# Patient Record
Sex: Female | Born: 1985 | Race: White | Hispanic: No | Marital: Single | Smoking: Never smoker
Health system: Southern US, Community
[De-identification: ages and names within clinical notes are randomized; demographics above are authoritative.]

## PROBLEM LIST (undated history)

## (undated) ENCOUNTER — Inpatient Hospital Stay (HOSPITAL_COMMUNITY): Payer: Self-pay

## (undated) DIAGNOSIS — R42 Dizziness and giddiness: Secondary | ICD-10-CM

## (undated) DIAGNOSIS — G610 Guillain-Barre syndrome: Secondary | ICD-10-CM

## (undated) DIAGNOSIS — E785 Hyperlipidemia, unspecified: Secondary | ICD-10-CM

## (undated) DIAGNOSIS — G43909 Migraine, unspecified, not intractable, without status migrainosus: Secondary | ICD-10-CM

## (undated) DIAGNOSIS — O139 Gestational [pregnancy-induced] hypertension without significant proteinuria, unspecified trimester: Secondary | ICD-10-CM

## (undated) DIAGNOSIS — F418 Other specified anxiety disorders: Secondary | ICD-10-CM

## (undated) HISTORY — DX: Hyperlipidemia, unspecified: E78.5

## (undated) HISTORY — PX: ANKLE SURGERY: SHX546

## (undated) HISTORY — DX: Other specified anxiety disorders: F41.8

## (undated) HISTORY — DX: Migraine, unspecified, not intractable, without status migrainosus: G43.909

## (undated) HISTORY — DX: Gestational (pregnancy-induced) hypertension without significant proteinuria, unspecified trimester: O13.9

## (undated) HISTORY — DX: Guillain-Barre syndrome: G61.0

---
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
## 2005-04-24 ENCOUNTER — Ambulatory Visit: Payer: Self-pay | Admitting: Internal Medicine

## 2006-04-09 ENCOUNTER — Ambulatory Visit: Payer: Self-pay | Admitting: Internal Medicine

## 2007-12-02 DIAGNOSIS — G610 Guillain-Barre syndrome: Secondary | ICD-10-CM

## 2007-12-02 HISTORY — DX: Guillain-Barre syndrome: G61.0

## 2008-01-22 ENCOUNTER — Emergency Department (HOSPITAL_COMMUNITY): Admission: EM | Admit: 2008-01-22 | Discharge: 2008-01-23 | Payer: Self-pay | Admitting: Emergency Medicine

## 2008-01-25 ENCOUNTER — Inpatient Hospital Stay (HOSPITAL_COMMUNITY): Admission: EM | Admit: 2008-01-25 | Discharge: 2008-01-31 | Payer: Self-pay | Admitting: Neurology

## 2008-01-28 ENCOUNTER — Ambulatory Visit: Payer: Self-pay | Admitting: Physical Medicine & Rehabilitation

## 2008-02-02 ENCOUNTER — Encounter: Admission: RE | Admit: 2008-02-02 | Discharge: 2008-03-03 | Payer: Self-pay | Admitting: Neurology

## 2010-06-25 ENCOUNTER — Encounter: Payer: Self-pay | Admitting: Emergency Medicine

## 2010-10-16 NOTE — Discharge Summary (Signed)
[name], [name]              ACCOUNT NO.:  [id number]   MEDICAL RECORD NO.:  [id number]          PATIENT TYPE:  INP   LOCATION:  3007                         FACILITY:  [facility]   PHYSICIAN:  Casimiro Needle L. Reynolds, M.D.DATE OF BIRTH:  1986-02-24   DATE OF ADMISSION:  01/25/2008  DATE OF DISCHARGE:  01/31/2008                               DISCHARGE SUMMARY   ADMISSION DIAGNOSES:  1. Quadriparesis, uncertain etiology, question cervical cord      compression versus Guillain-Barre syndrome.  2. History of migraine.  3. History of gastroesophageal reflux disease.   DISCHARGE DIAGNOSES:  1. 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  2. Postlumbar puncture headache, improved following blood patch      procedure.  3. History of migraine.  4. History of gastroesophageal reflux disease.   CONDITION AT DISCHARGE:  Improved.   DIET:  Regular.   ACTIVITY:  Ad lib, increasing activity slowly.   MEDICATIONS AT DISCHARGE:  1. Yaz as directed.  2. Tylenol No. 3 q.4-6h. p.r.n.  3. Over-the-counter laxative p.r.n.   STUDIES:  1. MRI of the brain performed on January 24, 2008, unremarkable except      for minimal sinus disease.  2. MRI of the cervical spine initially performed on January 24, 2008,      but severely degraded by motion artifact, repeated on January 25, 2008, and demonstrating minimal leftward disk at C5-6 without      significant stenosis and unremarkable cord signal.  3. MRA of the neck performed on January 25, 2008, demonstrating no      stenosis or evidence of dissection within the cervical vasculature.  4. CSF studies on January 25, 2008, 0-1 white blood cells, red blood      cells 1-4, protein normal at 22, glucose normal at 59, and culture      negative.   LABORATORY REVIEW:  CBC on admission:  White count 10.7, hemoglobin  12.7, and platelets 296,000.   CMET on admission normal.  CT on admission  normal.  Sed rate on admission elevated at 35.  White count on January 27, 2008, down to 7.9 and CMET on January 27, 2008, normal.  Heavy metals  negative.   PROCEDURES:  1. Lumbar puncture performed on January 25, 2008, by Dr. Guilford Shi, no      complications.  2. Epidural blood patch performed on January 30, 2008, by Dr. Alfredo Batty,      no complications.   HOSPITAL COURSE:  Please see admission H&P by Dr. Guilford Shi for full  admission details.  Briefly, this is a 25 year old woman who presented  to the emergency department with progressive quadriparesis, which was  treated a few days prior by chiropractic manipulation.  She initially  presented on August 21, was sent out to the ED, then presented on August  23 with worsening symptoms and was admitted.  The initial concern was  regarding a cervical spinal process and she underwent MRI of  the brain  and cervical spine, which demonstrated the above results.  Next, concern  was for Guillain-Barre syndrome, and she underwent an LP as above.  Although her CSF results are not particularly supportive of GBS, it was  felt that this was her diagnosis on clinical grounds, and therapy with  gammaglobulin was initiated.  The patient did much better over the next  few days in terms of her strength, and eventually began to mobilize.  Her mobilization was hampered by the fact she developed postural  headache, which was pretty severe in character and required parenteral  pain medications.  Because of this, she was aggressively hydrated and  eventually underwent a blood patch procedure on August 29, which helped  her headache tremendously.  On the morning of August 30, she was getting  up and ambulating independently, without headache and without  assistance.  She felt that she was moving adequately and getting on and  off to toilet adequately.  She expressed an interest in going home.  It  was felt that she could  perform outpatient therapy, and arrangements  were made for her discharge.   The plan will be to discharge her later today if she is in fact able to  ambulate and feeling well, although discharge may be deferred if she has  any concerns about going home in her present state.   FOLLOWUP:  Outpatient physical and occupational therapy will be arranged  for the patient.  She was asked to call the office at [facility] on February 01, 2008, to arrange follow up with Dr.  Anne Stone and was also advised to follow up with her primary care  physician.      Michael L. Thad Ranger, M.D.  Electronically Signed     MLR/MEDQ  D:  01/31/2008  T:  01/31/2008  Job:  [id number]

## 2010-10-16 NOTE — H&P (Signed)
[name], [name]              ACCOUNT NO.:  [id number]   MEDICAL RECORD NO.:  [id number]          PATIENT TYPE:  EMS   LOCATION:  ED                           [facility]   PHYSICIAN:  Suzzette Righter, DO        DATE OF BIRTH:  [birth year]   DATE OF ADMISSION:  01/24/2008  DATE OF DISCHARGE:                              HISTORY & PHYSICAL   TIME:  1945.   CHIEF COMPLAINT:  Quadriparesis.   HISTORY OF PRESENT ILLNESS:  The patient is a 25 year old Caucasian  female seen in neurological assessment at [facility] emergency  department secondary to quadriparesis.  History is obtained from the  patient, her parents, and review of the electronic medical record.  According to the patient, she was diagnosed with migraine cephalgia  approximately 4 months ago at an outside hospital emergency department.  At that time she had loss of her peripheral vision in the right eye as  well as a right-sided headache.  She indicates that she was referred to  neurologist; however, due to the fact that she did not have any  additional headaches during the time of her 30-day referral she did not  follow up with any neurologist.  Patient subsequently had a second  migraine headache characterized again as right-sided head paine, and  subsequently a bilateral, sharp headache with bilateral visual  disturbance characterized by flashing lights (scintillating scotomas).  This occurred in July of 2009.  She then saw her family physician in  order to obtain a referral for a neurologist.  However, the patient was  referred to a chiropractor.  The patient saw the chiropractor on August  19th for an initial visit and assessment.  At that time, the patient  obtained a series of plain x-rays of her C/T/L spine and pelvis.  She  returned to the chiropractor on August 21, at which time she was treated  with muscle manipulation as well as needless acupuncture.  The patient  denies any high velocity movements of  the neck or back.  She does,  however, indicate that her treatments focussed on her hands as well as  her cervical spine and mid back.  The patient went home after her 9 a.m.  appointment with the chiropractor.  She noted at approximately 1 p.m.,  while using a keyboard, that she had bilateral hand paresthesias.  She  indicated that she had difficulty coordinating her fingers to use a  keyboard while typing.  Symptoms were concerning to her, however the  patient waited to return to the chiropractor on January 22, 2008, to  discuss her new symptoms.  The patient indicates that her chiropractor  reported to her that her symptoms were attributed to toxins being  released from her muscles into her blood stream.  He apparently  indicated to her that she should drink copious amounts of water to  relieve the symptoms.  Later that afternoon the patient noted bilateral  lower extremity paraparesis.  She indicates that she had a difficult  time walking and even fell once.  She then was brought by family  to the  [facility] Emergency Room.  While in the [facility] Emergency Room,  the patient obtained an MRI of the brain and cervical spine.  The MRI of  the brain was essentially unremarkable.  The MRI of the cervical spine  was motion degraded as the patient began to cough, primarily from her  acid reflux while lying in the supine position.  An MRA of the neck was  not obtained due to the patient aborting the study.  Patient remained in  the emergency room for approximately 1 day and was discharged in the  early morning hours of August 22.  The patient subsequently fell at home  and was essentially nonambulatory.  She returned to the Emergency  Department at [facility] on January 24, 2008, accompanied by her  parents.  Neurology was asked to assess the patient and to make further  recommendation.  Neurology was contacted at approximately 7:40 p.m. and  evaluated the patient approximately 10 minutes  later.   PAST MEDICAL HISTORY:  1. Recent diagnosis of migraine cephalgia (common migraine/migraine      with aura).  Patient with a history of 2 headaches since April of      2009.  Both headaches have been aborted in the past with      sumatriptan.  Patient also with tension headaches approximately 2-3      times per week which are aborted with 1-2 tablets of Excedrin      Migraine.  Patient denies any complex migraine associated with any      weakness or sensory changes in her extremities.  2. Irritable bowel syndrome.  3. GERD.   PAST SURGICAL HISTORY:  Left ankle ORIF.   OUTPATIENT MEDICATIONS:  1. Yaz (birth control pill) once daily.  2. Aciphex on a p.r.n. basis.  3. Hyomax-SR on a p.r.n. basis.  4. Sumatriptan p.r.n. headache.  5. Glycopyrrolate p.r.n. nausea.  6. Excedrin Migraine 1-2 tablets p.r.n. tension headache.   ALLERGIES:  Are listed as no known drug allergies.   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  She does not consume illicit drugs or tobacco.  She  does drink alcohol on occasion.  She is independent of activities of  daily living and does not use any assistive devices for ambulation at  baseline.   FAMILY HISTORY:  Denied for a history of migraine cephalgia.  Patient  with multiple sisters and half-sisters.  There is no family history of  any neurological disorder.   LAST MENSTRUAL PERIOD:  December 28, 2007.   REVIEW OF SYSTEMS:  At present time, patient reports a very mild  bifrontal headache.  She denies any vision disturbance.  She does report  bilateral lower extremity weakness.  She denies any pain in her  extremities.  She does report bilateral hand weakness as well.  She  denies nausea or emesis.  She denies any recent flu-like illness.  She  does report one episode of nausea and emesis about 1 week ago, which is  common for the patient given her history of IBS.  She denies bowel or  bladder  incontinence.  She denies saddle anesthesia.  She does report  that she did fall on her bottom, however she did obtain plain films of  her pelvis which were reported as unremarkable.  No chest pain or  shortness of breath is noted.  Patient denies currently having migraine  symptomatology.  PHYSICAL EXAM:  GENERAL:  The patient is awake and alert.  She is  pleasant and cooperative.  She is in no acute distress but is concerned  about her condition.  VITAL SIGNS:  Most recent vital signs from 2025 are temperature 97.8,  blood pressure 132/79, pulse 111, respirations 20, pulse ox is 98% on  room air.  HEENT EXAM:  Patient is normocephalic and atraumatic.  Oral mucosa is  moist.  There are no tongue lacerations present.  There are no carotid  bruits present.  Palpation of the patient's cervical spine does elicit  bilateral lower extremity paresthesias primarily in the lateral and  posterior parts of her thighs.  Additionally, active neck rotation to  the right does elicit bilateral lower extremity paresthesias.  Patient,  however, does have full range of motion with regards to her neck in all  planes.  HEART:  Regular but tachycardic.  No murmurs were noted.  LUNGS:  Clear to auscultation bilaterally.  ABDOMEN:  Soft, nontender, nondistended.  EXTREMITIES:  Without any edema or cyanosis.  SKIN:  Without any evidence of a rash.  There is no ecchymosis on the  patient's back.  There is mild ecchymosis of the left knee attributed to  when the patient fell earlier today.  BACK:  Without any evidence of tenderness on palpation of the patient's  entire lower cervical, thoracic, and lumbar spine.   NEUROLOGICAL EXAM:  MENTAL STATUS:  Patient is awake and alert.  Her  speech is fluent without any dysarthria or dysphagia.  She is an  excellent historian.  She follows all commands appropriately.  CRANIAL NERVE EXAM:  Pupils are equally round and reactive to light  bilaterally.  Extraocular  muscle movements are intact bilaterally.  Left  eye ptosis is noted which may be chronic as it does appear to be present  on the patient's driver's license photo.  There is no nystagmus noted  with extraocular muscle testing.  Facial sensation symmetrically intact  to light touch, pin and temperature sensation.  Muscles of facial  expression are intact bilaterally.  There are no gross hearing deficits.  Palate elevates symmetrically.  Sternocleidomastoid and trapezius  strength are full.  Tongue protrudes midline without any lacerations.  MOTOR EXAM:  There is no resting or action tremor.  There is no pronator  drift.  Shoulder abduction is 5/5 bilaterally.  Elbow flexion is 4/5 on  the left and 4+/5 on the right.  Elbow extension is 4-/5 bilaterally.  Wrist extension is 3/5 bilaterally.  Wrist flexion is 4/5 bilaterally.  Intrinsic hand muscle strength is graded at 3/5 bilaterally.  Abductor  pollicis brevis strength is 3/5 bilaterally.  Finger flexion in the  median distribution is 3/5 and in the ulnar distribution is also 3/5  bilaterally.  Hip flexion is 3/5 bilaterally.  Ankle dorsiflexion is 2/5  bilaterally.  Ankle plantar flexion is 4-/5 bilaterally.  No  fasciculations or spontaneous movements were noted.  REFLEXES:  Biceps, triceps and brachial radialis are 2+/4 bilaterally.  Patellar and Achilles reflexes are absent bilaterally.  Hoffmann's  response is present bilaterally.  SENSORY EXAM:  No anterior or posterior spinal sensory level was noted  with assessment of the patient's chest and abdomen as well as her back.  Sensation to pin stimulation was diminished in the distal upper  extremities as compared to the proximal upper extremities as well as in  the distal lower extremities as compared to the proximal lower  extremities.  Vibratory  sensation was diminished in the bilateral lower  extremities by approximately 20% as compared to the upper extremities.  Light touch was  symmetrically preserved in the bilateral upper and lower  extremities.  CEREBELLAR EXAM:  Finger-to-nose and rapid alternating movements were  intact bilaterally.  There was no dysdiadochokinesia.   LABORATORY ASSESSMENT:  Sed rate was 35, which is slightly elevated, CK  was within normal limits at 59, sodium 137, potassium 4.4, chloride 103,  CO2 24, glucose 96, BUN 7, creatinine 0.67, calcium 9.6.  AST 24, ALT  21, alkaline phosphatase 54.  Hemoglobin 13.9, hematocrit 41.   IMAGING STUDIES:  Patient obtained an MRI of the brain, without  gadolinium enhancement, on January 24, 2008.  A formal report is pending,  however the preliminary report indicates that it is normal.  The study  was personally reviewed and felt to not reflect any intracranial  pathology.  An MRI of the cervical spine was also attempted on January 24, 2008.  This was motion-limited as the patient began to cough when  lying in a supine position.  Preliminary personal evaluation of the  study seems to suggest a posterior disk at C5-C6 as well as C6-C7,  however not all of the sequences are available for viewing and the  images are highly degraded.  Additional imaging studies include a  complete view lumbar spine x-ray which was negative for fracture on  January 24, 2008.  X-ray of the thoracic spine on January 24, 2008, was  also negative for fracture.  CT of the cervical spine and lumbar spines,  without contrast, were reported as no acute process.   IMPRESSION:  1. The patient is a 25 year old Caucasian female with a 48-month      history of common migraine cephalgia (migraine with aura).  Patient      with 2 migraines since diagnosis 4 months ago.  On August 20,      patient was seen by a chiropractor who provided muscle manipulation      at approximately 0900.  By 1300 on that same day the patient noted      tingling, heaviness, and loss of coordination in her bilateral      hands when attempting to type on a  keyboard.  Patient returned to      the chiropractor on August 21 who attributed her symptoms to toxins      being released into her blood stream from the manipulation.      Patient was advised to drink lots of water.  When the patient's      symptoms involved her lower extremities she became concerned and      presented to [facility] emergency department on January 22, 2008.      Patient was noted to have quadriparesis and the inability to      ambulate.  MRI of the brain was obtained which was unremarkable.      MRI of the C-spine was motion degraded.  Patient was discharged      home on August 22 but returned to [facility] after falling on      August 23.  Neurological examination at present reveals awake,      alert, pleasant, cooperative patient with 5/5 shoulder abduction,      4/5 elbow flexion on the left, 4+/5 elbow flexion on the right, 3/5      wrist extension bilaterally, 3/5 intrinsic hand muscles      bilaterally, 3/5 bilateral hip flexion,  2/5 bilateral dorsiflexion,      4-/5 bilateral plantar flexion.  No spinal sensory level is noted      anteriorly or posteriorly.  Absent lower extremity reflexes, 2+/4      bilateral biceps, triceps and brachial radialis reflexes noted.      Positive Hoffmann's bilaterally.  Patient's lower extremity      paresthesia can be reproduced with deep palpation of the C-spine or      active right neck rotation.  Left eye ptosis also noted (question      chronic).  Decreased vibratory sensation in the distal bilateral      lower extremities compared to the upper extremities.  Decreased pin      sensation in distal extremities in the upper and lower extremities      as compared to the proximal.  Possible etiologies include:      a.     Cervical cord compression given upper extremity and lower       extremity involvement.  Absence of  lower extremity reflexes may       be due to relative acuity of the events.      b.     Doubt Horner syndrome  on the left from carotid dissection,       although left eye ptosis is present.      c.     Concomitant multi-level radiculopathy with cervical       myelpathy is less likely.      d.     Guillian-Barre Syndrome, given lower extremity arreflexia,       although reproducibility of symptoms would  suggest otherwise.  2. Migraine cephalgia by history.  Currently patient with minimal     bifrontal headache, typical for her tension headaches.  3. Medical history of gastroesophageal reflux disease and irritable      bowel syndrome.   PLAN:  1. Transfer patient to [facility] as recommended by attending      radiologist (Dr. Azucena Kuba) to facilitate stat MRI of the C-spine and      MRA of the neck.  2. Admit patient to Dr. Anne Hahn.  3. Will premedicate with Phenergan to prevent coughing from acid      reflux when patient is laid in the supine position.  4. Very lengthy discussion with family and patient.  All questions      were answered.  Family was in agreement with the plans to transfer      the patient in order to obtain additional imaging studies.  5. Will follow up with patient following arrival at [facility].  6. Patient may need to have a lumbar puncture to exclude Guillian-      Barre Syndrome if C-spine imaging does not provide the answers.      Suzzette Righter, DO  Electronically Signed     RH/MEDQ  D:  01/24/2008  T:  01/25/2008  Job:  [id number]   cc:   Marlan Palau, M.D.  Fax: [phone]

## 2011-05-09 ENCOUNTER — Ambulatory Visit (INDEPENDENT_AMBULATORY_CARE_PROVIDER_SITE_OTHER): Payer: BC Managed Care – PPO

## 2011-05-09 DIAGNOSIS — J111 Influenza due to unidentified influenza virus with other respiratory manifestations: Secondary | ICD-10-CM

## 2011-05-28 ENCOUNTER — Ambulatory Visit (INDEPENDENT_AMBULATORY_CARE_PROVIDER_SITE_OTHER): Payer: BC Managed Care – PPO

## 2011-05-28 DIAGNOSIS — H60339 Swimmer's ear, unspecified ear: Secondary | ICD-10-CM

## 2011-09-23 ENCOUNTER — Ambulatory Visit: Payer: BC Managed Care – PPO

## 2011-09-23 ENCOUNTER — Ambulatory Visit (INDEPENDENT_AMBULATORY_CARE_PROVIDER_SITE_OTHER): Payer: BC Managed Care – PPO | Admitting: Family Medicine

## 2011-09-23 VITALS — BP 131/86 | HR 100 | Temp 98.9°F | Resp 16 | Ht 67.0 in | Wt 217.8 lb

## 2011-09-23 DIAGNOSIS — IMO0002 Reserved for concepts with insufficient information to code with codable children: Secondary | ICD-10-CM

## 2011-09-23 DIAGNOSIS — M549 Dorsalgia, unspecified: Secondary | ICD-10-CM

## 2011-09-23 MED ORDER — CYCLOBENZAPRINE HCL 5 MG PO TABS: 5.0000 mg | ORAL_TABLET | Freq: Every evening | ORAL | Status: AC | PRN

## 2011-09-23 MED ORDER — NAPROXEN 500 MG PO TABS: 500.0000 mg | ORAL_TABLET | Freq: Two times a day (BID) | ORAL | Status: AC

## 2011-09-23 MED ORDER — TRAMADOL HCL 50 MG PO TABS: 50.0000 mg | ORAL_TABLET | Freq: Three times a day (TID) | ORAL | Status: AC | PRN

## 2011-09-23 NOTE — Progress Notes (Signed)
[facility]:  Office Visit  Chief Complaint:  Chief Complaint  Patient presents with  . Back Pain    2 days    HPI: [name] is a 26 y.o. female who complains of  2 day h/o back pain with radiation, no numbness or tingling or weakness. H/o of being Horticulturist, commercial. Was wearing a 10 lb back leaf blowing devise and was working on yard blowing leaves when occurred. She had a prior h/o back pain 2 weeks ago and this lasted only 6 hrs, this current episode  now this has been 2 days. Tried Ibuproen without relief. Increase movement and ROM worsens pain. No radicular pain, just localized to back. H.o Guillan-Barre. Denies numbness, weakness, tngling, or loss of bowel and bladder function   Past Medical History  Diagnosis Date  . Guillain-Barre    History reviewed. No pertinent past surgical history. History   Social History  . Marital Status: Single    Spouse Name: N/A    Number of Children: N/A  . Years of Education: N/A   Social History Main Topics  . Smoking status: Never Smoker   . Smokeless tobacco: None  . Alcohol Use: No  . Drug Use: No  . Sexually Active: None   Other Topics Concern  . None   Social History Narrative  . None   No family history on file. No Known Allergies Prior to Admission medications   Medication Sig Start Date End Date Taking? Authorizing Provider  cetirizine (ZYRTEC) 10 MG tablet Take 10 mg by mouth daily.   Yes Historical Provider, MD  drospirenone-ethinyl estradiol (YAZ,GIANVI,LORYNA) 3-0.02 MG tablet Take 1 tablet by mouth daily.   Yes Historical Provider, MD  glycopyrrolate (ROBINUL) 2 MG tablet Take 1 mg by mouth daily.   Yes Historical Provider, MD  Multiple Vitamins-Minerals (ADULT GUMMY PO) Take by mouth daily.   Yes Historical Provider, MD  Probiotic Product (PROBIOTIC PO) Take by mouth daily.   Yes Historical Provider, MD     ROS: The patient denies fevers, chills, night sweats, unintentional weight loss, chest  pain, palpitations, wheezing, dyspnea on exertion, nausea, vomiting, abdominal pain, dysuria, hematuria, melena, numbness, weakness, or tingling.+ back pain  All other systems have been reviewed and were otherwise negative with the exception of those mentioned in the HPI and as above.    PHYSICAL EXAM: Filed Vitals:   09/23/11 1656  BP: 131/86  Pulse: 105  Temp: 98.9 F (37.2 C)  Resp: 16   Filed Vitals:   09/23/11 1656  Height: 5\' 7"  (1.702 m)  Weight: 217 lb 12.8 oz (98.793 kg)   Body mass index is 34.11 kg/(m^2).  General: Alert, no acute distress HEENT:  Normocephalic, atraumatic, oropharynx patent.  Cardiovascular:  Regular rate and rhythm, no rubs murmurs or gallops.  No Carotid bruits, radial pulse intact. No pedal edema.  Respiratory: Clear to auscultation bilaterally.  No wheezes, rales, or rhonchi.  No cyanosis, no use of accessory musculature GI: No organomegaly, abdomen is soft and non-tender, positive bowel sounds.  No masses. Skin: No rashes. Neurologic: Facial musculature symmetric. Psychiatric: Patient is appropriate throughout our interaction. Lymphatic: No cervical lymphadenopathy Musculoskeletal: Gait intact. ROM intact, 5/5 stength, 2/2 DTrs of knee and ankles + tender mid back at L-4-5   LABS: No results found for this or any previous visit.   EKG/XRAY:   Primary read interpreted by Dr. Conley Rolls at [facility]. Normal l-spine xrays   ASSESSMENT/PLAN: Encounter Diagnoses  Name Primary?  Marland Kitchen  Back pain Yes  . Back sprain or strain    EA:[id number], Flexeril, and Tramadol Back exercises given F/u in 2-4 weeks if no improvement    Hannia Matchett PHUONG, DO 09/23/2011 5:23 PM

## 2011-09-23 NOTE — Patient Instructions (Signed)
Back Exercise

## 2012-01-10 ENCOUNTER — Other Ambulatory Visit: Payer: Self-pay | Admitting: Family Medicine

## 2014-11-18 ENCOUNTER — Other Ambulatory Visit: Payer: Self-pay | Admitting: Family Medicine

## 2014-11-18 DIAGNOSIS — E049 Nontoxic goiter, unspecified: Secondary | ICD-10-CM

## 2014-11-25 ENCOUNTER — Ambulatory Visit
Admission: RE | Admit: 2014-11-25 | Discharge: 2014-11-25 | Disposition: A | Discharge status: Home / Self Care | Payer: BC Managed Care – PPO | Source: Ambulatory Visit | Attending: Family Medicine | Admitting: Family Medicine

## 2014-11-25 DIAGNOSIS — E049 Nontoxic goiter, unspecified: Secondary | ICD-10-CM

## 2016-07-17 ENCOUNTER — Encounter (HOSPITAL_COMMUNITY): Payer: Self-pay

## 2016-07-17 ENCOUNTER — Emergency Department (HOSPITAL_COMMUNITY): Payer: BC Managed Care – PPO

## 2016-07-17 ENCOUNTER — Emergency Department (HOSPITAL_COMMUNITY)
Admission: EM | Admit: 2016-07-17 | Discharge: 2016-07-18 | Disposition: A | Discharge status: Home / Self Care | Payer: BC Managed Care – PPO | Attending: Emergency Medicine | Admitting: Emergency Medicine

## 2016-07-17 DIAGNOSIS — R200 Anesthesia of skin: Secondary | ICD-10-CM | POA: Diagnosis not present

## 2016-07-17 DIAGNOSIS — J188 Other pneumonia, unspecified organism: Secondary | ICD-10-CM | POA: Insufficient documentation

## 2016-07-17 DIAGNOSIS — R21 Rash and other nonspecific skin eruption: Secondary | ICD-10-CM | POA: Diagnosis not present

## 2016-07-17 DIAGNOSIS — J189 Pneumonia, unspecified organism: Secondary | ICD-10-CM

## 2016-07-17 DIAGNOSIS — R0602 Shortness of breath: Secondary | ICD-10-CM | POA: Diagnosis present

## 2016-07-17 LAB — CBC
HCT: 37.3 % (ref 36.0–46.0)
HEMOGLOBIN: 12.5 g/dL (ref 12.0–15.0)
MCH: 28.9 pg (ref 26.0–34.0)
MCHC: 33.5 g/dL (ref 30.0–36.0)
MCV: 86.1 fL (ref 78.0–100.0)
PLATELETS: 376 10*3/uL (ref 150–400)
RBC: 4.33 MIL/uL (ref 3.87–5.11)
RDW: 13.6 % (ref 11.5–15.5)
WBC: 11.1 10*3/uL — AB (ref 4.0–10.5)

## 2016-07-17 LAB — COMPREHENSIVE METABOLIC PANEL
ALK PHOS: 61 U/L (ref 38–126)
ALT: 23 U/L (ref 14–54)
AST: 32 U/L (ref 15–41)
Albumin: 3.8 g/dL (ref 3.5–5.0)
Anion gap: 13 (ref 5–15)
BUN: 8 mg/dL (ref 6–20)
CHLORIDE: 102 mmol/L (ref 101–111)
CO2: 22 mmol/L (ref 22–32)
CREATININE: 0.63 mg/dL (ref 0.44–1.00)
Calcium: 9.8 mg/dL (ref 8.9–10.3)
GFR calc Af Amer: >60 mL/min (ref >60)
GFR calc non Af Amer: >60 mL/min (ref >60)
Glucose, Bld: 89 mg/dL (ref 65–99)
Potassium: 3.8 mmol/L (ref 3.5–5.1)
Sodium: 137 mmol/L (ref 135–145)
Total Bilirubin: 0.5 mg/dL (ref 0.3–1.2)
Total Protein: 7.9 g/dL (ref 6.5–8.1)

## 2016-07-17 LAB — D-DIMER, QUANTITATIVE: D-Dimer, Quant: 0.63 ug/mL-FEU — ABNORMAL HIGH (ref 0.00–0.50)

## 2016-07-17 LAB — POC URINE PREG, ED: Preg Test, Ur: NEGATIVE

## 2016-07-17 LAB — MAGNESIUM: Magnesium: 1.9 mg/dL (ref 1.7–2.4)

## 2016-07-17 MED ORDER — IOPAMIDOL (ISOVUE-370) INJECTION 76%
INTRAVENOUS | Status: AC
  Administered 2016-07-17: 100 mL
  Filled 2016-07-17: qty 100

## 2016-07-17 NOTE — ED Provider Notes (Signed)
[facility]-EMERGENCY DEPT Provider Note   CSN: [id number] Arrival date & time: 07/17/16  1541     History   Chief Complaint Chief Complaint  Patient presents with  . Numbness    HPI [name] is a 31 y.o. female.  HPI Pt reports she woke up this morning with numbness bilaterally in her hands. 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 She reports she recently had the flu and has been sick ever since.   Patient states her symptoms started in January. She was treated with Tamiflu for the flu. After that she developed a right lymph node infection and was prescribed amoxicillin. Subsequently after completing that treatment she went to urgent care as she was having a prominent cough and was prescribed with another round of amoxicillin which she is currently taking. Since then, she developed some shortness of breath and chest discomfort. She states she feels shaky and, "like I've ran a marathon". She states that her extremities all feel cold and hot and different. She feels weak for about 1 day. Her symptoms did not start in the lower extremities and then go up that they have instead started in the bilateral upper extremities and then gone down. She states she feels very anxious as last time when she had gamma ray she could not walk. No diarrheal illness recently.   Review of 2012 records show pt ws presumably dx with GBS although had negative CSF studies. Pt never developed respiratory complications and did not have to be intubated.   Past Medical History:  Diagnosis Date  . Guillain-Barre (HCC)     There are no active problems to display for this patient.   History reviewed. No pertinent surgical history.  OB History    No data available       Home Medications    Prior to Admission medications   Medication Sig Start Date End Date Taking? Authorizing Provider  amoxicillin-clavulanate (AUGMENTIN) 875-125 MG tablet Take 1 tablet by mouth 2 (two) times daily.   Yes Historical  Provider, MD  atorvastatin (LIPITOR) 20 MG tablet Take 20 mg by mouth daily.   Yes Historical Provider, MD  cetirizine (ZYRTEC) 10 MG tablet Take 10 mg by mouth daily.   Yes Historical Provider, MD  drospirenone-ethinyl estradiol (YAZ,GIANVI,LORYNA) 3-0.02 MG tablet Take 1 tablet by mouth daily.   Yes Historical Provider, MD  escitalopram (LEXAPRO) 20 MG tablet Take 20 mg by mouth daily.   Yes Historical Provider, MD  fenofibrate 54 MG tablet Take 54 mg by mouth daily.   Yes Historical Provider, MD  glycopyrrolate (ROBINUL) 2 MG tablet Take 3 mg by mouth daily.    Yes Historical Provider, MD  Multiple Vitamin (MULTIVITAMIN WITH MINERALS) TABS tablet Take 1 tablet by mouth daily.   Yes Historical Provider, MD  Probiotic Product (PROBIOTIC PO) Take 1 tablet by mouth daily.    Yes Historical Provider, MD  doxycycline (VIBRAMYCIN) 100 MG capsule Take 1 capsule (100 mg total) by mouth 2 (two) times daily. 07/17/16   Sidney Ace, MD    Family History History reviewed. No pertinent family history.  Social History Social History  Substance Use Topics  . Smoking status: Never Smoker  . Smokeless tobacco: Never Used  . Alcohol use No     Allergies   Patient has no known allergies.   Review of Systems Review of Systems  Constitutional: Negative for fever.  Gastrointestinal: Negative for diarrhea (baseline IBS) and vomiting.  Allergic/Immunologic: Negative for immunocompromised state.  All other systems reviewed and are negative.    Physical Exam Updated Vital Signs BP (!) 139/120   Pulse 118   Temp 97.6 F (36.4 C) (Oral)   Resp 16   SpO2 97%   Physical Exam  Constitutional: She appears well-developed and well-nourished. No distress.  HENT:  Head: Normocephalic and atraumatic.  Eyes: Conjunctivae are normal.  Neck: Neck supple.  Cardiovascular: Normal rate and regular rhythm.   No murmur heard. Pulmonary/Chest: Effort normal and breath sounds normal. No respiratory  distress. She has no wheezes.  Abdominal: Soft. There is no tenderness.  Musculoskeletal: She exhibits no edema.  Neurological: She is alert. She has normal strength and normal reflexes. She displays no atrophy, no tremor and normal reflexes. No cranial nerve deficit or sensory deficit (pt states feels different than usual but feels me touching everywhere). She exhibits normal muscle tone.  Skin: Skin is warm and dry. Rash noted.  Pt has a nonraised erythematous rash along her back and anterior chest, neck which she states is new, not pruritic  Psychiatric: She has a normal mood and affect.  Nursing note and vitals reviewed.    ED Treatments / Results  Labs (all labs ordered are listed, but only abnormal results are displayed) Labs Reviewed  CBC - Abnormal; Notable for the following:       Result Value   WBC 11.1 (*)    All other components within normal limits  D-DIMER, QUANTITATIVE (NOT AT [facility]) - Abnormal; Notable for the following:    D-Dimer, Quant 0.63 (*)    All other components within normal limits  COMPREHENSIVE METABOLIC PANEL  MAGNESIUM  POC URINE PREG, ED    EKG  EKG Interpretation  Date/Time:  Wednesday July 17 2016 18:33:32 EST Ventricular Rate:  113 PR Interval:    QRS Duration: 85 QT Interval:  323 QTC Calculation: 443 R Axis:   47 Text Interpretation:  Sinus tachycardia Nonspecific T wave abnormality Confirmed by Denton LankSTEINL  MD, Caryn BeeKEVIN ([id number]) on 07/17/2016 7:32:30 PM       Radiology Dg Chest 2 View  Result Date: 07/17/2016 CLINICAL DATA:  Short of breath EXAM: CHEST  2 VIEW COMPARISON:  None. FINDINGS: The heart size and mediastinal contours are within normal limits. Both lungs are clear. The visualized skeletal structures are unremarkable. IMPRESSION: No active cardiopulmonary disease. Electronically Signed   By: Marlan Palauharles  Clark M.D.   On: 07/17/2016 20:11   Ct Angio Chest Pe W Or Wo Contrast  Result Date: 07/17/2016 CLINICAL DATA:  Shortness of breath  for 5 days. Ongoing cough for 7 days. EXAM: CT ANGIOGRAPHY CHEST WITH CONTRAST TECHNIQUE: Multidetector CT imaging of the chest was performed using the standard protocol during bolus administration of intravenous contrast. Multiplanar CT image reconstructions and MIPs were obtained to evaluate the vascular anatomy. CONTRAST:  100 mL Isovue 370 COMPARISON:  None. FINDINGS: Cardiovascular: Good opacification of the central and segmental pulmonary arteries. No focal filling defects. No evidence of significant pulmonary embolus. Heart size is normal. No pericardial effusion. Normal caliber thoracic aorta. No aortic dissection. Great vessel origins are patent. Mediastinum/Nodes: Mediastinal lymph nodes are not pathologically enlarged. Esophagus is decompressed. Lungs/Pleura: Evaluation of the lungs is limited due to respiratory motion artifact. There is patchy airspace disease in the right upper lung suggesting pneumonia. Small right pleural effusion. Slight mosaic attenuation pattern to the right lower lung could represent alveolar infiltration, edema, or motion artifact. No pneumothorax. Left lung is clear. Upper Abdomen: No acute abnormality. Musculoskeletal:  No chest wall abnormality. No acute or significant osseous findings. Review of the MIP images confirms the above findings. IMPRESSION: No evidence of significant pulmonary embolus. Patchy airspace infiltration in the right upper lung with right pleural effusion suggesting pneumonia. Electronically Signed   By: Burman Nieves M.D.   On: 07/17/2016 23:37    Procedures Procedures (including critical care time)  Medications Ordered in ED Medications  iopamidol (ISOVUE-370) 76 % injection (100 mLs  Contrast Given 07/17/16 2310)  doxycycline (VIBRA-TABS) tablet 100 mg (100 mg Oral Given 07/18/16 0015)     Initial Impression / Assessment and Plan / ED Course  I have reviewed the triage vital signs and the nursing notes.  Pertinent labs & imaging results  that were available during my care of the patient were reviewed by me and considered in my medical decision making (see chart for details).     Pt has a rash that appears most likely secondary to anxiety, flushing. However, in setting of SOB, diffuse rash, paresthesias - will advise to dc amoxicillin. CXR negative but CT PA obtained wo PE but ?PNA. Was very tachycardic, tachypnic and with leukocytosis - will rx for CAP now with doxy. Do not suspect GBS, intracranial or cervical pathology for numbness - neuro exam is reassuring. EKG reassuring, doubt ACS. Once pt became less anxious, sx resolved and looks well, VS normalized. Will have pt closely fu with pcp and strict return precautions. DC in good condition, fu with pcp.   Final Clinical Impressions(s) / ED Diagnoses   Final diagnoses:  SOB (shortness of breath)  Community acquired pneumonia, unspecified laterality    New Prescriptions Discharge Medication List as of 07/18/2016 12:03 AM    START taking these medications   Details  doxycycline (VIBRAMYCIN) 100 MG capsule Take 1 capsule (100 mg total) by mouth 2 (two) times daily., Starting Wed 07/17/2016, Print         Sidney Ace, MD 07/18/16 1610    Cathren Laine, MD 07/23/16 [phone]

## 2016-07-17 NOTE — ED Triage Notes (Signed)
Pt reports she woke up this morning with numbness bilaterally in her hands. 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 She reports she recently had the flu and has been sick ever since.

## 2016-07-18 MED ORDER — DOXYCYCLINE HYCLATE 100 MG PO CAPS
100.0000 mg | ORAL_CAPSULE | Freq: Two times a day (BID) | ORAL | 0 refills | Status: DC
Start: 2016-07-17 — End: 2017-11-26

## 2016-07-18 MED ORDER — DOXYCYCLINE HYCLATE 100 MG PO TABS
100.0000 mg | ORAL_TABLET | Freq: Once | ORAL | Status: AC
  Administered 2016-07-18: 100 mg via ORAL
  Filled 2016-07-18: qty 1

## 2016-07-25 ENCOUNTER — Emergency Department (HOSPITAL_COMMUNITY)
Admission: EM | Admit: 2016-07-25 | Discharge: 2016-07-25 | Disposition: A | Discharge status: Home / Self Care | Payer: BC Managed Care – PPO | Attending: Emergency Medicine | Admitting: Emergency Medicine

## 2016-07-25 ENCOUNTER — Encounter (HOSPITAL_COMMUNITY): Payer: Self-pay

## 2016-07-25 ENCOUNTER — Emergency Department (HOSPITAL_COMMUNITY): Payer: BC Managed Care – PPO

## 2016-07-25 DIAGNOSIS — J309 Allergic rhinitis, unspecified: Secondary | ICD-10-CM

## 2016-07-25 DIAGNOSIS — R05 Cough: Secondary | ICD-10-CM

## 2016-07-25 DIAGNOSIS — R059 Cough, unspecified: Secondary | ICD-10-CM

## 2016-07-25 DIAGNOSIS — Z79899 Other long term (current) drug therapy: Secondary | ICD-10-CM | POA: Insufficient documentation

## 2016-07-25 LAB — I-STAT BETA HCG BLOOD, ED (MC, WL, AP ONLY)

## 2016-07-25 MED ORDER — BENZONATATE 100 MG PO CAPS
200.0000 mg | ORAL_CAPSULE | Freq: Once | ORAL | Status: AC
  Administered 2016-07-25: 200 mg via ORAL
  Filled 2016-07-25: qty 2

## 2016-07-25 MED ORDER — NAPROXEN 500 MG PO TABS: 500.0000 mg | ORAL_TABLET | Freq: Two times a day (BID) | ORAL | 0 refills | Status: DC

## 2016-07-25 MED ORDER — IBUPROFEN 800 MG PO TABS
800.0000 mg | ORAL_TABLET | Freq: Once | ORAL | Status: AC
  Administered 2016-07-25: 800 mg via ORAL
  Filled 2016-07-25: qty 1

## 2016-07-25 MED ORDER — FLUTICASONE PROPIONATE 50 MCG/ACT NA SUSP: 2.0000 | Freq: Every day | NASAL | 0 refills | Status: DC

## 2016-07-25 MED ORDER — BENZONATATE 100 MG PO CAPS: 100.0000 mg | ORAL_CAPSULE | Freq: Three times a day (TID) | ORAL | 0 refills | Status: DC | PRN

## 2016-07-25 MED ORDER — LORATADINE 10 MG PO TABS: 10.0000 mg | ORAL_TABLET | Freq: Every day | ORAL | 0 refills | Status: DC

## 2016-07-25 NOTE — ED Notes (Signed)
Pt states she understands instructions. Pt home stable with Mom with steady gait.

## 2016-07-25 NOTE — ED Provider Notes (Signed)
[facility]PT Provider Note   CSN: [id number] Arrival date & time: 07/25/16  4098     History   Chief Complaint Chief Complaint  Patient presents with  . Cough    HPI [name] is a 31 y.o. female.  [name] is a 31 y.o. Female who presents to the ED complaining of left lower chest wall pain after coughing today. The patient was seen in the emergency department 8 days ago and was diagnosed with pneumonia. At this time she had a CT angiogram of her chest was negative for PE. Patient reports she's had lots of coughing since and has been having some slight pain in her left lateral lower chest wall. She reports today after also coughing she felt a pop and worsening pain. She reports the pain is worse with coughing and palpation of the area. She denies any chest pain or shortness of breath. She tells me generally she is feeling much better and has improved greatly. She has 1 more day of doxycycline after today. She tells me she is still having rhinorrhea and postnasal drip. She received 100 mg of fentanyl by EMS in route. Patient denies fevers, shortness of breath, chest pain, abdominal pain, nausea, vomiting, back pain, urinary symptoms, hematuria, difficulty urinating, lightheadedness, syncope or rashes.   The history is provided by the patient and medical records. No language interpreter was used.  Flank Pain  Pertinent negatives include no chest pain, no abdominal pain, no headaches and no shortness of breath.    Past Medical History:  Diagnosis Date  . Guillain-Barre (HCC)     There are no active problems to display for this patient.   Past Surgical History:  Procedure Laterality Date  . ANKLE SURGERY Left     OB History    No data available       Home Medications    Prior to Admission medications   Medication Sig Start Date End Date Taking? Authorizing Provider  atorvastatin (LIPITOR) 20 MG tablet Take 20 mg by mouth daily.   Yes Historical  Provider, MD  doxycycline (VIBRAMYCIN) 100 MG capsule Take 1 capsule (100 mg total) by mouth 2 (two) times daily. 07/17/16  Yes Sidney Ace, MD  drospirenone-ethinyl estradiol Pierre Bali) 3-0.02 MG tablet Take 1 tablet by mouth daily.   Yes Historical Provider, MD  escitalopram (LEXAPRO) 20 MG tablet Take 20 mg by mouth daily.   Yes Historical Provider, MD  fenofibrate 54 MG tablet Take 54 mg by mouth daily.   Yes Historical Provider, MD  glycopyrrolate (ROBINUL) 2 MG tablet Take 3 mg by mouth daily.    Yes Historical Provider, MD  Multiple Vitamin (MULTIVITAMIN WITH MINERALS) TABS tablet Take 1 tablet by mouth daily.   Yes Historical Provider, MD  Probiotic Product (PROBIOTIC PO) Take 1 tablet by mouth daily.    Yes Historical Provider, MD  benzonatate (TESSALON) 100 MG capsule Take 1 capsule (100 mg total) by mouth 3 (three) times daily as needed. 07/25/16   Everlene Farrier, PA-C  fluticasone (FLONASE) 50 MCG/ACT nasal spray Place 2 sprays into both nostrils daily. 07/25/16   Everlene Farrier, PA-C  loratadine (CLARITIN) 10 MG tablet Take 1 tablet (10 mg total) by mouth daily. 07/25/16   Everlene Farrier, PA-C  naproxen (NAPROSYN) 500 MG tablet Take 1 tablet (500 mg total) by mouth 2 (two) times daily with a meal. 07/25/16   Everlene Farrier, PA-C    Family History Family History  Problem Relation Age of  Onset  . Cancer Mother   . Hyperlipidemia Mother   . Hyperlipidemia Father     Social History Social History  Substance Use Topics  . Smoking status: Never Smoker  . Smokeless tobacco: Never Used  . Alcohol use No     Allergies   Amoxicillin   Review of Systems Review of Systems  Constitutional: Negative for chills and fever.  HENT: Negative for congestion and sore throat.   Eyes: Negative for visual disturbance.  Respiratory: Positive for cough. Negative for shortness of breath and wheezing.   Cardiovascular: Negative for chest pain and palpitations.    Gastrointestinal: Negative for abdominal pain, diarrhea, nausea and vomiting.  Genitourinary: Negative for difficulty urinating, dysuria, flank pain, frequency, hematuria and urgency.  Musculoskeletal: Positive for arthralgias. Negative for back pain and neck pain.  Skin: Negative for rash.  Neurological: Negative for light-headedness and headaches.     Physical Exam Updated Vital Signs BP 132/71   Pulse 91   Temp 97.5 F (36.4 C) (Oral)   Resp 15   Ht 5\' 7"  (1.702 m)   Wt 104.3 kg   LMP  (LMP Unknown)   SpO2 95%   BMI 36.02 kg/m   Physical Exam  Constitutional: She appears well-developed and well-nourished. No distress.  Nontoxic-appearing.  HENT:  Head: Normocephalic and atraumatic.  Right Ear: External ear normal.  Left Ear: External ear normal.  Mouth/Throat: Oropharynx is clear and moist.  Mild clear middle ear effusion bilaterally without any erythema or loss of landmarks. Boggy nasal turbinates with rhinorrhea present bilaterally. Postnasal drip present. Throat is clear.  Eyes: Conjunctivae are normal. Pupils are equal, round, and reactive to light. Right eye exhibits no discharge. Left eye exhibits no discharge.  Neck: Neck supple. No JVD present. No tracheal deviation present.  Cardiovascular: Normal rate, regular rhythm, normal heart sounds and intact distal pulses.  Exam reveals no gallop and no friction rub.   No murmur heard. Pulmonary/Chest: Effort normal and breath sounds normal. No stridor. No respiratory distress. She has no wheezes. She has no rales. She exhibits tenderness.  Lungs are clear to auscultation bilaterally. No increased work of breathing. No rales or rhonchi. Left lateral lower chest wall is tender to palpation and reproduces her chest pain. No crepitus. No deformity. Symmetric chest expansion bilaterally.  Abdominal: Soft. There is no tenderness. There is no guarding.  No CVA or flank tenderness.  Musculoskeletal: Normal range of motion. She  exhibits no edema or tenderness.  No lower extremity edema or tenderness.  Lymphadenopathy:    She has no cervical adenopathy.  Neurological: She is alert. Coordination normal.  Skin: Skin is warm and dry. Capillary refill takes less than 2 seconds. No rash noted. She is not diaphoretic. No erythema. No pallor.  Psychiatric: She has a normal mood and affect. Her behavior is normal.  Nursing note and vitals reviewed.    ED Treatments / Results  Labs (all labs ordered are listed, but only abnormal results are displayed) Labs Reviewed  I-STAT BETA HCG BLOOD, ED ([facility], [facility], [facility] ONLY)    EKG  EKG Interpretation None       Radiology Dg Chest 2 View  Result Date: 07/25/2016 CLINICAL DATA:  Cough, left flank pain EXAM: CHEST  2 VIEW COMPARISON:  CT chest of 07/17/2016 and chest x-ray of the same day FINDINGS: No definite pneumonia is seen. However the previous parenchymal infiltrate within the right upper lobe was not visualized on chest x-ray, but was seen on  CT of the chest. No pleural effusion is evident. Mediastinal and hilar contours are unremarkable. The heart is within normal limits in size. No bony abnormality is seen. IMPRESSION: No active cardiopulmonary disease. Electronically Signed   By: Dwyane DeePaul  Barry M.D.   On: 07/25/2016 09:24    Procedures Procedures (including critical care time)  Medications Ordered in ED Medications  ibuprofen (ADVIL,MOTRIN) tablet 800 mg (800 mg Oral Given 07/25/16 0942)  benzonatate (TESSALON) capsule 200 mg (200 mg Oral Given 07/25/16 [id number])     Initial Impression / Assessment and Plan / ED Course  I have reviewed the triage vital signs and the nursing notes.  Pertinent labs & imaging results that were available during my care of the patient were reviewed by me and considered in my medical decision making (see chart for details).    This is a 31 y.o. Female who presents to the ED complaining of left lower chest wall pain after coughing today. The  patient was seen in the emergency department 8 days ago and was diagnosed with pneumonia. At this time she had a CT angiogram of her chest was negative for PE. Patient reports she's had lots of coughing since and has been having some slight pain in her left lateral lower chest wall. She reports today after also coughing she felt a pop and worsening pain. She reports the pain is worse with coughing and palpation of the area. She denies any chest pain or shortness of breath. She tells me generally she is feeling much better and has improved greatly. She has 1 more day of doxycycline after today. She tells me she is still having rhinorrhea and postnasal drip. She denies fevers, chest pain or shortness of breath. On exam the patient is afebrile nontoxic-appearing. She has no tachypnea or hypoxia. Her lungs are clear to auscultation bilaterally. Symmetric chest expansion bilaterally. She has a left lateral lower chest wall tenderness to palpation which reproduces her pain. No crepitus. No overlying skin changes. She denies urinary symptoms. Chest x-ray is unremarkable. Patient is completing another day of doxycycline for her pneumonia seen on CT angiographic or chest. No PE was found. I see no need for further workup as this time as the patient is afebrile and she has reproducible pain from coughing. Patient's persistent cough is likely related to her postnasal drip. Will start her on Flonase and Claritin and encouraged her to push oral fluids. Tessalon Perles for coughing and naproxen for pain control. I discussed strict and specific return precautions. I advised the patient to follow-up with their primary care provider this week. I advised the patient to return to the emergency department with new or worsening symptoms or new concerns. The patient verbalized understanding and agreement with plan.     Final Clinical Impressions(s) / ED Diagnoses   Final diagnoses:  Cough  Acute allergic rhinitis, unspecified  seasonality, unspecified trigger    New Prescriptions New Prescriptions   BENZONATATE (TESSALON) 100 MG CAPSULE    Take 1 capsule (100 mg total) by mouth 3 (three) times daily as needed.   FLUTICASONE (FLONASE) 50 MCG/ACT NASAL SPRAY    Place 2 sprays into both nostrils daily.   LORATADINE (CLARITIN) 10 MG TABLET    Take 1 tablet (10 mg total) by mouth daily.   NAPROXEN (NAPROSYN) 500 MG TABLET    Take 1 tablet (500 mg total) by mouth 2 (two) times daily with a meal.     Everlene FarrierWilliam Alantra Popoca, PA-C 07/25/16 1047  Nira Conn, MD 07/25/16 [id number]

## 2016-07-25 NOTE — ED Triage Notes (Addendum)
Pt arrives EMS with c/o Left flank pain that occurred while coughing this morning. Pt states she heard pop sound while coughing. Pt has hx of pneumonia and guilllian barre syndrome. Coughing x 10 days. Given fentanyl iv in route.

## 2017-11-26 ENCOUNTER — Other Ambulatory Visit: Payer: Self-pay

## 2017-11-26 ENCOUNTER — Emergency Department (HOSPITAL_COMMUNITY)
Admission: EM | Admit: 2017-11-26 | Discharge: 2017-11-27 | Disposition: A | Discharge status: Home / Self Care | Payer: BC Managed Care – PPO | Attending: Emergency Medicine | Admitting: Emergency Medicine

## 2017-11-26 ENCOUNTER — Encounter (HOSPITAL_COMMUNITY): Payer: Self-pay

## 2017-11-26 DIAGNOSIS — Z79899 Other long term (current) drug therapy: Secondary | ICD-10-CM | POA: Insufficient documentation

## 2017-11-26 DIAGNOSIS — R42 Dizziness and giddiness: Secondary | ICD-10-CM | POA: Diagnosis not present

## 2017-11-26 HISTORY — DX: Dizziness and giddiness: R42

## 2017-11-26 LAB — CBC WITH DIFFERENTIAL/PLATELET
Abs Immature Granulocytes: 0 10*3/uL (ref 0.0–0.1)
BASOS PCT: 0 %
Basophils Absolute: 0 10*3/uL (ref 0.0–0.1)
EOS PCT: 2 %
Eosinophils Absolute: 0.3 10*3/uL (ref 0.0–0.7)
HCT: 41.3 % (ref 36.0–46.0)
Hemoglobin: 13.2 g/dL (ref 12.0–15.0)
Immature Granulocytes: 0 %
Lymphocytes Relative: 31 %
Lymphs Abs: 3.4 10*3/uL (ref 0.7–4.0)
MCH: 28.8 pg (ref 26.0–34.0)
MCHC: 32 g/dL (ref 30.0–36.0)
MCV: 90.2 fL (ref 78.0–100.0)
Monocytes Absolute: 0.6 10*3/uL (ref 0.1–1.0)
Monocytes Relative: 5 %
Neutro Abs: 6.6 10*3/uL (ref 1.7–7.7)
Neutrophils Relative %: 60 %
Platelets: 340 10*3/uL (ref 150–400)
RBC: 4.58 MIL/uL (ref 3.87–5.11)
RDW: 12.9 % (ref 11.5–15.5)
WBC: 11 10*3/uL — AB (ref 4.0–10.5)

## 2017-11-26 LAB — I-STAT BETA HCG BLOOD, ED (MC, WL, AP ONLY)

## 2017-11-26 LAB — URINALYSIS, ROUTINE W REFLEX MICROSCOPIC
Bilirubin Urine: NEGATIVE
Glucose, UA: NEGATIVE mg/dL
HGB URINE DIPSTICK: NEGATIVE
KETONES UR: NEGATIVE mg/dL
Leukocytes, UA: NEGATIVE
NITRITE: NEGATIVE
Protein, ur: NEGATIVE mg/dL
Specific Gravity, Urine: 1.024 (ref 1.005–1.030)
pH: 5 (ref 5.0–8.0)

## 2017-11-26 LAB — COMPREHENSIVE METABOLIC PANEL
ALT: 18 U/L (ref 0–44)
AST: 22 U/L (ref 15–41)
Albumin: 3.7 g/dL (ref 3.5–5.0)
Alkaline Phosphatase: 49 U/L (ref 38–126)
Anion gap: 11 (ref 5–15)
BILIRUBIN TOTAL: 0.3 mg/dL (ref 0.3–1.2)
BUN: 10 mg/dL (ref 6–20)
CO2: 23 mmol/L (ref 22–32)
CREATININE: 0.7 mg/dL (ref 0.44–1.00)
Calcium: 9.3 mg/dL (ref 8.9–10.3)
Chloride: 107 mmol/L (ref 98–111)
GFR calc Af Amer: >60 mL/min (ref >60)
Glucose, Bld: 129 mg/dL — ABNORMAL HIGH (ref 70–99)
Potassium: 3.6 mmol/L (ref 3.5–5.1)
Sodium: 141 mmol/L (ref 135–145)
TOTAL PROTEIN: 7.7 g/dL (ref 6.5–8.1)

## 2017-11-26 LAB — LIPASE, BLOOD: LIPASE: 36 U/L (ref 11–51)

## 2017-11-26 MED ORDER — LORAZEPAM 1 MG PO TABS: 1.0000 mg | ORAL_TABLET | Freq: Three times a day (TID) | ORAL | 0 refills | Status: DC | PRN

## 2017-11-26 MED ORDER — LORAZEPAM 2 MG/ML IJ SOLN
1.0000 mg | Freq: Once | INTRAMUSCULAR | Status: AC
  Administered 2017-11-26: 1 mg via INTRAVENOUS
  Filled 2017-11-26: qty 1

## 2017-11-26 MED ORDER — PREDNISONE 10 MG (21) PO TBPK: ORAL_TABLET | Freq: Every day | ORAL | 0 refills | Status: DC

## 2017-11-26 MED ORDER — DIAZEPAM 2 MG PO TABS
2.0000 mg | ORAL_TABLET | Freq: Once | ORAL | Status: AC
  Administered 2017-11-26: 2 mg via ORAL
  Filled 2017-11-26: qty 1

## 2017-11-26 MED ORDER — METHYLPREDNISOLONE SODIUM SUCC 125 MG IJ SOLR
125.0000 mg | Freq: Once | INTRAMUSCULAR | Status: AC
  Administered 2017-11-26: 125 mg via INTRAVENOUS
  Filled 2017-11-26: qty 2

## 2017-11-26 MED ORDER — MECLIZINE HCL 25 MG PO TABS
50.0000 mg | ORAL_TABLET | Freq: Once | ORAL | Status: AC
  Administered 2017-11-26: 50 mg via ORAL
  Filled 2017-11-26: qty 2

## 2017-11-26 MED ORDER — SODIUM CHLORIDE 0.9 % IV BOLUS
1000.0000 mL | Freq: Once | INTRAVENOUS | Status: AC
  Administered 2017-11-26: 1000 mL via INTRAVENOUS

## 2017-11-26 NOTE — ED Notes (Addendum)
Pt reports feeling dizzy for the last week with same getting worse. Pt reports a hx of vertigo that usually lasts only 3 hours and then gets better. Pt reports having meclizine prescription and took same with no relief. Pt report no NEW pain, chronic syatica pain.

## 2017-11-26 NOTE — ED Provider Notes (Signed)
[facility] EMERGENCY DEPARTMENT Provider Note   CSN: [id number] Arrival date & time: 11/26/17  1659     History   Chief Complaint Chief Complaint  Patient presents with  . Dizziness    HPI [name] is a 32 y.o. female.   Dizziness  Quality:  Lightheadedness and vertigo Severity:  Mild Onset quality:  Gradual Duration:  6 days Timing:  Constant Progression:  Worsening Chronicity:  Recurrent Context: head movement, physical activity and standing up   Relieved by:  Being still Worsened by:  Movement Ineffective treatments:  Medication Associated symptoms: no blood in stool, no chest pain, no palpitations and no shortness of breath     Past Medical History:  Diagnosis Date  . Guillain-Barre (HCC)   . Vertigo     There are no active problems to display for this patient.   Past Surgical History:  Procedure Laterality Date  . ANKLE SURGERY Left      OB History   None      Home Medications    Prior to Admission medications   Medication Sig Start Date End Date Taking? Authorizing Provider  acetaminophen (TYLENOL) 500 MG tablet Take 1,000 mg by mouth every 6 (six) hours as needed for mild pain, moderate pain, fever or headache.   Yes [provider]  aspirin-acetaminophen-caffeine (EXCEDRIN MIGRAINE) [phone] MG tablet Take 2 tablets by mouth every 6 (six) hours as needed for headache or migraine.   Yes [provider]  atorvastatin (LIPITOR) 20 MG tablet Take 20 mg by mouth daily.   Yes [provider]  buPROPion (WELLBUTRIN XL) 300 MG 24 hr tablet Take 300 mg by mouth daily. 11/06/17  Yes [provider]  cloNIDine HCl (KAPVAY) 0.1 MG TB12 ER tablet Take 0.1 mg by mouth at bedtime. 11/11/17  Yes [provider]  drospirenone-ethinyl estradiol (YAZ,GIANVI,LORYNA) 3-0.02 MG tablet Take 1 tablet by mouth daily.   Yes [provider]  fenofibrate 54 MG tablet Take 54 mg by mouth daily.    Yes [provider]  fluticasone (FLONASE) 50 MCG/ACT nasal spray Place 2 sprays into both nostrils daily. Patient taking differently: Place 2 sprays into both nostrils daily as needed for allergies.  07/25/16  Yes Everlene Farrier, PA-C  glycopyrrolate (ROBINUL) 2 MG tablet Take 1 mg by mouth daily as needed (sweating).    Yes [provider]  loratadine (CLARITIN) 10 MG tablet Take 1 tablet (10 mg total) by mouth daily. 07/25/16  Yes Everlene Farrier, PA-C  meclizine (ANTIVERT) 25 MG tablet Take 25 mg by mouth 3 (three) times daily as needed for dizziness.   Yes [provider]  Multiple Vitamin (MULTIVITAMIN WITH MINERALS) TABS tablet Take 1 tablet by mouth daily.   Yes [provider]  naproxen sodium (ALEVE) 220 MG tablet Take 220 mg by mouth daily as needed (pain).   Yes [provider]  phentermine (ADIPEX-P) 37.5 MG tablet Take 37.5 mg by mouth daily. 11/18/17  Yes [provider]  Probiotic Product (PROBIOTIC PO) Take 1 tablet by mouth daily.    Yes [provider]  propranolol (INDERAL) 10 MG tablet Take 10 mg by mouth daily as needed. 11/11/17  Yes [provider]  benzonatate (TESSALON) 100 MG capsule Take 1 capsule (100 mg total) by mouth 3 (three) times daily as needed. Patient not taking: Reported on 11/26/2017 07/25/16   Everlene Farrier, PA-C  LORazepam (ATIVAN) 1 MG tablet Take 1 tablet (1 mg total)  by mouth 3 (three) times daily as needed (vertigo). 11/26/17   Charleen Madera, Barbara Cower, MD  naproxen (NAPROSYN) 500 MG tablet Take 1 tablet (500 mg total) by mouth 2 (two) times daily with a meal. Patient not taking: Reported on 11/26/2017 07/25/16   Everlene Farrier, PA-C  predniSONE (STERAPRED UNI-PAK 21 TAB) 10 MG (21) TBPK tablet Take by mouth daily. Take 6 tabs by mouth daily  for 2 days, then 5 tabs for 2 days, then 4 tabs for 2 days, then 3 tabs for 2 days, 2 tabs for 2 days, then 1 tab by mouth daily for 2 days 11/26/17   Kerah Hardebeck,  Barbara Cower, MD    Family History Family History  Problem Relation Age of Onset  . Cancer Mother   . Hyperlipidemia Mother   . Hyperlipidemia Father     Social History Social History   Tobacco Use  . Smoking status: Never Smoker  . Smokeless tobacco: Never Used  Substance Use Topics  . Alcohol use: Yes    Comment: rare  . Drug use: No     Allergies   Amoxicillin   Review of Systems Review of Systems  Respiratory: Negative for shortness of breath.   Cardiovascular: Negative for chest pain and palpitations.  Gastrointestinal: Negative for blood in stool.  Neurological: Positive for dizziness.  All other systems reviewed and are negative.    Physical Exam Updated Vital Signs BP 140/72 (BP Location: Right Arm)   Pulse 91   Temp 98.2 F (36.8 C) (Oral)   Resp (!) 23   Ht 5\' 6"  (1.676 m)   Wt 103.9 kg (229 lb)   SpO2 99%   BMI 36.96 kg/m   Physical Exam  Constitutional: She is oriented to person, place, and time. She appears well-developed and well-nourished.  HENT:  Head: Normocephalic and atraumatic.  Right Ear: Tympanic membrane normal. No middle ear effusion.  Left Ear: A middle ear effusion is present.  Eyes: Conjunctivae and EOM are normal.  Neck: Normal range of motion.  Cardiovascular: Normal rate and regular rhythm.  Pulmonary/Chest: No stridor. No respiratory distress.  Abdominal: Soft. Bowel sounds are normal. She exhibits no distension.  Neurological: She is alert and oriented to person, place, and time. No cranial nerve deficit. Coordination normal.  No altered mental status, able to give full seemingly accurate history.  Face is symmetric, EOM's intact, pupils equal and reactive, vision intact, tongue and uvula midline without deviation. Upper and Lower extremity motor 5/5, intact pain perception in distal extremities, 2+ reflexes in biceps, patella and achilles tendons. Able to perform finger to nose normal with both hands. Walks without assistance  or evident ataxia.   Skin: Skin is warm and dry.  Nursing note and vitals reviewed.    ED Treatments / Results  Labs (all labs ordered are listed, but only abnormal results are displayed) Labs Reviewed  CBC WITH DIFFERENTIAL/PLATELET - Abnormal; Notable for the following components:      Result Value   WBC 11.0 (*)    All other components within normal limits  COMPREHENSIVE METABOLIC PANEL - Abnormal; Notable for the following components:   Glucose, Bld 129 (*)    All other components within normal limits  URINALYSIS, ROUTINE W REFLEX MICROSCOPIC  LIPASE, BLOOD  I-STAT BETA HCG BLOOD, ED ([facility], [facility], [facility] ONLY)    EKG None  Radiology No results found.  Procedures Procedures (including critical care time)  Medications Ordered in ED Medications  diazepam (VALIUM) tablet 2 mg (2 mg  Oral Given 11/26/17 1722)  meclizine (ANTIVERT) tablet 50 mg (50 mg Oral Given 11/26/17 1722)  sodium chloride 0.9 % bolus 1,000 mL (0 mLs Intravenous Stopped 11/26/17 2325)  LORazepam (ATIVAN) injection 1 mg (1 mg Intravenous Given 11/26/17 2203)  methylPREDNISolone sodium succinate (SOLU-MEDROL) 125 mg/2 mL injection 125 mg (125 mg Intravenous Given 11/26/17 2200)     Initial Impression / Assessment and Plan / ED Course  I have reviewed the triage vital signs and the nursing notes.  Pertinent labs & imaging results that were available during my care of the patient were reviewed by me and considered in my medical decision making (see chart for details).     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  Will start steroids.  Benzodiazepines seem to help with her dizziness more than the meclizine does so we will give her prescription for that as well.  We will have her follow-up with ENT for further management.  Final Clinical Impressions(s) / ED Diagnoses   Final diagnoses:  Vertigo    ED Discharge Orders        Ordered     predniSONE (STERAPRED UNI-PAK 21 TAB) 10 MG (21) TBPK tablet  Daily     11/26/17 2326    LORazepam (ATIVAN) 1 MG tablet  3 times daily PRN     11/26/17 2326       Rowan Pollman, Barbara CowerJason, MD 11/27/17 1624

## 2017-11-26 NOTE — ED Provider Notes (Signed)
Patient placed in Quick Look pathway, seen and evaluated   Chief Complaint: vertigo  HPI:   Hx of recurrent vertigo. Onset of vertigo x 1wwke, worse with movement and position change. Took meclizine without imptovement today.   ROS: vertigo (one)  Physical Exam:   Gen: No distress  Neuro: Awake and Alert  Skin: Warm    Focused Exam:    Initiation of care has begun. The patient has been counseled on the process, plan, and necessity for staying for the completion/evaluation, and the remainder of the medical screening examination    Arthor CaptainHarris, Jabriel Vanduyne, Cordelia Poche-C 11/26/17 1720    Mesner, Barbara CowerJason, MD 11/27/17 [phone]

## 2017-11-26 NOTE — ED Triage Notes (Addendum)
Pt endorses dizziness x 1 week, worse over last 4 days to the point that pt has had difficulty carrying out daily activities and worse with head turn and position changes. Hx of vertigo that is getting progessively worse. States "this feels different though" Hypertensive and tachy. No neuro deficits. Takes meclizine and it has not been effective over last few days.

## 2017-12-08 ENCOUNTER — Other Ambulatory Visit: Payer: Self-pay

## 2017-12-08 ENCOUNTER — Encounter: Payer: Self-pay | Admitting: Physical Therapy

## 2017-12-08 ENCOUNTER — Ambulatory Visit: Payer: BC Managed Care – PPO | Attending: Family Medicine | Admitting: Physical Therapy

## 2017-12-08 DIAGNOSIS — R42 Dizziness and giddiness: Secondary | ICD-10-CM | POA: Diagnosis present

## 2017-12-08 DIAGNOSIS — R262 Difficulty in walking, not elsewhere classified: Secondary | ICD-10-CM | POA: Insufficient documentation

## 2017-12-08 DIAGNOSIS — R2681 Unsteadiness on feet: Secondary | ICD-10-CM | POA: Diagnosis present

## 2017-12-08 NOTE — Therapy (Signed)
[facility] [address] [place], [place], [address] Phone: [phone]   Fax:  [phone]  Physical Therapy Evaluation  Patient Details  Name: [name] MRN: [id number] Date of Birth: 09-14-1985 Referring Provider: Sigmund Hazel, MD   Encounter Date: 12/08/2017  PT End of Session - 12/08/17 2137    Visit Number  1    Number of Visits  11    Date for PT Re-Evaluation  01/19/18    Authorization Type  [employer]; VL:[place]    PT Start Time  1018    PT Stop Time  1102    PT Time Calculation (min)  44 min    Activity Tolerance  Patient tolerated treatment well    Behavior During Therapy  [facility] for tasks assessed/performed       Past Medical History:  Diagnosis Date  . Guillain-Barre (HCC)   . Vertigo     Past Surgical History:  Procedure Laterality Date  . ANKLE SURGERY Left     There were no vitals filed for this visit.   Subjective Assessment - 12/08/17 1022    Subjective  Onset began in April with ear infection and mild dizziness. Worsened and went to ED 11/26/17. Currently about 70% of her normal. Still has discomfort/pain in left ear. Just finished prednisone today. Taking 50 mg meclizine every day (including today). I'm not throwing up anymore. I don't have the stamina I had before.     Pertinent History  Guillain-Barre     Patient Stated Goals  to stop feeling dizzy and nauseated prior to return to work in [employer])    Currently in Pain?  No/denies         [facility] PT Assessment - 12/08/17 1028      Assessment   Medical Diagnosis  dizziness    Referring Provider  Sigmund Hazel, MD    Onset Date/Surgical Date  -- April 2019 (when 1st ear infection happened)    Prior Therapy  none      Precautions   Precautions  Fall      Balance Screen   Has the patient fallen in the past 6 months  No    Has the patient had a decrease in activity level because of a fear of falling?   No    Is  the patient reluctant to leave their home because of a fear of falling?   No      Home Environment   Living Environment  Private residence    Living Arrangements  Alone    Available Help at Discharge  Family;Available PRN/intermittently parents     Type of Home  House      Prior Function   Level of Independence  Independent    Vocation  Full time employment    Press photographer      Cognition   Overall Cognitive Status  Within Functional Limits for tasks assessed      Observation/Other Assessments   Observations  well-kept appearance (hair and makeup done; frequent smiles)    Focus on Therapeutic Outcomes (FOTO)   -- not set-up      Ambulation/Gait   Ambulation/Gait  Yes    Ambulation/Gait Assistance  6: Modified independent (Device/Increase time);5: Supervision    Ambulation/Gait Assistance Details  modified independent (slow) on arrival; to exit supervision for safety due to incr dizziness    Ambulation Distance (Feet)  50 Feet x4    Assistive device  None  Gait Pattern  Decreased arm swing - right;Decreased arm swing - left;Decreased stride length;Right foot flat;Left foot flat;Decreased trunk rotation;Wide base of support    Ambulation Surface  Indoor           Vestibular Assessment - 12/08/17 1045      Vestibular Assessment   General Observation  walks with wide BOS, arms guarded, without head turns      Symptom Behavior   Type of Dizziness  "Funny feeling in head"    Frequency of Dizziness  daily    Duration of Dizziness  seconds    Aggravating Factors  Turning head sideways;Sitting in moving car    Relieving Factors  Closing eyes;Head stationary;Slow movements      Occulomotor Exam   Occulomotor Alignment  Normal    Spontaneous  Absent    Gaze-induced  Absent    Smooth Pursuits  Intact      Visual Acuity   Static  9    Dynamic  5          Objective measurements completed on examination: See above findings.       Vestibular  Treatment/Exercise - 12/08/17 0001      Vestibular Treatment/Exercise   Vestibular Treatment Provided  Gaze;Habituation    Habituation Exercises  Seated Horizontal Head Turns    Gaze Exercises  X1 Viewing Horizontal      Seated Horizontal Head Turns   Number of Reps   2    Symptom Description   unsteady, vision blurs      X1 Viewing Horizontal   Foot Position  apart    Reps  2    Comments  20-30 seconds; symptoms up to 5/10; subsided quickly            PT Education - 12/08/17 2134    Education Details Recommend return to PCP due to just completed prednisone and still feels pain, pressure in left ear. ?ability to benefit from vestibular rehab if still has a left ear infection; likely neuritis has progressed to hypofunction (pt with 4 line difference static vs dynamic visual acuity). Educated in VORx1 exercise. Educated on fixing gaze if symptoms increasing (walking in crowded areas, riding in car). Need to increase turning her head/neck to habituate/re-train vestibular system   Person(s) Educated  Patient    Methods  Explanation;Demonstration;Verbal cues;Handout    Comprehension  Verbalized understanding;Returned demonstration;Verbal cues required;Need further instruction       PT Short Term Goals - 12/08/17 2156      PT SHORT TERM GOAL #1   Title  Patient will be independent with HEP to address vestibular and balance deficits (Target for all STGs 12/29/2017)    Time  3    Period  Weeks    Status  New    Target Date  12/29/17      PT SHORT TERM GOAL #2   Title  Patient will complete FGA for objective measure of balance and gait (i.e fall risk). Set LTG if appropriate.     Time  3    Period  Weeks    Status  New      PT SHORT TERM GOAL #3   Title  Patient will demonstrate improved function of peripheral vestibular system with symptoms <5/10 with 60 seconds of VORx1 exercise.     Time  3    Period  Weeks    Status  New        PT Long Term Goals - 12/08/17 2200  PT LONG TERM GOAL #1   Title  Patient will ambulate in busy environment with head turns, gait velocity >2.62 ft/sec (minimal safe community ambulation speed), and symptoms <=3/10. (Target for all LTGs 01/19/2018)    Time  6    Period  Weeks    Status  New    Target Date  01/19/18      PT LONG TERM GOAL #2   Title  Patient will report balance and mobility being >=90% back to her normal.     Time  6    Period  Weeks    Status  New      PT LONG TERM GOAL #3   Title  Patient independent with updated HEP for vestibular and balance deficits.     Time  6    Period  Weeks    Status  New             Plan - 12/08/17 2140    Clinical Impression Statement  32 year old referred to OPPT due to several month history of worsening balance and dizzness. Diagnosed and treated for left middle ear infection, however symptoms have not fully resolved and she continues to have pain/pressure in her left ear. Patient educated to call her PCP to update them on her continued symptoms. Description of symptoms and vestibular assessment also consistent with vestibular hypofunction. Patient able to tolerate VORx1 exercises for ~30 seconds and therefore initiated HEP for hypofunction. Patient can benefit from the PT interventions listed below to address the deficits listed below.     History and Personal Factors relevant to plan of care:  PMH-Guillain-Barre     Clinical Presentation  Evolving    Clinical Presentation due to:  continued symptoms of left ear infection despite steroids    Clinical Decision Making  Low    Rehab Potential  Good    Clinical Impairments Affecting Rehab Potential  Possible unresolved left ear infection could slow progress    PT Frequency  -- 1x/week x 2weeks; 2x/week x 4 weeks    PT Duration  6 weeks    PT Treatment/Interventions  ADLs/Self Care Home Management;Canalith Repostioning;Gait training;DME Instruction;Functional mobility training;Therapeutic activities;Balance  training;Neuromuscular re-education;Patient/family education;Passive range of motion;Visual/perceptual remediation/compensation;Vestibular    PT Next Visit Plan  check if saw/spoke with PCP and if any meds prescribed for ear infection; make sure she is not using meclizine; check technique/tolerance for VORx1 horizontal and vertical; progress VOR as approp; ?initiate gait with head turns    Recommended Other Services  return to PCP re: ?continued ear infection    Consulted and Agree with Plan of Care  Patient       Patient will benefit from skilled therapeutic intervention in order to improve the following deficits and impairments:  Abnormal gait, Decreased activity tolerance, Decreased balance, Decreased mobility, Dizziness, Obesity  Visit Diagnosis: Unsteadiness on feet - Plan: PT plan of care cert/re-cert  Dizziness and giddiness - Plan: PT plan of care cert/re-cert  Difficulty in walking, not elsewhere classified - Plan: PT plan of care cert/re-cert     Problem List There are no active problems to display for this patient.   Zena Amos, PT 12/08/2017, 10:09 PM  [facility] [address] [place], [place], [address] Phone: [phone]   Fax:  [phone]  Name: [name] MRN: [id number] Date of Birth: [birth year]

## 2017-12-08 NOTE — Patient Instructions (Signed)
Gaze Stabilization: Tip Card  1.Target must remain in focus, not blurry, and appear stationary while head is in motion. 2.Perform exercises with small head movements (45 to either side of midline). 3.Increase speed of head motion so long as target is in focus. 4.If you wear eyeglasses, be sure you can see target through lens (therapist will give specific instructions for bifocal / progressive lenses). 5.These exercises may provoke dizziness or nausea. Work through these symptoms. If too dizzy, slow head movement slightly. Rest between each exercise. 6.Exercises demand concentration; avoid distractions.  Copyright  VHI. All rights reserved.     Special Instructions: Exercises may bring on mild to moderate symptoms of dizziness/nausea that resolve within 30 minutes of completing exercises. If symptoms are lasting longer than 30 minutes, modify your exercises by:  >decreasing the # of times you complete each activity >ensuring your symptoms return to baseline before moving onto the next exercise >dividing up exercises so you do not do them all in one session, but multiple short sessions throughout the day >doing them once a day until symptoms improve    Keeping eyes on target on wall 3 feet away, and move head side to side for _30-60___ seconds. Repeat while moving head up and down for __30-60__ seconds. Do __2-3__ sessions per day.    For safety, perform standing exercises close to a counter, wall, corner, or next to someone.   Gaze Stabilization - Standing Feet Apart   Feet shoulder width apart, keeping eyes on target on wall 3 feet away, tilt head down slightly and move head side to side for 30-60 seconds. Stop and rest. Repeat while moving head up and down for 30-60 seconds. *Work up to tolerating 60 seconds, as able. Repeat each direction 3 times.  Do 3 sessions per day.   Copyright  VHI. All rights reserved.

## 2017-12-18 ENCOUNTER — Ambulatory Visit: Payer: BC Managed Care – PPO | Admitting: Physical Therapy

## 2017-12-18 ENCOUNTER — Encounter: Payer: Self-pay | Admitting: Physical Therapy

## 2017-12-18 DIAGNOSIS — R2681 Unsteadiness on feet: Secondary | ICD-10-CM

## 2017-12-18 DIAGNOSIS — R42 Dizziness and giddiness: Secondary | ICD-10-CM

## 2017-12-18 NOTE — Therapy (Signed)
[facility] [address] [place], [place], [address] Phone: [phone]   Fax:  [phone]  Physical Therapy Treatment  Patient Details  Name: [name] MRN: [id number] Date of Birth: [birth year] Referring Provider: Sigmund HazelMiller, Lisa, MD   Encounter Date: 12/18/2017  PT End of Session - 12/18/17 2037    Visit Number  2    Number of Visits  11    Date for PT Re-Evaluation  01/19/18    Authorization Type  [employer]; VL:MN    PT Start Time  1314    PT Stop Time  1400    PT Time Calculation (min)  46 min    Activity Tolerance  Patient tolerated treatment well    Behavior During Therapy  [facility] for tasks assessed/performed       Past Medical History:  Diagnosis Date  . Guillain-Barre (HCC)   . Vertigo     Past Surgical History:  Procedure Laterality Date  . ANKLE SURGERY Left     There were no vitals filed for this visit.  Subjective Assessment - 12/18/17 1311    Subjective  Saw her PCP after last visit and did NOT have fluid in her ear so he did not prescribe any further meds. I stopped the meclizine and I have not had the dizziness for 4 days. Still have fullness and headache on Left side. Has ENT appt July 30. PCP is worried she might have Meniere's. Has a buzzing sound in left ear. Feels sound is muffled. Has changed to a low-sodium diet and did better until had sushi and thinks soy sauce triggered event.     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  Yes    Pain Score  3     Pain Location  Head    Pain Descriptors / Indicators  Aching;Headache    Pain Type  Chronic pain    Pain Onset  More than a month ago    Pain Frequency  Intermittent                       [facility] Adult PT Treatment/Exercise - 12/18/17 2033      Ambulation/Gait   Ambulation/Gait  Assistance  5: Supervision    Ambulation/Gait Assistance Details  adding head turns all directions, scanning tasks, tossing ball up into field of vision, turns    Ambulation Distance (Feet)  400 Feet    Assistive device  None    Gait Pattern  Step-through pattern mild drift/stagger with some head turns    Ambulation Surface  Level;Indoor    Gait velocity  slows when given dual task      Vestibular Treatment/Exercise - 12/18/17 2030      Vestibular Treatment/Exercise   Vestibular Treatment Provided  Habituation;Gaze    Habituation Exercises  Standing Horizontal Head Turns;180 degree Turns seated forward bending; standing forward bend to chair heigh    Gaze Exercises  X1 Viewing Horizontal;X1 Viewing Vertical      180 degree Turns   Number of Reps   5 each direction    Symptom Description   denied incr symptoms      X1 Viewing Horizontal   Foot Position  feet apart, feet together    Time  -- 28-45 sec    Reps  3  Comments  feet apart x 1; feet closer together x 2      X1 Viewing Vertical   Foot Position  feet apart, feet together    Time  -- 30 sec    Reps  2            PT Education - 12/18/17 1403    Education Details  updated HEP with rationale; discussed symptoms that do point towards Meniere's    Person(s) Educated  Patient    Methods  Explanation;Demonstration;Verbal cues;Handout    Comprehension  Verbalized understanding;Returned demonstration;Verbal cues required;Need further instruction       PT Short Term Goals - 12/08/17 2156      PT SHORT TERM GOAL #1   Title  Patient will be independent with HEP to address vestibular and balance deficits (Target for all STGs 12/29/2017)    Time  3    Period  Weeks    Status  New    Target Date  12/29/17      PT SHORT TERM GOAL #2   Title  Patient will complete FGA for objective measure of balance and gait (i.e fall risk). Set LTG if appropriate.     Time  3    Period  Weeks    Status  New      PT SHORT TERM GOAL  #3   Title  Patient will demonstrate improved function of peripheral vestibular system with symptoms <5/10 with 60 seconds of VORx1 exercise.     Time  3    Period  Weeks    Status  New        PT Long Term Goals - 12/08/17 2200      PT LONG TERM GOAL #1   Title  Patient will ambulate in busy environment with head turns, gait velocity >2.62 ft/sec (minimal safe community ambulation speed), and symptoms <=3/10. (Target for all LTGs 01/19/2018)    Time  6    Period  Weeks    Status  New    Target Date  01/19/18      PT LONG TERM GOAL #2   Title  Patient will report balance and mobility being >=90% back to her normal.     Time  6    Period  Weeks    Status  New      PT LONG TERM GOAL #3   Title  Patient independent with updated HEP for vestibular and balance deficits.     Time  6    Period  Weeks    Status  New            Plan - 12/18/17 2038    Clinical Impression Statement  Patient has been faithfully doing her exercises and is doing much better. Updated her HEP and discussed she may want to cancel next appt and wait to come back after she has seen the ENT. She will wait and see how she feels as she gets closer to next appt.     Rehab Potential  Good    Clinical Impairments Affecting Rehab Potential  Possible unresolved left ear infection could slow progress    PT Frequency  -- 1x/week x 2weeks; 2x/week x 4 weeks    PT Duration  6 weeks    PT Treatment/Interventions  ADLs/Self Care Home Management;Canalith Repostioning;Gait training;DME Instruction;Functional mobility training;Therapeutic activities;Balance training;Neuromuscular re-education;Patient/family education;Passive range of motion;Visual/perceptual remediation/compensation;Vestibular    PT Next Visit Plan  check technique/tolerance for VORx1 horizontal and vertical; progress VOR as  approp; gait with head turns and other dual task or cognitive challenges; try corner exercises on foam    Consulted and Agree with Plan  of Care  Patient       Patient will benefit from skilled therapeutic intervention in order to improve the following deficits and impairments:  Abnormal gait, Decreased activity tolerance, Decreased balance, Decreased mobility, Dizziness, Obesity  Visit Diagnosis: Unsteadiness on feet  Dizziness and giddiness     Problem List There are no active problems to display for this patient.   Zena Amos, PT 12/18/2017, 8:40 PM  [facility] [address] [place], [place], [address] Phone: [phone]   Fax:  [phone]  Name: [name] MRN: [id number] Date of Birth: 1986/05/26

## 2017-12-18 NOTE — Patient Instructions (Signed)
   When doing "E" exercise, begin bringing your feet closer together (even if you have to decrease time down to 30 seconds).  Add bending over to touch lower seat/stool and coming back up to stand. Check stance is no more than shoulder width apart. Repeat 5 times. Rest. Repeat 5 times. If symptoms resolving in <5 minutes, progress to 10 reps in a row.   When you can tolerate 10 times in a row with mild symptoms, then lower the target closer to the floor.   Keep moving your head around as you walk. Try adding tossing something back and forth between your hands.

## 2017-12-29 ENCOUNTER — Ambulatory Visit: Payer: BC Managed Care – PPO | Admitting: Physical Therapy

## 2018-01-05 ENCOUNTER — Encounter: Payer: Self-pay | Admitting: Physical Therapy

## 2018-01-05 ENCOUNTER — Ambulatory Visit: Payer: BC Managed Care – PPO | Attending: Family Medicine | Admitting: Physical Therapy

## 2018-01-05 DIAGNOSIS — R42 Dizziness and giddiness: Secondary | ICD-10-CM | POA: Diagnosis present

## 2018-01-05 NOTE — Patient Instructions (Addendum)
Access Code: [email or web address]  URL: [email or web address]  Date: 01/05/2018  Prepared by: Veda CanningLynn Jarman Litton   Exercises  Sternocleidomastoid Stretch - 3 reps - 30 seconds hold - 3x daily - 7x weekly  Supine Chin Tuck - 3 reps - 30 hold - 3x daily - 7x weekly  Seated Cervical Retraction - 3 reps - 30 seconds hold - 3x daily - 7x weekly  Also trapezius stretch with lateral flexion and rotate nose toward axilla

## 2018-01-05 NOTE — Therapy (Signed)
[facility] [address] [place], [place], [address] Phone: [phone]   Fax:  [phone]  Physical Therapy Treatment  Patient Details  Name: [name] MRN: [id number] Date of Birth: [birth year] Referring Provider: Sigmund Hazel, MD   Encounter Date: 01/05/2018  PT End of Session - 01/05/18 1630    Visit Number  3    Number of Visits  11    Date for PT Re-Evaluation  01/19/18    Authorization Type  [employer]; VL:MN    PT Start Time  1535    PT Stop Time  1614    PT Time Calculation (min)  39 min    Activity Tolerance  Patient tolerated treatment well    Behavior During Therapy  [facility] for tasks assessed/performed       Past Medical History:  Diagnosis Date  . Guillain-Barre (HCC)   . Vertigo     Past Surgical History:  Procedure Laterality Date  . ANKLE SURGERY Left     There were no vitals filed for this visit.  Subjective Assessment - 01/05/18 1537    Subjective  Saw ENT and had hearing test. Has hearing loss bil in high frequency. They do not think it is Menieres. They ? if it's vestibular migraines. Still doing low sodium diet and drinking plenty of water. Seeing chiiropractor for neck pain and she referred her to another PT that does dry needling (she has already set this up). Endurance is getting better. Fogginess is getting better. Not napping as much. I was able to play a game of air hockey with my nephew and didn't get sick. Bending and stooping is getting better.  Going to [facility] balance clinic next week for full work-up. Will see Dr. Terrace Arabia next week. Got eyes checked and significant change in rt eye and got new glasses. Had terrible headaches with throwing up 3 days last week . Doesn't know what is triggering migraines (maybe salt, sodium)    Pertinent History  Guillain-Barre     Patient Stated Goals  to stop feeling dizzy and nauseated prior to return to work in [employer])     Currently in Pain?  No/denies    Pain Onset  --         Treatment- Pt in supine and palpated upper traps with significant tightness noted. Educated in chin tuck with 30 sec hold x 2 in supine, then x 2 in sitting. Additional trapezius stretch with lateral flexion followed by rotation of nose down toward the floor x 30 sec x 1 each side. Sternocleidomastoid stretch bil x 30 sec x 1. Provided handout (although forgot to add chin tuck until after pt had left--she was planning to access program via e-mail).  Reviewed current HEP and eliminated exercises as no longer symptomatic with these.  Educated re: dry needling and benefits (pt has upcoming appt with another PT for this).                       PT Education - 01/05/18 1627    Education Details  stop doing VORx1 (not doing it enough to be beneficial and may aggravate neck pain); educated in stretches for traps/SCM; hold future PT appts, however if balance clinic in [place] feels related to migraines, then likely cancel remaining appts here.     Person(s) Educated  Patient    Methods  Explanation;Demonstration;Verbal cues;Handout    Comprehension  Verbalized understanding;Returned demonstration;Verbal cues required  PT Short Term Goals - 01/05/18 1637      PT SHORT TERM GOAL #1   Title  Patient will be independent with HEP to address vestibular and balance deficits (Target for all STGs 12/29/2017)    Baseline  01/05/18 Pt is independent with VOR and habituation, however no longer causing symptoms and d/c'd these specific ex's    Time  3    Period  Weeks    Status  Achieved      PT SHORT TERM GOAL #2   Title  Patient will complete FGA for objective measure of balance and gait (i.e fall risk). Set LTG if appropriate.     Baseline  8/5 deferred    Time  3    Period  Weeks    Status  Deferred      PT SHORT TERM GOAL #3   Title  Patient will demonstrate improved function of peripheral vestibular system  with symptoms <5/10 with 60 seconds of VORx1 exercise.     Baseline  8/5 no incr symptoms with VOR or habituation ex's    Time  3    Period  Weeks    Status  Achieved        PT Long Term Goals - 12/08/17 2200      PT LONG TERM GOAL #1   Title  Patient will ambulate in busy environment with head turns, gait velocity >2.62 ft/sec (minimal safe community ambulation speed), and symptoms <=3/10. (Target for all LTGs 01/19/2018)    Time  6    Period  Weeks    Status  New    Target Date  01/19/18      PT LONG TERM GOAL #2   Title  Patient will report balance and mobility being >=90% back to her normal.     Time  6    Period  Weeks    Status  New      PT LONG TERM GOAL #3   Title  Patient independent with updated HEP for vestibular and balance deficits.     Time  6    Period  Weeks    Status  New            Plan - 01/05/18 1649    Clinical Impression Statement  Patient saw local ENT and they felt her symptoms were likely due to migraines, however ringing in her ears is not explained by migraines and [place] [facility] [place]. Patient has made appropriate appts to address migraines (neurologist, chiropractor/dry needling/massage to address neck tightness). STGs assessed with pt meeting 2 of 3 goals with 3rd goal deferred until medical work-up complete. Educated on self stretches she can do for her neck. Educated to defer next PT appt and see the [facility] Clinic specialists. If they feel there is still a role for vestibular rehab, she will still have appts scheduled here. If they agree this is migraine related, we can cancel all remaining PT appts. Patient in agreement with this plan and very thankful for stretching education provided. "Something I can do for myself"    Rehab Potential  Good    Clinical Impairments Affecting Rehab Potential  Possible unresolved left ear infection could slow progress    PT Frequency  -- 1x/week x 2weeks; 2x/week x 4 weeks    PT  Duration  6 weeks    PT Treatment/Interventions  ADLs/Self Care Home Management;Canalith Repostioning;Gait training;DME Instruction;Functional mobility training;Therapeutic activities;Balance training;Neuromuscular re-education;Patient/family education;Passive range of motion;Visual/perceptual remediation/compensation;Vestibular  PT Next Visit Plan  if returns for vestib rehab--do FGA (and set goal); gait with head turns and other dual task or cognitive challenges; try corner exercises on foam    Consulted and Agree with Plan of Care  Patient       Patient will benefit from skilled therapeutic intervention in order to improve the following deficits and impairments:  Abnormal gait, Decreased activity tolerance, Decreased balance, Decreased mobility, Dizziness, Obesity  Visit Diagnosis: Dizziness and giddiness     Problem List There are no active problems to display for this patient.   Zena Amos, PT 01/05/2018, 5:04 PM  [facility] [address] [place], [place], [address] Phone: [phone]   Fax:  [phone]  Name: [name] MRN: [id number] Date of Birth: [birth year]

## 2018-01-08 ENCOUNTER — Encounter: Payer: BC Managed Care – PPO | Admitting: Physical Therapy

## 2018-01-12 ENCOUNTER — Ambulatory Visit: Payer: BC Managed Care – PPO | Admitting: Physical Therapy

## 2018-01-15 ENCOUNTER — Telehealth: Payer: Self-pay | Admitting: Neurology

## 2018-01-15 ENCOUNTER — Ambulatory Visit: Payer: BC Managed Care – PPO | Admitting: Neurology

## 2018-01-15 ENCOUNTER — Encounter: Payer: Self-pay | Admitting: Neurology

## 2018-01-15 ENCOUNTER — Ambulatory Visit: Payer: BC Managed Care – PPO | Admitting: Physical Therapy

## 2018-01-15 VITALS — BP 140/79 | HR 91 | Ht 66.0 in | Wt 239.0 lb

## 2018-01-15 DIAGNOSIS — G43109 Migraine with aura, not intractable, without status migrainosus: Secondary | ICD-10-CM | POA: Diagnosis not present

## 2018-01-15 DIAGNOSIS — G43709 Chronic migraine without aura, not intractable, without status migrainosus: Secondary | ICD-10-CM | POA: Insufficient documentation

## 2018-01-15 DIAGNOSIS — IMO0002 Reserved for concepts with insufficient information to code with codable children: Secondary | ICD-10-CM | POA: Insufficient documentation

## 2018-01-15 MED ORDER — RIZATRIPTAN BENZOATE 10 MG PO TBDP: 10.0000 mg | ORAL_TABLET | ORAL | 6 refills | Status: DC | PRN

## 2018-01-15 MED ORDER — TOPIRAMATE 100 MG PO TABS: 100.0000 mg | ORAL_TABLET | Freq: Two times a day (BID) | ORAL | 11 refills | Status: DC

## 2018-01-15 MED ORDER — ONDANSETRON 4 MG PO TBDP: 4.0000 mg | ORAL_TABLET | Freq: Three times a day (TID) | ORAL | 6 refills | Status: DC | PRN

## 2018-01-15 NOTE — Telephone Encounter (Signed)
[name] would like to know if there is any way clinicals can be sent over from todays visit. The patient is coming in this afternoon. She said if they cannot then that will be fine, because she knows it is short notice. Fax [phone]

## 2018-01-15 NOTE — Progress Notes (Signed)
PATIENT: [name] DOB: 1985-10-28  Chief Complaint  Patient presents with  . Migraine    She has daily headaches.  The most severe headaches are often associated dizziness, nausea, vomiting, fatigue, brain fog and vision disturbances (blurred, floaters).  She has been using OTC Excedrin with limited relief.  Marland Kitchen. ENT    Dema SeverinNordbladh, Louise, PA-C - referring provider  . PCP    Juluis RainierBarnes, Elizabeth, MD     HISTORICAL  [name] is a 32 year old female, seen in request by ENT PA Aquilla HackerNordbladh, Louise, for evaluation of chronic headaches, vertigo, her primary care physician is Dr. Zachery DauerBarnes, Lanora ManisElizabeth, initial evaluation was on January 15, 2018.  She had a past medical history of hypertension, depression, Guillain-Barr syndrome in 2009, she presented with bilateral lower extremity paresthesia significant gait abnormality, falling, monophasic disease course, improved after 1 round of IVIG,  She reported history of migraine since age 32, her typical migraine are lateralized severe pounding headache with associated light noise sensitivity, often preceded by visual aura, but only happens rarely, since June 2019, without clear triggers, she began to have frequent headaches, from June to July 2019, she has severe headache almost daily basis, debilitating, often have nausea vomiting, severity of the headache is similar to her post LP headaches, she self medicate with multiple dose of Excedrin Migraine with limited help, presented to the emergency room on November 26, 2017, was given steroid tapering, which seems to help her headaches some,  But she still has severe lateralized pounding headache that is debilitating, nausea vomiting, with associated vertigo 1-2 times each week, she was referred for vestibular rehab, which has helped her some,  She reported gradual weight gain over the past few months, intermittent blurry vision,  REVIEW OF SYSTEMS: Full 14 system review of systems performed and  notable only for fatigue, hearing loss, ringing in ears, spinning sensation, blurred vision, eye pain, memory loss, confusion, headache, numbness, dizziness, depression, anxiety, decreased energy.  ALLERGIES: Allergies  Allergen Reactions  . Amoxicillin Other (See Comments)    Numbness and tingling at extremities.  Has patient had a PCN reaction causing immediate rash, facial/tongue/throat swelling, SOB or lightheadedness with hypotension: Yes (rash) Has patient had a PCN reaction causing severe rash involving mucus membranes or skin necrosis: No Has patient had a PCN reaction that required hospitalization: No Has patient had a PCN reaction occurring within the last 10 years: Yes If all of the above answers are "NO", then may proceed with Cephalosporin use.     HOME MEDICATIONS: Current Outpatient Medications  Medication Sig Dispense Refill  . aspirin-acetaminophen-caffeine (EXCEDRIN MIGRAINE) 250-250-65 MG tablet Take 2 tablets by mouth every 6 (six) hours as needed for headache or migraine.    Marland Kitchen. atorvastatin (LIPITOR) 20 MG tablet Take 20 mg by mouth daily.    Marland Kitchen. buPROPion (WELLBUTRIN XL) 300 MG 24 hr tablet Take 300 mg by mouth daily.  1  . cetirizine (ZYRTEC) 10 MG tablet Take 10 mg by mouth daily.    . cloNIDine HCl (KAPVAY) 0.1 MG TB12 ER tablet Take 0.1 mg by mouth at bedtime.  0  . drospirenone-ethinyl estradiol (YAZ,GIANVI,LORYNA) 3-0.02 MG tablet Take 1 tablet by mouth daily.    . fenofibrate 54 MG tablet Take 54 mg by mouth daily.    . fluticasone (FLONASE) 50 MCG/ACT nasal spray Place 1 spray into both nostrils daily as needed for allergies or rhinitis.    Marland Kitchen. LORazepam (ATIVAN) 0.5 MG tablet Take 1 mg by  mouth 2 (two) times daily.  1  . meclizine (ANTIVERT) 25 MG tablet Take 25 mg by mouth 3 (three) times daily as needed for dizziness.    . Multiple Vitamin (MULTIVITAMIN WITH MINERALS) TABS tablet Take 1 tablet by mouth daily.    . Probiotic Product (PROBIOTIC PO) Take 1 tablet  by mouth daily.      No current facility-administered medications for this visit.     PAST MEDICAL HISTORY: Past Medical History:  Diagnosis Date  . Depression with anxiety   . Guillain-Barre (HCC) 12/2007  . Hyperlipemia   . Migraine   . Vertigo     PAST SURGICAL HISTORY: Past Surgical History:  Procedure Laterality Date  . ANKLE SURGERY Left     FAMILY HISTORY: Family History  Problem Relation Age of Onset  . Hyperlipidemia Mother   . Breast cancer Mother   . Hyperlipidemia Father   . Melanoma Father   . Diabetes Maternal Grandmother     SOCIAL HISTORY: Social History   Socioeconomic History  . Marital status: Single    Spouse name: Not on file  . Number of children: 0  . Years of education: college  . Highest education level: Bachelor's degree (e.g., BA, AB, BS)  Occupational History  . Occupation: Runner, broadcasting/film/video  Social Needs  . Financial resource strain: Not on file  . Food insecurity:    Worry: Not on file    Inability: Not on file  . Transportation needs:    Medical: Not on file    Non-medical: Not on file  Tobacco Use  . Smoking status: Never Smoker  . Smokeless tobacco: Never Used  Substance and Sexual Activity  . Alcohol use: Yes    Comment: 1-2 drinks per month  . Drug use: No  . Sexual activity: Not on file  Lifestyle  . Physical activity:    Days per week: Not on file    Minutes per session: Not on file  . Stress: Not on file  Relationships  . Social connections:    Talks on phone: Not on file    Gets together: Not on file    Attends religious service: Not on file    Active member of club or organization: Not on file    Attends meetings of clubs or organizations: Not on file    Relationship status: Not on file  . Intimate partner violence:    Fear of current or ex partner: Not on file    Emotionally abused: Not on file    Physically abused: Not on file    Forced sexual activity: Not on file  Other Topics Concern  . Not on file    Social History Narrative   Lives at home alone.   Right-handed.   1 cup coffee per day.     PHYSICAL EXAM   Vitals:   01/15/18 0753  BP: 140/79  Pulse: 91  Weight: 239 lb (108.4 kg)  Height: 5\' 6"  (1.676 m)    Not recorded      Body mass index is 38.58 kg/m.  PHYSICAL EXAMNIATION:  Gen: NAD, conversant, well nourised, obese, well groomed                     Cardiovascular: Regular rate rhythm, no peripheral edema, warm, nontender. Eyes: Conjunctivae clear without exudates or hemorrhage Neck: Supple, no carotid bruits. Pulmonary: Clear to auscultation bilaterally   NEUROLOGICAL EXAM:  MENTAL STATUS: obese Speech:    Speech is normal; fluent  and spontaneous with normal comprehension.  Cognition:     Orientation to time, place and person     Normal recent and remote memory     Normal Attention span and concentration     Normal Language, naming, repeating,spontaneous speech     Fund of knowledge   CRANIAL NERVES: CN II: Visual fields are full to confrontation. Fundoscopic exam is normal with sharp discs and no vascular changes. Pupils are round equal and briskly reactive to light. CN III, IV, VI: extraocular movement are normal. No ptosis. CN V: Facial sensation is intact to pinprick in all 3 divisions bilaterally. Corneal responses are intact.  CN VII: Face is symmetric with normal eye closure and smile. CN VIII: Hearing is normal to rubbing fingers CN IX, X: Palate elevates symmetrically. Phonation is normal. CN XI: Head turning and shoulder shrug are intact CN XII: Tongue is midline with normal movements and no atrophy.  MOTOR: There is no pronator drift of out-stretched arms. Muscle bulk and tone are normal. Muscle strength is normal.  REFLEXES: Reflexes are 2+ and symmetric at the biceps, triceps, knees, and ankles. Plantar responses are flexor.  SENSORY: Intact to light touch, pinprick, positional sensation and vibratory sensation are intact in fingers  and toes.  COORDINATION: Rapid alternating movements and fine finger movements are intact. There is no dysmetria on finger-to-nose and heel-knee-shin.    GAIT/STANCE: Posture is normal. Gait is steady with normal steps, base, arm swing, and turning. Heel and toe walking are normal. Tandem gait is normal.  Romberg is absent.   DIAGNOSTIC DATA (LABS, IMAGING, TESTING) - I reviewed patient records, labs, notes, testing and imaging myself where available.   ASSESSMENT AND PLAN  [name] is a 32 y.o. female   Chronic migraine headaches  Start Topamax titrating to 100 mg twice a day as migraine prevention  Maxalt mixed with Zofran, Aleve as needed  Taper off daily Excedrin Migraine to avoid medicine rebound headaches   Levert FeinsteinYijun Venda Dice, M.D. Ph.D.  [facility] [address] [place], [place] [address] Ph: [phone] Fax: [phone]  CC:  ENT  Maura CrandallNordbladh, Louise, PA-C, Barnes, Elizabeth, MD

## 2018-01-15 NOTE — Telephone Encounter (Signed)
[facility] [phone]

## 2018-01-19 ENCOUNTER — Telehealth: Payer: Self-pay | Admitting: Neurology

## 2018-01-19 NOTE — Telephone Encounter (Signed)
Spoke to patient - she picked up her Topamax from the pharmacy and decided to break the tablet so she could titrate her dose slowly.  She is currently taking 25mg  BID.  She will continue until she has reached the prescribed dosage of 100mg  BID.  She will let us know how it is working for her migraines.  She is going to hold off on ordering new glasses until her migraines are under better control.

## 2018-01-19 NOTE — Telephone Encounter (Signed)
Pt requesting a call to discuss her dosing for topiramate (TOPAMAX) 100 MG tablet., would like to discuss how long to give topamax to start "kicking in"  Also stating she went to her eye doctor recently and her prescription in both eyes had changed within one month.

## 2018-01-20 ENCOUNTER — Encounter: Payer: BC Managed Care – PPO | Admitting: Physical Therapy

## 2018-01-22 ENCOUNTER — Encounter: Payer: BC Managed Care – PPO | Admitting: Physical Therapy

## 2018-04-06 ENCOUNTER — Encounter: Payer: Self-pay | Admitting: Neurology

## 2018-04-06 ENCOUNTER — Telehealth: Payer: Self-pay | Admitting: *Deleted

## 2018-04-06 ENCOUNTER — Ambulatory Visit: Payer: BC Managed Care – PPO | Admitting: Neurology

## 2018-04-06 VITALS — BP 144/90 | HR 96 | Ht 66.0 in | Wt 237.5 lb

## 2018-04-06 DIAGNOSIS — G43109 Migraine with aura, not intractable, without status migrainosus: Secondary | ICD-10-CM

## 2018-04-06 MED ORDER — VENLAFAXINE HCL ER 37.5 MG PO CP24
75.0000 mg | ORAL_CAPSULE | Freq: Every day | ORAL | 11 refills | Status: DC
Start: 2018-04-06 — End: 2018-04-28

## 2018-04-06 MED ORDER — TIZANIDINE HCL 4 MG PO TABS: 4.0000 mg | ORAL_TABLET | Freq: Four times a day (QID) | ORAL | 6 refills | Status: DC | PRN

## 2018-04-06 MED ORDER — ERENUMAB-AOOE 70 MG/ML ~~LOC~~ SOAJ: 70.0000 mg | SUBCUTANEOUS | 11 refills | Status: DC

## 2018-04-06 NOTE — Progress Notes (Signed)
PATIENT: [name] DOB: 01-03-1986  Chief Complaint  Patient presents with  . Migraine    She is currently taking topiramate 100mg  BID.  She has noticed tingling her fingers/toes, nausea and changes in taste since starting the medication.  She estimates still having 2-3 migraines per week.  Rizatriptan and Zofran work well for her pain/nausea.  She has been able to stay at work since starting these medications.      HISTORICAL  [name] is a 32 year old female, seen in request by ENT PA Aquilla Hacker, for evaluation of chronic headaches, vertigo, her primary care physician is Dr. Zachery Dauer, [facility], initial evaluation was on January 15, 2018.  She had a past medical history of hypertension, depression, Guillain-Barr syndrome in 2009, she presented with bilateral lower extremity paresthesia significant gait abnormality, falling, monophasic disease course, improved after 1 round of IVIG,  She reported history of migraine since age 25, her typical migraine are lateralized severe pounding headache with associated light noise sensitivity, often preceded by visual aura, but only happens rarely, since June 2019, without clear triggers, she began to have frequent headaches, from June to July 2019, she has severe headache almost daily basis, debilitating, often have nausea vomiting, severity of the headache is similar to her post LP headaches, she self medicate with multiple dose of Excedrin Migraine with limited help, presented to the emergency room on November 26, 2017, was given steroid tapering, which seems to help her headaches some,  But she still has severe lateralized pounding headache that is debilitating, nausea vomiting, with associated vertigo 1-2 times each week, she was referred for vestibular rehab, which has helped her some,  She reported gradual weight gain over the past few months, intermittent blurry vision,  UPDATE Apr 06 2018: She has started Topamax 100 mg twice  a day as migraine prevention since August 2019, reported moderate improvement, instead of daily headache, she is having 3 days headache in 1 week, Maxalt works well, but still think about 3 hours to improve her headaches, she also complains of intermittent blurry vision,  She was able to taper off frequent Excedrin Migraine use, complains of mental slowing with Topamax  REVIEW OF SYSTEMS: Full 14 system review of systems performed and notable only for as above, all rest review of the system were negative  ALLERGIES: Allergies  Allergen Reactions  . Amoxicillin Other (See Comments)    Numbness and tingling at extremities.  Has patient had a PCN reaction causing immediate rash, facial/tongue/throat swelling, SOB or lightheadedness with hypotension: Yes (rash) Has patient had a PCN reaction causing severe rash involving mucus membranes or skin necrosis: No Has patient had a PCN reaction that required hospitalization: No Has patient had a PCN reaction occurring within the last 10 years: Yes If all of the above answers are "NO", then may proceed with Cephalosporin use.     HOME MEDICATIONS: Current Outpatient Medications  Medication Sig Dispense Refill  . aspirin-acetaminophen-caffeine (EXCEDRIN MIGRAINE) 250-250-65 MG tablet Take 2 tablets by mouth every 6 (six) hours as needed for headache or migraine.    Marland Kitchen atorvastatin (LIPITOR) 20 MG tablet Take 20 mg by mouth daily.    Marland Kitchen buPROPion (WELLBUTRIN XL) 300 MG 24 hr tablet Take 300 mg by mouth daily.  1  . cetirizine (ZYRTEC) 10 MG tablet Take 10 mg by mouth daily.    . cloNIDine HCl (KAPVAY) 0.1 MG TB12 ER tablet Take 0.1 mg by mouth at bedtime.  0  .  drospirenone-ethinyl estradiol (YAZ,GIANVI,LORYNA) 3-0.02 MG tablet Take 1 tablet by mouth daily.    . fenofibrate 54 MG tablet Take 54 mg by mouth daily.    . fluticasone (FLONASE) 50 MCG/ACT nasal spray Place 1 spray into both nostrils daily as needed for allergies or rhinitis.    Marland Kitchen LORazepam  (ATIVAN) 0.5 MG tablet Take 1 mg by mouth 2 (two) times daily.  1  . meclizine (ANTIVERT) 25 MG tablet Take 25 mg by mouth 3 (three) times daily as needed for dizziness.    . Multiple Vitamin (MULTIVITAMIN WITH MINERALS) TABS tablet Take 1 tablet by mouth daily.    . ondansetron (ZOFRAN ODT) 4 MG disintegrating tablet Take 1 tablet (4 mg total) by mouth every 8 (eight) hours as needed. 20 tablet 6  . Probiotic Product (PROBIOTIC PO) Take 1 tablet by mouth daily.     . rizatriptan (MAXALT-MLT) 10 MG disintegrating tablet Take 1 tablet (10 mg total) by mouth as needed. May repeat in 2 hours if needed 15 tablet 6  . topiramate (TOPAMAX) 100 MG tablet Take 1 tablet (100 mg total) by mouth 2 (two) times daily. 60 tablet 11   No current facility-administered medications for this visit.     PAST MEDICAL HISTORY: Past Medical History:  Diagnosis Date  . Depression with anxiety   . Guillain-Barre (HCC) 12/2007  . Hyperlipemia   . Migraine   . Vertigo     PAST SURGICAL HISTORY: Past Surgical History:  Procedure Laterality Date  . ANKLE SURGERY Left     FAMILY HISTORY: Family History  Problem Relation Age of Onset  . Hyperlipidemia Mother   . Breast cancer Mother   . Hyperlipidemia Father   . Melanoma Father   . Diabetes Maternal Grandmother     SOCIAL HISTORY: Social History   Socioeconomic History  . Marital status: Single    Spouse name: Not on file  . Number of children: 0  . Years of education: college  . Highest education level: Bachelor's degree (e.g., BA, AB, BS)  Occupational History  . Occupation: Runner, broadcasting/film/video  Social Needs  . Financial resource strain: Not on file  . Food insecurity:    Worry: Not on file    Inability: Not on file  . Transportation needs:    Medical: Not on file    Non-medical: Not on file  Tobacco Use  . Smoking status: Never Smoker  . Smokeless tobacco: Never Used  Substance and Sexual Activity  . Alcohol use: Yes    Comment: 1-2 drinks per  month  . Drug use: No  . Sexual activity: Not on file  Lifestyle  . Physical activity:    Days per week: Not on file    Minutes per session: Not on file  . Stress: Not on file  Relationships  . Social connections:    Talks on phone: Not on file    Gets together: Not on file    Attends religious service: Not on file    Active member of club or organization: Not on file    Attends meetings of clubs or organizations: Not on file    Relationship status: Not on file  . Intimate partner violence:    Fear of current or ex partner: Not on file    Emotionally abused: Not on file    Physically abused: Not on file    Forced sexual activity: Not on file  Other Topics Concern  . Not on file  Social History Narrative  Lives at home alone.   Right-handed.   1 cup coffee per day.     PHYSICAL EXAM   Vitals:   04/06/18 1453  BP: (!) 144/90  Pulse: 96  Weight: 237 lb 8 oz (107.7 kg)  Height: 5\' 6"  (1.676 m)    Not recorded      Body mass index is 38.33 kg/m.  PHYSICAL EXAMNIATION:  Gen: NAD, conversant, well nourised, obese, well groomed                     Cardiovascular: Regular rate rhythm, no peripheral edema, warm, nontender. Eyes: Conjunctivae clear without exudates or hemorrhage Neck: Supple, no carotid bruits. Pulmonary: Clear to auscultation bilaterally   NEUROLOGICAL EXAM:  MENTAL STATUS: obese Speech:    Speech is normal; fluent and spontaneous with normal comprehension.  Cognition:     Orientation to time, place and person     Normal recent and remote memory     Normal Attention span and concentration     Normal Language, naming, repeating,spontaneous speech     Fund of knowledge   CRANIAL NERVES: CN II: Visual fields are full to confrontation. Fundoscopic exam is normal with sharp discs and was not able to appreciate venous pulsation. Pupils are round equal and briskly reactive to light. CN III, IV, VI: extraocular movement are normal. No ptosis. CN V:  Facial sensation is intact to pinprick in all 3 divisions bilaterally. Corneal responses are intact.  CN VII: Face is symmetric with normal eye closure and smile. CN VIII: Hearing is normal to rubbing fingers CN IX, X: Palate elevates symmetrically. Phonation is normal. CN XI: Head turning and shoulder shrug are intact CN XII: Tongue is midline with normal movements and no atrophy.  MOTOR: There is no pronator drift of out-stretched arms. Muscle bulk and tone are normal. Muscle strength is normal.  REFLEXES: Reflexes are 2+ and symmetric at the biceps, triceps, knees, and ankles. Plantar responses are flexor.  SENSORY: Intact to light touch, pinprick, positional sensation and vibratory sensation are intact in fingers and toes.  COORDINATION: Rapid alternating movements and fine finger movements are intact. There is no dysmetria on finger-to-nose and heel-knee-shin.    GAIT/STANCE: Posture is normal. Gait is steady with normal steps, base, arm swing, and turning. Heel and toe walking are normal. Tandem gait is normal.  Romberg is absent.   DIAGNOSTIC DATA (LABS, IMAGING, TESTING) - I reviewed patient records, labs, notes, testing and imaging myself where available.   ASSESSMENT AND PLAN  DIOSELINA BRUMBAUGH is a 32 y.o. female   Chronic migraine headaches  Keep Topamax titrating to 100 mg, tablets at nighttime to avoid the side effect of mental slowing, nausea,  Maxalt mixed with Zofran, Aleve as needed mixed together with Zofran, tizanidine  Add on Effexor XR 37.5mg , titrating to 75 mg every day as preventive medication  Aimovig 70mg  SQ q month as migraine prevention   Levert Feinstein, M.D. Ph.D.  [facility] [address] [place], [place] [address] Ph: [phone] Fax: [phone]  CC:  ENT  Maura Crandall, MD

## 2018-04-06 NOTE — Telephone Encounter (Signed)
Patient was prescribed Aimovig at today's visit and it is non-preferred on her insurance plan.  They will allow either Ajovy or Emgality without a prior authorization.

## 2018-04-07 MED ORDER — FREMANEZUMAB-VFRM 225 MG/1.5ML ~~LOC~~ SOSY: 225.0000 mg | PREFILLED_SYRINGE | SUBCUTANEOUS | 11 refills | Status: DC

## 2018-04-07 NOTE — Telephone Encounter (Signed)
Per vo by Dr. Terrace Arabia, change rx to Ajovy 225mg , inject once monthly.  New rx sent to pharmacy.  Aimovig rx voided.

## 2018-04-07 NOTE — Addendum Note (Signed)
Addended by: Lindell Spar C on: 04/07/2018 07:55 AM   Modules accepted: Orders

## 2018-04-28 ENCOUNTER — Other Ambulatory Visit: Payer: Self-pay | Admitting: Neurology

## 2018-05-01 ENCOUNTER — Emergency Department (HOSPITAL_COMMUNITY)
Admission: EM | Admit: 2018-05-01 | Discharge: 2018-05-01 | Disposition: A | Discharge status: Home / Self Care | Payer: BC Managed Care – PPO | Attending: Emergency Medicine | Admitting: Emergency Medicine

## 2018-05-01 ENCOUNTER — Encounter (HOSPITAL_COMMUNITY): Payer: Self-pay

## 2018-05-01 ENCOUNTER — Other Ambulatory Visit: Payer: Self-pay

## 2018-05-01 ENCOUNTER — Emergency Department (HOSPITAL_COMMUNITY): Payer: BC Managed Care – PPO

## 2018-05-01 DIAGNOSIS — Z79899 Other long term (current) drug therapy: Secondary | ICD-10-CM | POA: Diagnosis not present

## 2018-05-01 DIAGNOSIS — G43009 Migraine without aura, not intractable, without status migrainosus: Secondary | ICD-10-CM | POA: Diagnosis not present

## 2018-05-01 DIAGNOSIS — R51 Headache: Secondary | ICD-10-CM | POA: Diagnosis present

## 2018-05-01 MED ORDER — DEXAMETHASONE 2 MG PO TABS
10.0000 mg | ORAL_TABLET | Freq: Once | ORAL | Status: AC
  Administered 2018-05-01: 10 mg via ORAL
  Filled 2018-05-01: qty 2

## 2018-05-01 MED ORDER — METOCLOPRAMIDE HCL 5 MG/ML IJ SOLN
10.0000 mg | Freq: Once | INTRAMUSCULAR | Status: AC
  Administered 2018-05-01: 10 mg via INTRAVENOUS
  Filled 2018-05-01: qty 2

## 2018-05-01 MED ORDER — DIPHENHYDRAMINE HCL 50 MG/ML IJ SOLN
25.0000 mg | Freq: Once | INTRAMUSCULAR | Status: AC
  Administered 2018-05-01: 25 mg via INTRAVENOUS
  Filled 2018-05-01: qty 1

## 2018-05-01 MED ORDER — SODIUM CHLORIDE 0.9 % IV BOLUS
500.0000 mL | Freq: Once | INTRAVENOUS | Status: AC
  Administered 2018-05-01: 500 mL via INTRAVENOUS

## 2018-05-01 MED ORDER — KETOROLAC TROMETHAMINE 15 MG/ML IJ SOLN
15.0000 mg | Freq: Once | INTRAMUSCULAR | Status: AC
  Administered 2018-05-01: 15 mg via INTRAVENOUS
  Filled 2018-05-01: qty 1

## 2018-05-01 NOTE — ED Provider Notes (Signed)
[facility]-EMERGENCY DEPT Provider Note   CSN: [id number] Arrival date & time: 05/01/18  1418     History   Chief Complaint Chief Complaint  Patient presents with  . Migraine    HPI [name] is a 32 y.o. female.  The history is provided by the patient. No language interpreter was used.  Migraine    [name] is a 32 y.o. female who presents to the Emergency Department complaining of HA. She presents to the emergency department complaining of progressive headache. She has a history of migraines and has been evaluated by neurology. She has not had imaging of her brain and over 10 years. She reports daily headaches starting in June of this year with no clear proceeding factor. She saw her neurologist on November 4 and was started on Adobe and Effexor with PRN tizanidine. She has been taking her medications as prescribed and states worsening of her headache over the last two days. Pain is described as a diffuse pressure sensation with associated neck stiffness, photophobia, photophobia. She has had nausea and vomiting since yesterday. No fevers. She has constant vertigo, no change from prior. Symptoms are severe, constant, worsening.  Records reviewed in Epic.   Past Medical History:  Diagnosis Date  . Depression with anxiety   . Guillain-Barre (HCC) 12/2007  . Hyperlipemia   . Migraine   . Vertigo     Patient Active Problem List   Diagnosis Date Noted  . Chronic migraine with aura 01/15/2018    Past Surgical History:  Procedure Laterality Date  . ANKLE SURGERY Left      OB History   None      Home Medications    Prior to Admission medications   Medication Sig Start Date End Date Taking? Authorizing Provider  aspirin-acetaminophen-caffeine (EXCEDRIN MIGRAINE) [phone] MG tablet Take 2 tablets by mouth every 6 (six) hours as needed for headache or migraine.    [provider]  atorvastatin (LIPITOR) 20 MG tablet Take  20 mg by mouth daily.    [provider]  buPROPion (WELLBUTRIN XL) 300 MG 24 hr tablet Take 300 mg by mouth daily. 11/06/17   [provider]  cetirizine (ZYRTEC) 10 MG tablet Take 10 mg by mouth daily.    [provider]  cloNIDine HCl (KAPVAY) 0.1 MG TB12 ER tablet Take 0.1 mg by mouth at bedtime. 11/11/17   [provider]  drospirenone-ethinyl estradiol (YAZ,GIANVI,LORYNA) 3-0.02 MG tablet Take 1 tablet by mouth daily.    [provider]  fenofibrate 54 MG tablet Take 54 mg by mouth daily.    [provider]  fluticasone (FLONASE) 50 MCG/ACT nasal spray Place 1 spray into both nostrils daily as needed for allergies or rhinitis.    [provider]  Fremanezumab-vfrm (AJOVY) 225 MG/1.5ML SOSY Inject 225 mg into the skin every 30 (thirty) days. 04/07/18   Levert Feinstein, MD  LORazepam (ATIVAN) 0.5 MG tablet Take 1 mg by mouth 2 (two) times daily. 01/05/18   [provider]  meclizine (ANTIVERT) 25 MG tablet Take 25 mg by mouth 3 (three) times daily as needed for dizziness.    [provider]  Multiple Vitamin (MULTIVITAMIN WITH MINERALS) TABS tablet Take 1 tablet by mouth daily.    [provider]  ondansetron (ZOFRAN ODT) 4 MG disintegrating tablet Take 1 tablet (4 mg total) by mouth every 8 (eight) hours as needed. 01/15/18   Levert Feinstein, MD  Probiotic Product (PROBIOTIC  PO) Take 1 tablet by mouth daily.     [provider]  rizatriptan (MAXALT-MLT) 10 MG disintegrating tablet Take 1 tablet (10 mg total) by mouth as needed. May repeat in 2 hours if needed 01/15/18   Levert Feinstein, MD  tiZANidine (ZANAFLEX) 4 MG tablet Take 1 tablet (4 mg total) by mouth every 6 (six) hours as needed for muscle spasms. 04/06/18   Levert Feinstein, MD  topiramate (TOPAMAX) 100 MG tablet Take 1 tablet (100 mg total) by mouth 2 (two) times daily. 01/15/18   Levert Feinstein, MD  venlafaxine XR (EFFEXOR-XR) 37.5 MG 24 hr capsule TAKE 2 CAPSULES BY  MOUTH EVERY DAY WITH BREAKFAST 04/28/18   Levert Feinstein, MD    Family History Family History  Problem Relation Age of Onset  . Hyperlipidemia Mother   . Breast cancer Mother   . Hyperlipidemia Father   . Melanoma Father   . Diabetes Maternal Grandmother     Social History Social History   Tobacco Use  . Smoking status: Never Smoker  . Smokeless tobacco: Never Used  Substance Use Topics  . Alcohol use: Not Currently    Comment: 1-2 drinks per month  . Drug use: No     Allergies   Amoxicillin   Review of Systems Review of Systems  All other systems reviewed and are negative.    Physical Exam Updated Vital Signs BP 124/68   Pulse 86   Temp 97.6 F (36.4 C) (Oral)   Resp 16   Ht 5\' 6"  (1.676 m)   Wt 107 kg   SpO2 98%   BMI 38.09 kg/m   Physical Exam  Constitutional: She is oriented to person, place, and time. She appears well-developed and well-nourished.  HENT:  Head: Normocephalic and atraumatic.  Eyes: Pupils are equal, round, and reactive to light. EOM are normal.  Cardiovascular: Normal rate and regular rhythm.  No murmur heard. Pulmonary/Chest: Effort normal and breath sounds normal. No respiratory distress.  Abdominal: Soft. There is no tenderness. There is no rebound and no guarding.  Musculoskeletal: She exhibits no edema or tenderness.  Neurological: She is alert and oriented to person, place, and time. No cranial nerve deficit. She exhibits normal muscle tone. Coordination normal.  No pronator drift.  Visual fields grossly intact.  5/5 strength in all four extremities with sensation to light touch intact in all four extremities.    Skin: Skin is warm and dry.  Psychiatric: She has a normal mood and affect. Her behavior is normal.  Nursing note and vitals reviewed.    ED Treatments / Results  Labs (all labs ordered are listed, but only abnormal results are displayed) Labs Reviewed - No data to display  EKG None  Radiology Mr Brain Wo  Contrast (neuro Protocol)  Result Date: 05/01/2018 CLINICAL DATA:  Progressive headache EXAM: MRI HEAD WITHOUT CONTRAST TECHNIQUE: Multiplanar, multiecho pulse sequences of the brain and surrounding structures were obtained without intravenous contrast. COMPARISON:  MRI head 01/24/2008 FINDINGS: Brain: No acute infarction, hemorrhage, hydrocephalus, extra-axial collection or mass lesion. Vascular: Normal arterial flow voids Skull and upper cervical spine: Negative Sinuses/Orbits: Negative Other: None IMPRESSION: Negative MRI head Electronically Signed   By: Marlan Palau M.D.   On: 05/01/2018 20:07    Procedures Procedures (including critical care time)  Medications Ordered in ED Medications  dexamethasone (DECADRON) tablet 10 mg (has no administration in time range)  metoCLOPramide (REGLAN) injection 10 mg (10 mg Intravenous Given 05/01/18 1802)  ketorolac (TORADOL) 15  MG/ML injection 15 mg (15 mg Intravenous Given 05/01/18 1802)  diphenhydrAMINE (BENADRYL) injection 25 mg (25 mg Intravenous Given 05/01/18 1802)  sodium chloride 0.9 % bolus 500 mL (500 mLs Intravenous New Bag/Given 05/01/18 1802)     Initial Impression / Assessment and Plan / ED Course  I have reviewed the triage vital signs and the nursing notes.  Pertinent labs & imaging results that were available during my care of the patient were reviewed by me and considered in my medical decision making (see chart for details).     Patient with migraine headaches onset in June here for evaluation of recurrent and worsening headache. She is neurologically intact on examination. Given lack of imaging MRI was obtained, which was negative for acute process. Following treatment in the department her symptoms are resolved. Presentation is not consistent with meningitis, subarachnoid hemorrhage, dural sinus thrombosis. Plan to discharge home with outpatient follow-up and return precautions.  Final Clinical Impressions(s) / ED Diagnoses     Final diagnoses:  Migraine without aura and without status migrainosus, not intractable    ED Discharge Orders    None       Tilden Fossaees, Makailey Hodgkin, MD 05/01/18 2025

## 2018-05-01 NOTE — ED Triage Notes (Signed)
Patient c/o migraine, N/V ringing of her ears and neck stiffness since yesterday. Patient states she has sensitivity to light and sound. Patient states she has taken meds prescribed by neurologist with non relief.

## 2018-05-01 NOTE — ED Notes (Signed)
Pt returned from MRI at this time.  VS updated.  Pt c/o neck tension, requesting hot pack.  Hot pack provided.  Pt's father at bedside.  Pt denies needs at this time.

## 2018-06-09 ENCOUNTER — Telehealth: Payer: Self-pay | Admitting: *Deleted

## 2018-06-09 NOTE — Telephone Encounter (Addendum)
PA approved for Ajovy started on covermymeds (key: [id number]).  Pt has coverage with [employer] [phone]).    PA# [facility] [phone] Non-Grandfathered [id number] AJ.  Valid through 06/10/2019.

## 2018-07-07 ENCOUNTER — Other Ambulatory Visit: Payer: Self-pay

## 2018-07-07 ENCOUNTER — Telehealth: Payer: Self-pay | Admitting: Neurology

## 2018-07-07 ENCOUNTER — Encounter: Payer: Self-pay | Admitting: Neurology

## 2018-07-07 ENCOUNTER — Ambulatory Visit: Payer: BC Managed Care – PPO | Admitting: Neurology

## 2018-07-07 VITALS — BP 145/82 | HR 111 | Resp 16 | Ht 66.0 in | Wt 228.0 lb

## 2018-07-07 DIAGNOSIS — F329 Major depressive disorder, single episode, unspecified: Secondary | ICD-10-CM | POA: Diagnosis not present

## 2018-07-07 DIAGNOSIS — F32A Depression, unspecified: Secondary | ICD-10-CM

## 2018-07-07 DIAGNOSIS — G43109 Migraine with aura, not intractable, without status migrainosus: Secondary | ICD-10-CM

## 2018-07-07 MED ORDER — PROPRANOLOL HCL ER 80 MG PO CP24: 80.0000 mg | ORAL_CAPSULE | Freq: Every day | ORAL | 11 refills | Status: DC

## 2018-07-07 MED ORDER — RIZATRIPTAN BENZOATE 10 MG PO TBDP: 10.0000 mg | ORAL_TABLET | ORAL | 11 refills | Status: DC | PRN

## 2018-07-07 NOTE — Progress Notes (Signed)
PATIENT: [name] DOB: 1985/08/18  Chief Complaint  Patient presents with  . Migraines    Rm. 4. Sts. migraines  are less frequent, less severe since starting Ajovy. Also reports compliance with Effexor, Topamax, prn Maxalt, Aleve, Zofran./fim     HISTORICAL  Pamela Mascotmanda D Rubis is a 33 year old female, seen in request by ENT PA Aquilla HackerNordbladh, Louise, for evaluation of chronic headaches, vertigo, her primary care physician is Dr. Zachery DauerBarnes, Lanora ManisElizabeth, initial evaluation was on January 15, 2018.  She had a past medical history of hypertension, depression, Guillain-Barr syndrome in 2009, she presented with bilateral lower extremity paresthesia significant gait abnormality, falling, monophasic disease course, improved after 1 round of IVIG,  She reported history of migraine since age 33, her typical migraine are lateralized severe pounding headache with associated light noise sensitivity, often preceded by visual aura, but only happens rarely, since June 2019, without clear triggers, she began to have frequent headaches, from June to July 2019, she has severe headache almost daily basis, debilitating, often have nausea vomiting, severity of the headache is similar to her post LP headaches, she self medicate with multiple dose of Excedrin Migraine with limited help, presented to the emergency room on November 26, 2017, was given steroid tapering, which seems to help her headaches some,  But she still has severe lateralized pounding headache that is debilitating, nausea vomiting, with associated vertigo 1-2 times each week, she was referred for vestibular rehab, which has helped her some,  She reported gradual weight gain over the past few months, intermittent blurry vision,  UPDATE Apr 06 2018: She has started Topamax 100 mg twice a day as migraine prevention since August 2019, reported moderate improvement, instead of daily headache, she is having 3 days headache in 1 week, Maxalt works well, but  still think about 3 hours to improve her headaches, she also complains of intermittent blurry vision,  She was able to taper off frequent Excedrin Migraine use, complains of mental slowing with Topamax  UPDATE Jul 07 2018: She started a job injection since November 2019, had 3 consecutive months, which has been helpful, the benefit only lasted about 15 days, the end of the injection, she has uncontrolled headache days in a month, bilateral TMJ muscle tightness, neck muscle tension, retro-orbital area severe pounding headaches with light noise sensitivity,  She has stopped her frequent daily Aleve use, used up Maxalt 7 tablets in 1 month, 5 days Ingal months, she has to stay in bed due to her migraine, with 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  REVIEW OF SYSTEMS: Full 14 system review of systems performed and notable only for as above, all rest review of the system were negative  ALLERGIES: Allergies  Allergen Reactions  . Amoxicillin Other (See Comments)    Numbness and tingling at extremities.  Has patient had a PCN reaction causing immediate rash, facial/tongue/throat swelling, SOB or lightheadedness with hypotension: Yes (rash) Has patient had a PCN reaction causing severe rash involving mucus membranes or skin necrosis: No Has patient had a PCN reaction that required hospitalization: No Has patient had a PCN reaction occurring within the last 10 years: Yes If all of the above answers are "NO", then may proceed with Cephalosporin use.     HOME MEDICATIONS: Current  Outpatient Medications  Medication Sig Dispense Refill  . atorvastatin (LIPITOR) 20 MG tablet Take 20 mg by mouth daily.    Marland Kitchen buPROPion (WELLBUTRIN XL) 300 MG 24 hr tablet Take 300 mg by mouth daily.  1  .  cetirizine (ZYRTEC) 10 MG tablet Take 10 mg by mouth daily.    . cloNIDine HCl (KAPVAY) 0.1 MG TB12 ER tablet Take 0.1 mg by mouth every morning.   0  . drospirenone-ethinyl estradiol (YAZ,GIANVI,LORYNA) 3-0.02 MG tablet Take 1 tablet by mouth daily.    . fenofibrate 54 MG tablet Take 108 mg by mouth daily.     . fluticasone (FLONASE) 50 MCG/ACT nasal spray Place 1 spray into both nostrils daily as needed for allergies or rhinitis.    . Fremanezumab-vfrm (AJOVY) 225 MG/1.5ML SOSY Inject 225 mg into the skin every 30 (thirty) days. 1 Syringe 11  . LORazepam (ATIVAN) 0.5 MG tablet Take 0.5 mg by mouth 2 (two) times daily.   1  . meclizine (ANTIVERT) 25 MG tablet Take 25 mg by mouth 3 (three) times daily as needed for dizziness.    . Multiple Vitamin (MULTIVITAMIN WITH MINERALS) TABS tablet Take 1 tablet by mouth daily.    . ondansetron (ZOFRAN ODT) 4 MG disintegrating tablet Take 1 tablet (4 mg total) by mouth every 8 (eight) hours as needed. 20 tablet 6  . Probiotic Product (PROBIOTIC PO) Take 1 tablet by mouth daily.     . rizatriptan (MAXALT-MLT) 10 MG disintegrating tablet Take 1 tablet (10 mg total) by mouth as needed. May repeat in 2 hours if needed 15 tablet 6  . tiZANidine (ZANAFLEX) 4 MG tablet Take 1 tablet (4 mg total) by mouth every 6 (six) hours as needed for muscle spasms. 30 tablet 6  . topiramate (TOPAMAX) 100 MG tablet Take 1 tablet (100 mg total) by mouth 2 (two) times daily. (Patient taking differently: Take 200 mg by mouth at bedtime. ) 60 tablet 11  . venlafaxine XR (EFFEXOR-XR) 37.5 MG 24 hr capsule TAKE 2 CAPSULES BY MOUTH EVERY DAY WITH BREAKFAST (Patient taking differently: Take 75 mg by mouth at bedtime. ) 180 capsule 3   No current facility-administered medications for this visit.     PAST MEDICAL HISTORY: Past Medical History:  Diagnosis Date  . Depression with anxiety   . Guillain-Barre (HCC) 12/2007  . Hyperlipemia   . Migraine   . Vertigo     PAST SURGICAL  HISTORY: Past Surgical History:  Procedure Laterality Date  . ANKLE SURGERY Left     FAMILY HISTORY: Family History  Problem Relation Age of Onset  . Hyperlipidemia Mother   . Breast cancer Mother   . Hyperlipidemia Father   . Melanoma Father   . Diabetes Maternal Grandmother     SOCIAL HISTORY: Social History   Socioeconomic History  . Marital status: Single    Spouse name: Not on file  . Number of children: 0  . Years of education: college  . Highest education level: Bachelor's degree (e.g., BA, AB, BS)  Occupational History  . Occupation: Runner, broadcasting/film/video  Social Needs  . Financial resource strain: Not on file  . Food insecurity:    Worry: Not on file    Inability: Not on file  . Transportation needs:    Medical: Not on file    Non-medical: Not on file  Tobacco Use  . Smoking status: Never Smoker  . Smokeless tobacco: Never Used  Substance and Sexual Activity  . Alcohol use:  Not Currently    Comment: 1-2 drinks per month  . Drug use: No  . Sexual activity: Not on file  Lifestyle  . Physical activity:    Days per week: Not on file    Minutes per session: Not on file  . Stress: Not on file  Relationships  . Social connections:    Talks on phone: Not on file    Gets together: Not on file    Attends religious service: Not on file    Active member of club or organization: Not on file    Attends meetings of clubs or organizations: Not on file    Relationship status: Not on file  . Intimate partner violence:    Fear of current or ex partner: Not on file    Emotionally abused: Not on file    Physically abused: Not on file    Forced sexual activity: Not on file  Other Topics Concern  . Not on file  Social History Narrative   Lives at home alone.   Right-handed.   1 cup coffee per day.     PHYSICAL EXAM   Vitals:   07/07/18 1445  BP: (!) 145/82  Pulse: (!) 111  Resp: 16  Weight: 228 lb (103.4 kg)  Height: 5\' 6"  (1.676 m)    Not recorded      Body  mass index is 36.8 kg/m.  PHYSICAL EXAMNIATION:  Gen: NAD, conversant, well nourised, obese, well groomed                     Cardiovascular: Regular rate rhythm, no peripheral edema, warm, nontender. Eyes: Conjunctivae clear without exudates or hemorrhage Neck: Supple, no carotid bruits. Pulmonary: Clear to auscultation bilaterally   NEUROLOGICAL EXAM:  MENTAL STATUS: obese Speech:    Speech is normal; fluent and spontaneous with normal comprehension.  Cognition:     Orientation to time, place and person     Normal recent and remote memory     Normal Attention span and concentration     Normal Language, naming, repeating,spontaneous speech     Fund of knowledge   CRANIAL NERVES: CN II: Visual fields are full to confrontation. Fundoscopic exam is normal with sharp discs and was not able to appreciate venous pulsation. Pupils are round equal and briskly reactive to light. CN III, IV, VI: extraocular movement are normal. No ptosis. CN V: Facial sensation is intact to pinprick in all 3 divisions bilaterally. Corneal responses are intact.  CN VII: Face is symmetric with normal eye closure and smile. CN VIII: Hearing is normal to rubbing fingers CN IX, X: Palate elevates symmetrically. Phonation is normal. CN XI: Head turning and shoulder shrug are intact CN XII: Tongue is midline with normal movements and no atrophy.  MOTOR: There is no pronator drift of out-stretched arms. Muscle bulk and tone are normal. Muscle strength is normal.  REFLEXES: Reflexes are 2+ and symmetric at the biceps, triceps, knees, and ankles. Plantar responses are flexor.  SENSORY: Intact to light touch, pinprick, positional sensation and vibratory sensation are intact in fingers and toes.  COORDINATION: Rapid alternating movements and fine finger movements are intact. There is no dysmetria on finger-to-nose and heel-knee-shin.    GAIT/STANCE: Posture is normal. Gait is steady with normal steps, base,  arm swing, and turning. Heel and toe walking are normal. Tandem gait is normal.  Romberg is absent.   DIAGNOSTIC DATA (LABS, IMAGING, TESTING) - I reviewed patient records, labs, notes, testing  and imaging myself where available.   ASSESSMENT AND PLAN  CREASIE VANDYNE is a 33 y.o. female   Chronic migraine headaches Depression anxiety, polypharmacy treatment   Keep Topamax 100 mg 2 tablets every night  Continue Ajovy monthly as preventive medication  Stop Effexor, did not help her headache much, she is already on polypharmacy treatment, occluding Wellbutrin 300 mg daily  Maxalt mixed with Zofran, Aleve as needed mixed together with Zofran, tizanidine for severe migraine headache  Despite above treatment, she continued dealing with significant headache on a monthly basis, will try Botox injection as preventive medication start preauthorization today  Also add on Inderal LA 80 mg daily as preventive medication   Levert Feinstein, M.D. Ph.D.  [facility] [address] [place], [place] [address] Ph: [phone] Fax: [phone]  CC:  ENT  Maura Crandall, MD

## 2018-07-07 NOTE — Telephone Encounter (Signed)
4 wk botox inj °

## 2018-07-08 ENCOUNTER — Encounter: Payer: Self-pay | Admitting: Neurology

## 2018-07-08 ENCOUNTER — Encounter: Payer: Self-pay | Admitting: *Deleted

## 2018-07-09 NOTE — Telephone Encounter (Signed)
I called to schedule the patient but she did not answer so I left a VM asking her to call me back.  

## 2018-07-09 NOTE — Telephone Encounter (Signed)
Pt has called Danielle back, she is asking for a returned call

## 2018-08-10 ENCOUNTER — Encounter (HOSPITAL_COMMUNITY): Payer: Self-pay | Admitting: Emergency Medicine

## 2018-08-10 ENCOUNTER — Other Ambulatory Visit: Payer: Self-pay

## 2018-08-10 ENCOUNTER — Emergency Department (HOSPITAL_COMMUNITY)
Admission: EM | Admit: 2018-08-10 | Discharge: 2018-08-10 | Disposition: A | Discharge status: Home / Self Care | Payer: BC Managed Care – PPO | Attending: Emergency Medicine | Admitting: Emergency Medicine

## 2018-08-10 DIAGNOSIS — G43009 Migraine without aura, not intractable, without status migrainosus: Secondary | ICD-10-CM | POA: Diagnosis not present

## 2018-08-10 DIAGNOSIS — Z79899 Other long term (current) drug therapy: Secondary | ICD-10-CM | POA: Insufficient documentation

## 2018-08-10 DIAGNOSIS — R51 Headache: Secondary | ICD-10-CM | POA: Diagnosis present

## 2018-08-10 MED ORDER — SODIUM CHLORIDE 0.9 % IV BOLUS
1000.0000 mL | Freq: Once | INTRAVENOUS | Status: AC
  Administered 2018-08-10: 1000 mL via INTRAVENOUS

## 2018-08-10 MED ORDER — DIPHENHYDRAMINE HCL 50 MG/ML IJ SOLN
25.0000 mg | Freq: Once | INTRAMUSCULAR | Status: AC
Start: 2018-08-10 — End: 2018-08-10
  Administered 2018-08-10: 25 mg via INTRAVENOUS
  Filled 2018-08-10: qty 1

## 2018-08-10 MED ORDER — METOCLOPRAMIDE HCL 5 MG/ML IJ SOLN
10.0000 mg | Freq: Once | INTRAMUSCULAR | Status: AC
  Administered 2018-08-10: 10 mg via INTRAVENOUS
  Filled 2018-08-10: qty 2

## 2018-08-10 MED ORDER — KETOROLAC TROMETHAMINE 15 MG/ML IJ SOLN
15.0000 mg | Freq: Once | INTRAMUSCULAR | Status: AC
  Administered 2018-08-10: 15 mg via INTRAVENOUS
  Filled 2018-08-10: qty 1

## 2018-08-10 NOTE — ED Triage Notes (Signed)
Worsening dizziness, neck pain, and nausea with migraine since Friday.

## 2018-08-10 NOTE — ED Provider Notes (Signed)
[facility]-EMERGENCY DEPT Provider Note   CSN: [id number] Arrival date & time: 08/10/18  1227    History   Chief Complaint Chief Complaint  Patient presents with  . Migraine    HPI DYMON Stone is a 33 y.o. female presenting today for migraine.  Patient states that she has a history of chronic migraines and has been evaluated by neurology in the past.  Patient states that she is currently followed by [facility] neurology.  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  Patient describes her migraine as a generalized pressure in her head constant severe in intensity radiating to her neck made worse with light and sound and without alleviating factors.  Patient also endorses vertigo with her migraine.  States that it is worse with turning her head.  Patient states that all of these symptoms are consistent with her normal migraine.  She denies abnormal features of her migraine today.  Denies fever, injury/fall, vision changes, numbness/weakness or tingling or any other concerns today.  Denies chance of pregnancy today, does not wish to have pregnancy testing.  No history of CKD or gastric ulcers.    HPI  Past Medical History:  Diagnosis Date  . Depression with anxiety   . Guillain-Barre (HCC) 12/2007  . Hyperlipemia   . Migraine   . Vertigo     Patient Active Problem List   Diagnosis Date Noted  . Chronic migraine 01/15/2018    Past Surgical History:  Procedure Laterality Date  . ANKLE SURGERY Left      OB History   No obstetric history on file.      Home Medications    Prior to Admission medications   Medication Sig Start Date End Date Taking? Authorizing Provider  AIMOVIG 70 MG/ML SOAJ Inject 70 mg into the skin every 28 (twenty-eight) days. 07/29/18  Yes [provider]  atorvastatin (LIPITOR) 20 MG tablet Take 20 mg by mouth daily.   Yes  [provider]  buPROPion (WELLBUTRIN XL) 300 MG 24 hr tablet Take 300 mg by mouth daily. 11/06/17  Yes [provider]  cetirizine (ZYRTEC) 10 MG tablet Take 10 mg by mouth daily.   Yes [provider]  cloNIDine HCl (KAPVAY) 0.1 MG TB12 ER tablet Take 0.1 mg by mouth every morning.  11/11/17  Yes [provider]  drospirenone-ethinyl estradiol (NIKKI) 3-0.02 MG tablet Take 1 tablet by mouth daily.   Yes [provider]  fenofibrate 54 MG tablet Take 108 mg by mouth daily.    Yes [provider]  LORazepam (ATIVAN) 0.5 MG tablet Take 0.5 mg by mouth 2 (two) times daily.  01/05/18  Yes [provider]  meclizine (ANTIVERT) 25 MG tablet Take 25 mg by mouth 3 (three) times daily as needed for dizziness.   Yes [provider]  Multiple Vitamin (MULTIVITAMIN WITH MINERALS) TABS tablet Take 1 tablet by mouth daily.   Yes [provider]  ondansetron (ZOFRAN ODT) 4 MG disintegrating tablet Take 1 tablet (4 mg total) by mouth every 8 (eight) hours as needed. 01/15/18  Yes Levert Feinstein, MD  Probiotic Product (PROBIOTIC PO) Take 1 tablet by mouth daily.    Yes [provider]  promethazine (PHENERGAN) 12.5 MG tablet Take 12.5 mg by mouth every 6 (six) hours as needed. for nausea 07/29/18  Yes [provider]  propranolol ER (INDERAL LA) 80 MG  24 hr capsule Take 1 capsule (80 mg total) by mouth daily. 07/07/18  Yes Levert Feinstein, MD  topiramate (TOPAMAX) 100 MG tablet Take 1 tablet (100 mg total) by mouth 2 (two) times daily. Patient taking differently: Take 200 mg by mouth at bedtime.  01/15/18  Yes Levert Feinstein, MD  zolmitriptan (ZOMIG) 5 MG tablet Take 5 mg by mouth 2 (two) times daily as needed for migraine. 07/29/18  Yes [provider]  Fremanezumab-vfrm (AJOVY) 225 MG/1.5ML SOSY Inject 225 mg into the skin every 30 (thirty) days. Patient not taking: Reported on 08/10/2018 04/07/18   Levert Feinstein, MD  rizatriptan  (MAXALT-MLT) 10 MG disintegrating tablet Take 1 tablet (10 mg total) by mouth as needed. May repeat in 2 hours if needed Patient not taking: Reported on 08/10/2018 07/07/18   Levert Feinstein, MD  tiZANidine (ZANAFLEX) 4 MG tablet Take 1 tablet (4 mg total) by mouth every 6 (six) hours as needed for muscle spasms. Patient not taking: Reported on 08/10/2018 04/06/18   Levert Feinstein, MD    Family History Family History  Problem Relation Age of Onset  . Hyperlipidemia Mother   . Breast cancer Mother   . Hyperlipidemia Father   . Melanoma Father   . Diabetes Maternal Grandmother     Social History Social History   Tobacco Use  . Smoking status: Never Smoker  . Smokeless tobacco: Never Used  Substance Use Topics  . Alcohol use: Not Currently    Comment: 1-2 drinks per month  . Drug use: No     Allergies   Amoxicillin   Review of Systems Review of Systems  Constitutional: Negative.  Negative for chills and fever.  Eyes: Negative.  Negative for visual disturbance.  Respiratory: Negative.  Negative for shortness of breath.   Cardiovascular: Negative.  Negative for chest pain.  Gastrointestinal: Negative.  Negative for abdominal pain, nausea and vomiting.  Musculoskeletal: Negative for arthralgias and myalgias.  Neurological: Positive for dizziness (States normal with her migraines) and headaches. Negative for syncope, speech difficulty, weakness and numbness.  All other systems reviewed and are negative.  Physical Exam Updated Vital Signs BP 130/81 (BP Location: Right Arm)   Pulse 79   Temp 98.5 F (36.9 C) (Oral)   Resp 16   SpO2 100%   Physical Exam Constitutional:      General: She is not in acute distress.    Appearance: Normal appearance. She is well-developed. She is not ill-appearing or diaphoretic.  HENT:     Head: Normocephalic and atraumatic. No raccoon eyes, Battle's sign, abrasion or contusion.     Jaw: There is normal jaw occlusion. No trismus.     Right Ear: Tympanic  membrane, ear canal and external ear normal. No hemotympanum.     Left Ear: Tympanic membrane, ear canal and external ear normal. No hemotympanum.     Ears:     Comments: Hearing grossly intact bilaterally    Nose: Nose normal. No rhinorrhea.     Right Nostril: No epistaxis.     Left Nostril: No epistaxis.     Mouth/Throat:     Lips: Pink.     Mouth: Mucous membranes are moist.     Pharynx: Oropharynx is clear. Uvula midline.  Eyes:     General: Vision grossly intact. Gaze aligned appropriately.     Extraocular Movements: Extraocular movements intact.     Right eye: No nystagmus.     Left eye: No nystagmus.     Conjunctiva/sclera: Conjunctivae  normal.     Pupils: Pupils are equal, round, and reactive to light.     Comments: Visual fields grossly intact bilaterally  Neck:     Musculoskeletal: Full passive range of motion without pain, normal range of motion and neck supple. No neck rigidity, spinous process tenderness or muscular tenderness.     Trachea: Trachea and phonation normal. No tracheal tenderness or tracheal deviation.     Meningeal: Brudzinski's sign and Kernig's sign absent.  Cardiovascular:     Rate and Rhythm: Normal rate and regular rhythm.     Pulses: Normal pulses.          Dorsalis pedis pulses are 2+ on the right side and 2+ on the left side.       Posterior tibial pulses are 2+ on the right side and 2+ on the left side.     Heart sounds: Normal heart sounds.  Pulmonary:     Effort: Pulmonary effort is normal. No respiratory distress.     Breath sounds: Normal breath sounds and air entry. No wheezing or rhonchi.  Abdominal:     General: Bowel sounds are normal. There is no distension.     Palpations: Abdomen is soft.     Tenderness: There is no abdominal tenderness. There is no guarding or rebound.  Musculoskeletal:     Right lower leg: Normal. She exhibits no tenderness. No edema.     Left lower leg: Normal. She exhibits no tenderness. No edema.     Comments:  No midline C/T/L spinal tenderness to palpation, no paraspinal muscle tenderness, no deformity, crepitus, or step-off noted. No sign of injury to the neck or back.  Feet:     Right foot:     Protective Sensation: 5 sites tested. 5 sites sensed.     Left foot:     Protective Sensation: 5 sites tested. 5 sites sensed.  Skin:    General: Skin is warm and dry.     Capillary Refill: Capillary refill takes less than 2 seconds.  Neurological:     General: No focal deficit present.     Mental Status: She is alert and oriented to person, place, and time.     GCS: GCS eye subscore is 4. GCS verbal subscore is 5. GCS motor subscore is 6.     Comments: Mental Status: Alert, oriented, thought content appropriate, able to give a coherent history. Speech fluent without evidence of aphasia. Able to follow 2 step commands without difficulty. Cranial Nerves: II: Peripheral visual fields grossly normal, pupils equal, round, reactive to light III,IV, VI: ptosis not present, extra-ocular motions intact bilaterally V,VII: smile symmetric, eyebrows raise symmetric, facial light touch sensation equal VIII: hearing grossly normal to voice X: uvula elevates symmetrically XI: bilateral shoulder shrug symmetric and strong XII: midline tongue extension without fassiculations Motor: Normal tone. 5/5 strength in upper and lower extremities bilaterally including strong and equal grip strength and dorsiflexion/plantar flexion Sensory: Sensation intact to light touch in all extremities.Negative Romberg.  Deep Tendon Reflexes: 2+ and symmetric in the biceps and patella.  No clonus of the feet. Cerebellar: normal finger-to-nose maze with bilateral upper extremities. Normal heel-to -shin balance bilaterally of the lower extremity. No pronator drift.  Gait: normal gait and balance CV: distal pulses palpable throughout  Psychiatric:        Mood and Affect: Mood normal.        Behavior: Behavior is cooperative.      ED Treatments / Results  Labs (all  labs ordered are listed, but only abnormal results are displayed) Labs Reviewed - No data to display  EKG None  Radiology No results found.  Procedures Procedures (including critical care time)  Medications Ordered in ED Medications  metoCLOPramide (REGLAN) injection 10 mg (10 mg Intravenous Given 08/10/18 1741)  ketorolac (TORADOL) 15 MG/ML injection 15 mg (15 mg Intravenous Given 08/10/18 1742)  diphenhydrAMINE (BENADRYL) injection 25 mg (25 mg Intravenous Given 08/10/18 1741)  sodium chloride 0.9 % bolus 1,000 mL (1,000 mLs Intravenous New Bag/Given 08/10/18 1745)     Initial Impression / Assessment and Plan / ED Course  I have reviewed the triage vital signs and the nursing notes.  Pertinent labs & imaging results that were available during my care of the patient were reviewed by me and considered in my medical decision making (see chart for details).    33 year old female arrives today with complaint of migraine x3 days.  She denies abnormal features of her migraine today, reports her dizziness is normal for her with migraines, chart review shows this is documented previously.  Onset after strong smell at her school.  Initial evaluation reveals well-appearing 33 year old female.  Physical examination reassuring.  No focal neuro deficits.  Imaging not indicated at this time.  Will treat with migraine cocktail, discussed with Dr. Effie Shy who agrees. - Headache treated today with migraine cocktail patient endorses improvement of all of her symptoms and is requesting discharge.  She denies dizziness on reevaluation. - On re-evaluation, patient sleeping comfortably easily arousable to voice. The patient denies any neurologic symptoms such as visual changes, focal numbness/weakness, balance problems, confusion, or speech difficulty to suggest a life-threatening intracranial process such as intracranial hemorrhage or mass. The patient has no clotting  risk factors thus venous sinus thrombosis is unlikely. No fevers or nuchal rigidity to suggest meningitis. Patient is afebrile, non-toxic and well appearing. Reassuring neuro exam, normal gait around room and back. No cranial deficits, no speech deficits, negative pronator drift, normal/equal strength to all extremities.   PCP and neurology follow up strongly encouraged. I have reviewed return precautions including development of fever, nausea/vomiting or neurologic symptoms, vision changes, confusion, lethargy, difficulty speaking/walking, or other new/worsening/concerning symptoms.  At this time there does not appear to be any evidence of an acute emergency medical condition and the patient appears stable for discharge with appropriate outpatient follow up. Diagnosis was discussed with patient who verbalizes understanding of care plan and is agreeable to discharge. I have discussed return precautions with patient and mother who verbalize understanding of return precautions. Patient encouraged to follow-up with their PCP and neurology. All questions answered.   Note: Portions of this report may have been transcribed using voice recognition software. Every effort was made to ensure accuracy; however, inadvertent computerized transcription errors may still be present. Final Clinical Impressions(s) / ED Diagnoses   Final diagnoses:  Migraine without aura and without status migrainosus, not intractable    ED Discharge Orders    None       Elizabeth Palau 08/10/18 1901    Mancel Bale, MD 08/12/18 1007

## 2018-08-10 NOTE — Discharge Instructions (Addendum)
You have been diagnosed today with Headache.  At this time there does not appear to be the presence of an emergent medical condition, however there is always the potential for conditions to change. Please read and follow the below instructions.  Please return to the Emergency Department immediately for any new or worsening symptoms. Please be sure to follow up with your Primary Care Provider within one week regarding your visit today; please call their office to schedule an appointment even if you are feeling better for a follow-up visit. Please follow-up with your primary care provider and your neurology regarding your visit today. Call their offices tomorrow to schedule your appointment for sometime within one week.  Get help right away if: Your migraine becomes severe. You have a fever. You have a stiff neck. You have vision loss. Your muscles feel weak or like you cannot control them. You start to lose your balance often. You develop trouble walking. You faint. You have symptoms that are different from your migraine in any way. Any new or worsening symptoms.  Please read the additional information packets attached to your discharge summary.

## 2018-08-28 ENCOUNTER — Encounter: Payer: Self-pay | Admitting: Physical Therapy

## 2018-08-28 NOTE — Therapy (Unsigned)
[facility] [address] [place], [place], [address] Phone: [phone]   Fax:  [phone]  Patient Details  Name: [name] MRN: [id number] Date of Birth: 26-Aug-1985 Referring Provider:  No ref. provider found  Encounter Date: 08/28/2018  PHYSICAL THERAPY DISCHARGE SUMMARY  Visits from Start of Care: 3  Current functional level related to goals / functional outcomes: Patient did not return to clinic for assessment   Remaining deficits: Patient did not return to clinic for assessment   Education / Equipment: Patient did not return to clinic for assessment  Plan: Patient agrees to discharge.  Patient goals were not met. Patient is being discharged due to not returning since the last visit.  ?????      Rexanne Mano, PT 08/28/2018, 1:52 PM  [facility] [address] [place], [place], [address] Phone: [phone]   Fax:  [phone]

## 2018-09-02 ENCOUNTER — Ambulatory Visit: Payer: BC Managed Care – PPO | Admitting: Neurology

## 2018-12-15 IMAGING — MR MR HEAD W/O CM
10 series · 44 of 48 positions shown · IV contrast (Yes)
Comparison: MRI head 01/24/2008

CLINICAL DATA: Progressive headache

EXAM:
MRI HEAD WITHOUT CONTRAST
TECHNIQUE: Multiplanar, multiecho pulse sequences of the brain and surrounding
structures were obtained without intravenous contrast.

[Series 3: T1 · sagittal · 5.0mm · 0.47mm/px · 2 of 24 slices shown]
[im 1/24]
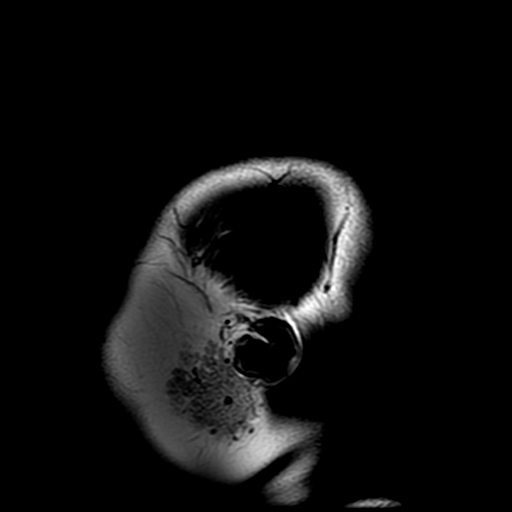
[im 24/24]
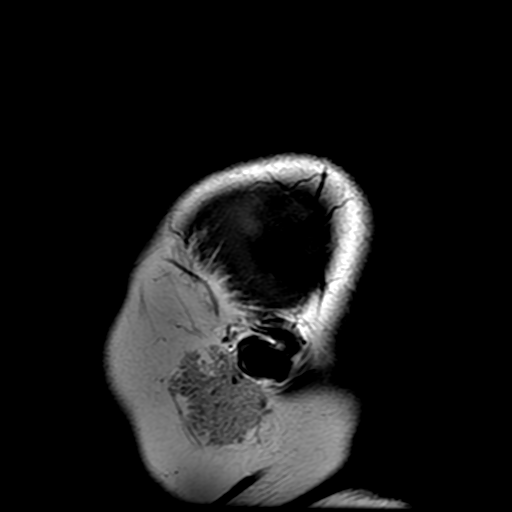

[Series 4: DWI · axial · 3.0mm · 1.09mm/px · z∈[+17,+161]mm · 11 of 98 slices shown (1 of 4)]
[im 1/98]
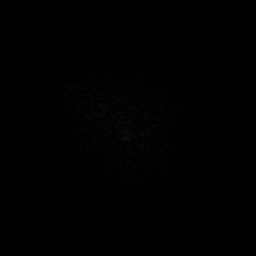
[im 10/98]
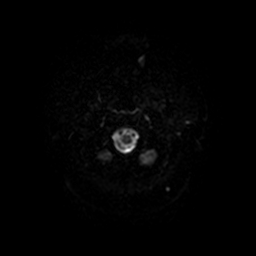
[im 20/98]
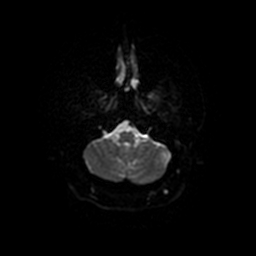
[im 30/98]
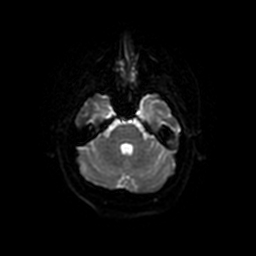
[im 39/98]
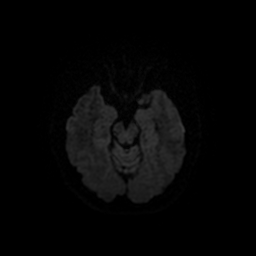
[im 49/98]
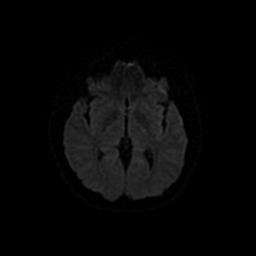
[im 59/98]
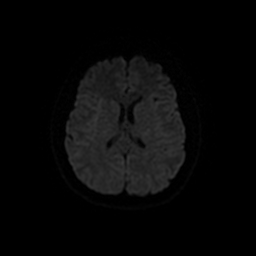
[im 68/98]
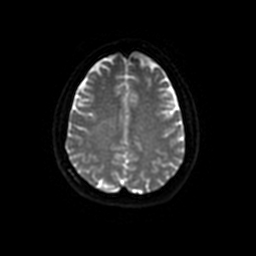
[im 78/98]
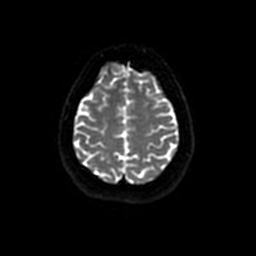
[im 88/98]
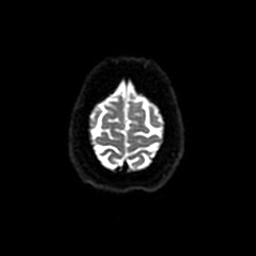
[im 98/98]
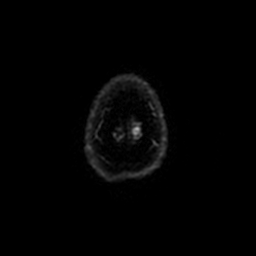

[Series 5: T2 · axial · 5.0mm · 0.86mm/px · z∈[+15,+162]mm · 2 of 22 slices shown (1 of 2)]
[im 1/22]
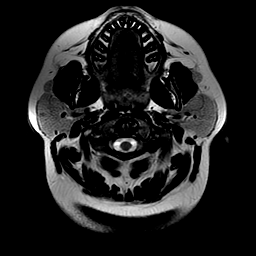
[im 22/22]
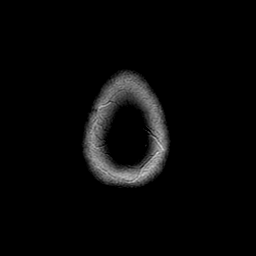

[Series 6: FLAIR · axial · 3.0mm · 0.43mm/px · z∈[+13,+163]mm · 3 of 26 slices shown]
[im 1/26]
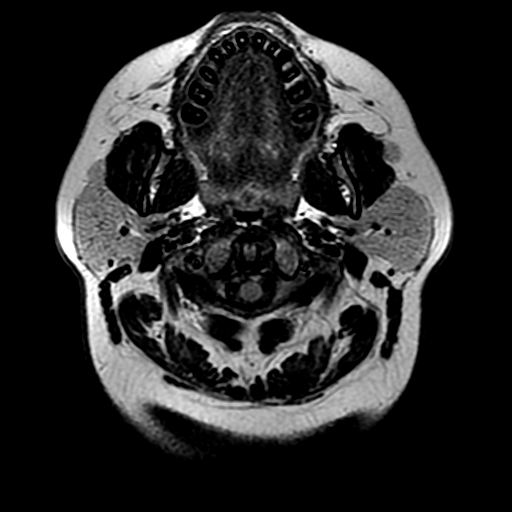
[im 13/26]
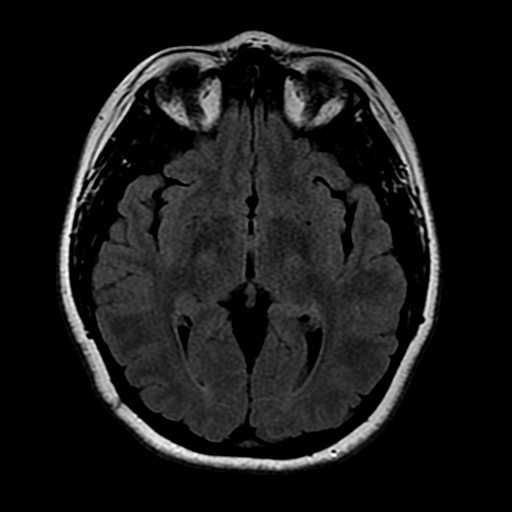
[im 26/26]
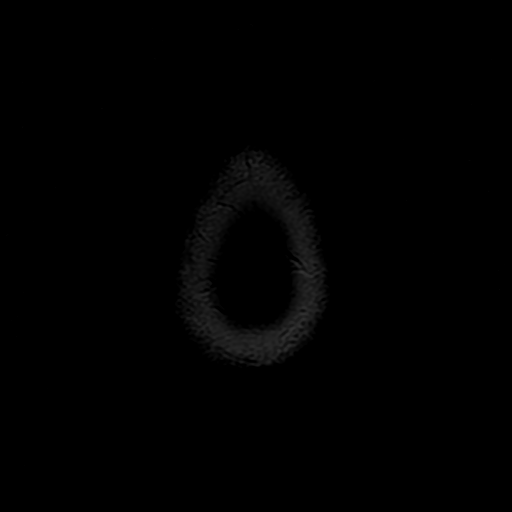

[Series 7: ax mpgr · axial · 5.0mm · 0.43mm/px · z∈[+15,+162]mm · 2 of 22 slices shown]
[im 1/22]
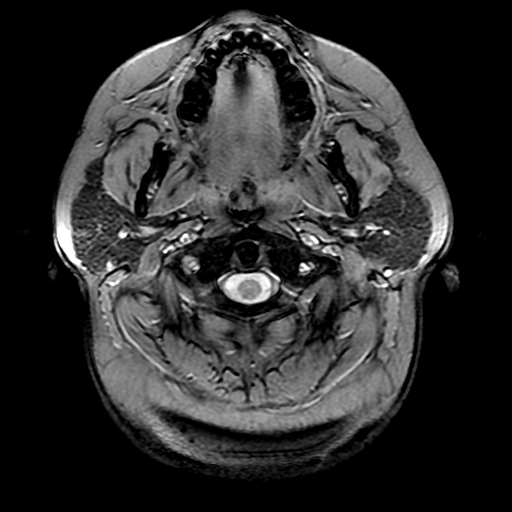
[im 22/22]
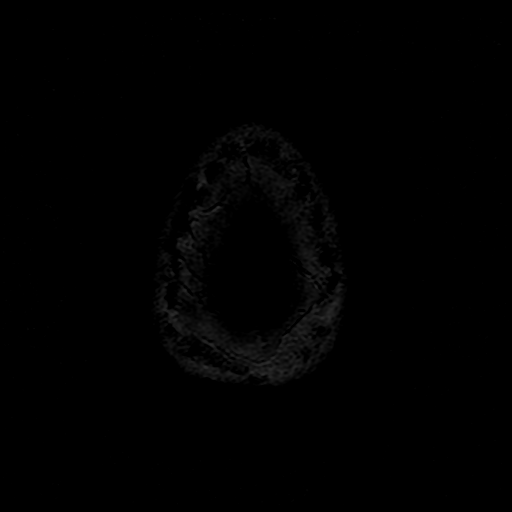

[Series 8: DWI · coronal · 4.0mm · 1.09mm/px · 9 of 84 slices shown (2 of 4)]
[im 1/84]
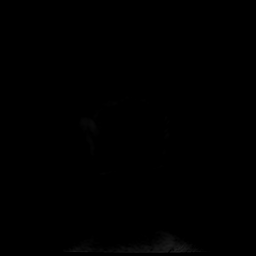
[im 11/84]
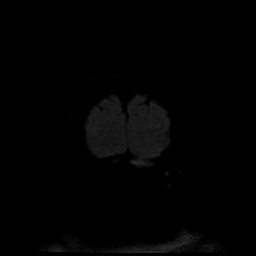
[im 21/84]
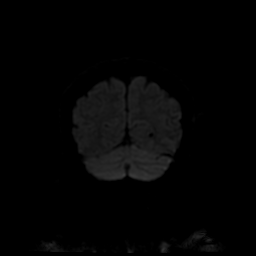
[im 32/84]
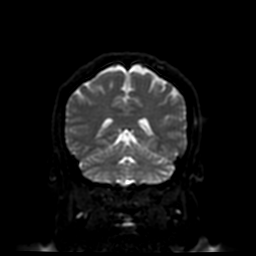
[im 42/84]
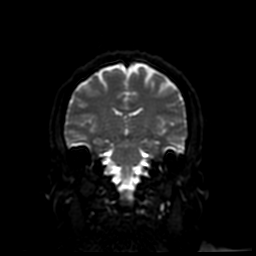
[im 52/84]
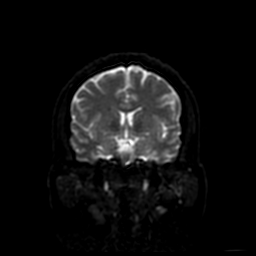
[im 63/84]
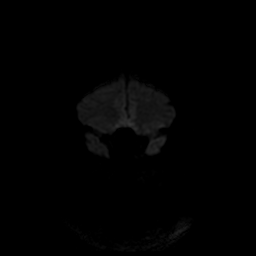
[im 73/84]
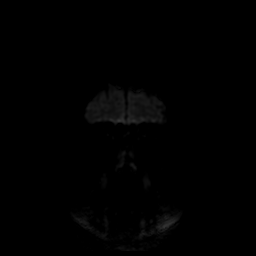
[im 84/84]
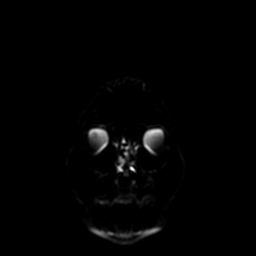

[Series 9: ax fspgr irp · axial · 3.0mm · 0.47mm/px · z∈[+12,+42]mm · 2 of 52 slices shown]
[im 1/52]
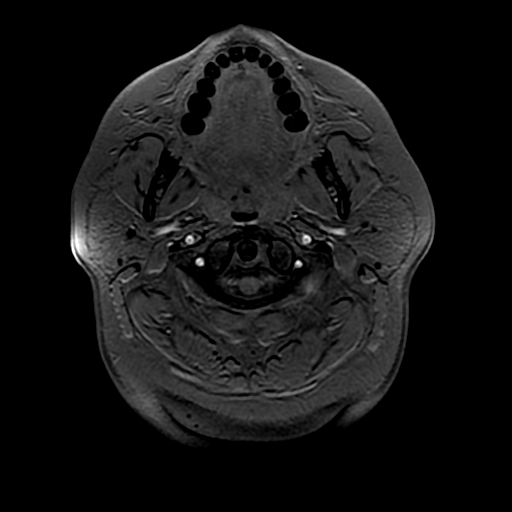
[im 11/52]
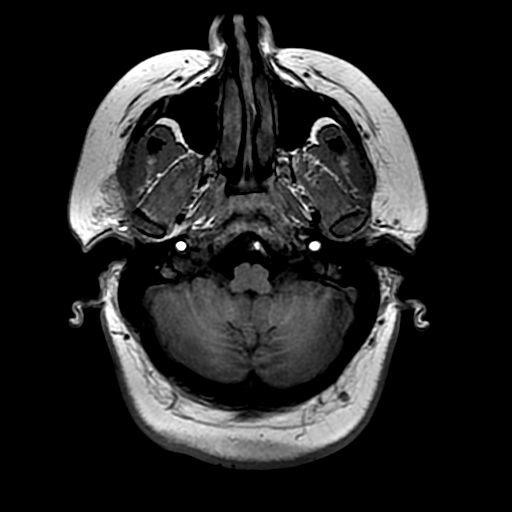

[Series 10: T2 · coronal · 5.0mm · 0.90mm/px · 3 of 27 slices shown (2 of 2)]
[im 1/27]
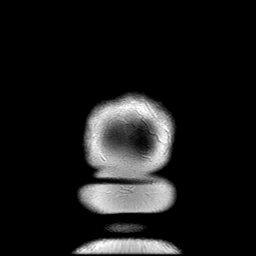
[im 14/27]
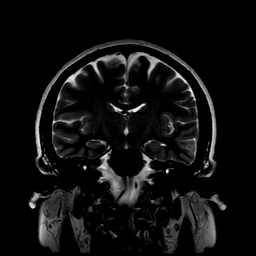
[im 27/27]
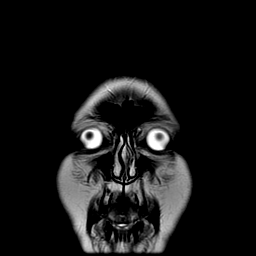

[Series 400: DWI · axial · 3.0mm · 1.09mm/px · z∈[+17,+161]mm · 5 of 49 slices shown (3 of 4)]
[im 1/49]
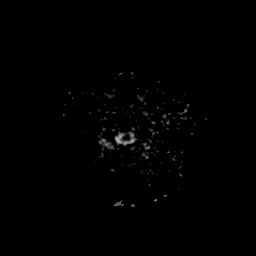
[im 13/49]
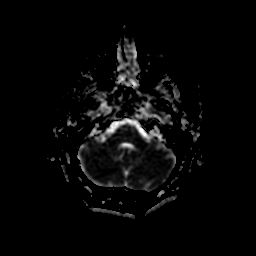
[im 25/49]
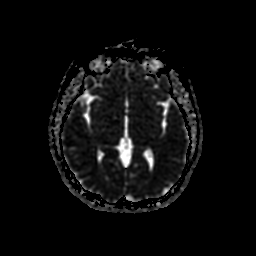
[im 37/49]
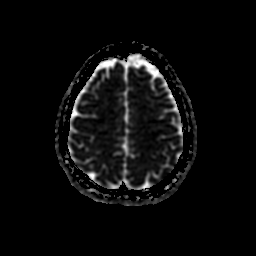
[im 49/49]
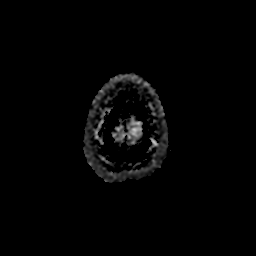

[Series 800: DWI · coronal · 4.0mm · 1.09mm/px · 5 of 42 slices shown (4 of 4)]
[im 1/42]
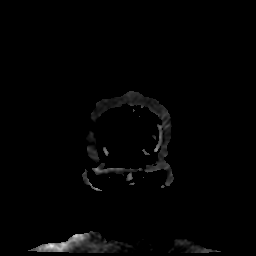
[im 11/42]
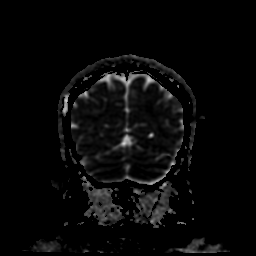
[im 21/42]
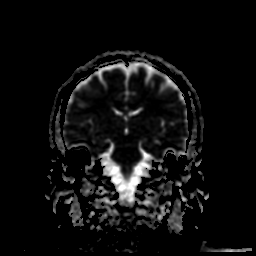
[im 31/42]
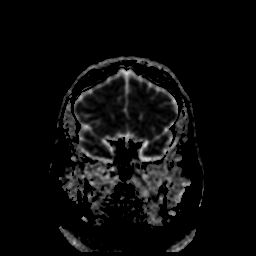
[im 42/42]
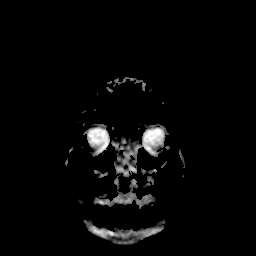

[44 of 48 positions shown; findings below may reference images not displayed]

FINDINGS: Brain: No acute infarction, hemorrhage, hydrocephalus, extra-axial
collection or mass lesion.

Vascular: Normal arterial flow voids

Skull and upper cervical spine: Negative

Sinuses/Orbits: Negative

Other: None
IMPRESSION: Negative MRI head

## 2019-01-30 ENCOUNTER — Other Ambulatory Visit: Payer: Self-pay | Admitting: Neurology

## 2019-05-22 ENCOUNTER — Other Ambulatory Visit: Payer: Self-pay | Admitting: Neurology

## 2019-11-10 ENCOUNTER — Telehealth: Payer: Self-pay | Admitting: Hematology and Oncology

## 2019-11-10 NOTE — Telephone Encounter (Signed)
Received a new hem referral from [name] for abnl cbc. [name] has been cld and scheduled to see [name] on 6/18 at 1pm. Pt aware to arrive 15 minutes early.

## 2019-11-18 NOTE — Progress Notes (Signed)
[place] Telephone:(336) [phone]   Fax:([phone] [place] NOTE  Patient Care Team: Leighton Ruff, MD as PCP - General (Family Medicine) Jolene Provost, PA-C as Physician Assistant (Physician Assistant)  Hematological/Oncological History # Leukocytosis, Neutrophilic Predominance 1) 08/16/1759: WBC 11.1, Hgb 12.5, Plt 376, MCV 86.1 2) 11/26/2017: WBC 11.0, Hgb 13.2, Plt 340, MCV 90.2 3) 11/05/2019: WBC 13.3, Hgb 14.0, MCV 86, Plt 404, ANC 8900 4) 11/19/2019: establish care with Dr. Lorenso Courier   CHIEF COMPLAINTS/PURPOSE OF CONSULTATION:  "CBC abnormality "  HISTORY OF PRESENTING ILLNESS:  [name] 34 y.o. female with medical history significant for HLD, depression, IBS, and Guillain Barre in 2009 who presents for evaluation of longstanding mild leukocytosis.   On review of the previous records Hooser was initially noted to have a leukocytosis on 07/17/2016.  At that time she was found to have a white blood cell count 11.1, hemoglobin 12.5, platelets of 367, and an MCV of 86.1.  On 11/26/2017 the patient was found have white blood cell count 11.0, hemoglobin 13.2, and platelets of 340.  More recently on 11/05/2019 the patient was found to have a white blood cell count of 13.3, hemoglobin of 14.0, platelet count of 404.  These were all of neutrophilic predominance when a differential was ordered.  Due to concern for this longstanding leukocytosis the patient was referred to hematology for further evaluation management.  On exam today [name] notes that the primary symptom she has been experiencing is fatigue and tiredness.  She notes this is been going on for approximately 1 years time.  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  She also reports that she has had thyroid studies done before in the past at [facility] and she even had an ultrasound of her thyroid performed in 2016 which showed a  normal thyroid.  On further discussion she notes that she has been having no issues with fevers, chills, sweats, nausea, vomiting or diarrhea.  She does have some hives on her chest at the moment which she notes "break out when she is nervous".  She notes that she has been having some issues with knee pain bilaterally but that she has had no other issues with her joints.  She also notes no medical history remarkable for inflammatory conditions.  She is currently taking over-the-counter Zyrtec for allergies as well as some vitamin complexes, but no other alternative medications.  On further discussion the patient does have a family history remarkable for rheumatoid arthritis on her father side as well as anemia and breast cancer in her sister and mother.  She notes that she is a non-smoker but does drink 3-4 white claws per week.  Overall she denies having any other focal symptoms that would make a suspicious for infection.  A full 10 point ROS is listed below.  MEDICAL HISTORY:  Past Medical History:  Diagnosis Date  . Depression with anxiety   . Guillain-Barre ([place]) 12/2007  . Hyperlipemia   . Migraine   . Vertigo     SURGICAL HISTORY: Past Surgical History:  Procedure Laterality Date  . ANKLE SURGERY Left     SOCIAL HISTORY: Social History   Socioeconomic History  . Marital status: Single    Spouse name: Not on file  . Number of children: 0  . Years of education: college  . Highest education level: Bachelor's degree (e.g., BA, AB, BS)  Occupational History  .  Occupation: Pharmacist, hospital  Tobacco Use  . Smoking status: Never Smoker  . Smokeless tobacco: Never Used  Vaping Use  . Vaping Use: Never used  Substance and Sexual Activity  . Alcohol use: Not Currently    Comment: 1-2 drinks per month  . Drug use: No  . Sexual activity: Not on file  Other Topics Concern  . Not on file  Social History Narrative   Lives at home alone.   Right-handed.   1 cup coffee per day.   Social  Determinants of Health   Financial Resource Strain:   . Difficulty of Paying Living Expenses:   Food Insecurity:   . Worried About Charity fundraiser in the Last Year:   . Arboriculturist in the Last Year:   Transportation Needs:   . Film/video editor (Medical):   Marland Kitchen Lack of Transportation (Non-Medical):   Physical Activity:   . Days of Exercise per Week:   . Minutes of Exercise per Session:   Stress:   . Feeling of Stress :   Social Connections:   . Frequency of Communication with Friends and Family:   . Frequency of Social Gatherings with Friends and Family:   . Attends Religious Services:   . Active Member of Clubs or Organizations:   . Attends Archivist Meetings:   Marland Kitchen Marital Status:   Intimate Partner Violence:   . Fear of Current or Ex-Partner:   . Emotionally Abused:   Marland Kitchen Physically Abused:   . Sexually Abused:     FAMILY HISTORY: Family History  Problem Relation Age of Onset  . Hyperlipidemia Mother   . Breast cancer Mother   . Hyperlipidemia Father   . Melanoma Father   . Diabetes Maternal Grandmother     ALLERGIES:  is allergic to amoxicillin.  MEDICATIONS:  Current Outpatient Medications  Medication Sig Dispense Refill  . Betahistine HCl (BETAHISTINE DIHYDROCHLORIDE) POWD by Does not apply route.    Marland Kitchen tiZANidine (ZANAFLEX) 4 MG tablet Take 1 tablet (4 mg total) by mouth every 6 (six) hours as needed for muscle spasms. 30 tablet 6  . AIMOVIG 70 MG/ML SOAJ Inject 70 mg into the skin every 28 (twenty-eight) days.    Marland Kitchen atorvastatin (LIPITOR) 20 MG tablet Take 20 mg by mouth daily.    . cetirizine (ZYRTEC) 10 MG tablet Take 10 mg by mouth daily.    . fenofibrate 54 MG tablet Take 108 mg by mouth daily.     Marland Kitchen LORazepam (ATIVAN) 0.5 MG tablet Take 0.5 mg by mouth 2 (two) times daily.   1  . Multiple Vitamin (MULTIVITAMIN WITH MINERALS) TABS tablet Take 1 tablet by mouth daily.    . Probiotic Product (PROBIOTIC PO) Take 1 tablet by mouth daily.       . promethazine (PHENERGAN) 12.5 MG tablet Take 12.5 mg by mouth every 6 (six) hours as needed. for nausea    . topiramate (TOPAMAX) 100 MG tablet TAKE 1 TABLET BY MOUTH TWICE A DAY 180 tablet 0  . zolmitriptan (ZOMIG) 5 MG tablet Take 5 mg by mouth 2 (two) times daily as needed for migraine.     No current facility-administered medications for this visit.    REVIEW OF SYSTEMS:   Constitutional: ( - ) fevers, ( - )  chills , ( - ) night sweats Eyes: ( - ) blurriness of vision, ( - ) double vision, ( - ) watery eyes Ears, nose, mouth, throat, and face: ( - )  mucositis, ( - ) sore throat Respiratory: ( - ) cough, ( - ) dyspnea, ( - ) wheezes Cardiovascular: ( - ) palpitation, ( - ) chest discomfort, ( - ) lower extremity swelling Gastrointestinal:  ( - ) nausea, ( - ) heartburn, ( - ) change in bowel habits Skin: ( - ) abnormal skin rashes Lymphatics: ( - ) new lymphadenopathy, ( - ) easy bruising Neurological: ( - ) numbness, ( - ) tingling, ( - ) new weaknesses Behavioral/Psych: ( - ) mood change, ( - ) new changes  All other systems were reviewed with the patient and are negative.  PHYSICAL EXAMINATION: ECOG PERFORMANCE STATUS: 1 - Symptomatic but completely ambulatory  Vitals:   11/19/19 1319  BP: (!) 143/84  Pulse: 98  Resp: 18  Temp: 98.1 F (36.7 C)  SpO2: 98%   Filed Weights   11/19/19 1319  Weight: 236 lb 3.2 oz (107.1 kg)    GENERAL: well appearing middle aged Caucasian female in NAD  SKIN: skin color, texture, turgor are normal, no rashes or significant lesions EYES: conjunctiva are pink and non-injected, sclera clear LUNGS: clear to auscultation and percussion with normal breathing effort HEART: regular rate & rhythm and no murmurs and no lower extremity edema ABDOMEN: exam limited 2/2 to body habitus.  Musculoskeletal: no cyanosis of digits and no clubbing  PSYCH: alert & oriented x 3, fluent speech NEURO: no focal motor/sensory deficits  LABORATORY DATA:   I have reviewed the data as listed CBC Latest Ref Rng & Units 11/19/2019 11/26/2017 07/17/2016  WBC 4.0 - 10.5 K/uL 10.3 11.0(H) 11.1(H)  Hemoglobin 12.0 - 15.0 g/dL 13.4 13.2 12.5  Hematocrit 36 - 46 % 39.5 41.3 37.3  Platelets 150 - 400 K/uL 320 340 376    CMP Latest Ref Rng & Units 11/19/2019 11/26/2017 07/17/2016  Glucose 70 - 99 mg/dL 99 129(H) 89  BUN 6 - 20 mg/dL _0 Creatinine 0.44 - 1.00 mg/dL 0.82 0.70 0.63  Sodium 135 - 145 mmol/L 141 141 137  Potassium 3.5 - 5.1 mmol/L 3.9 3.6 3.8  Chloride 98 - 111 mmol/L 108 107 102  CO2 22 - 32 mmol/L 20(L) 23 22  Calcium 8.9 - 10.3 mg/dL 9.6 9.3 9.8  Total Protein 6.5 - 8.1 g/dL 8.1 7.7 7.9  Total Bilirubin 0.3 - 1.2 mg/dL 0.4 0.3 0.5  Alkaline Phos 38 - 126 U/L 62 49 61  AST 15 - 41 U/L 29 22 32  ALT 0 - 44 U/L 42 18 23   BLOOD FILM:  Review of the peripheral blood smear showed normal appearing white cells with neutrophils that were appropriately lobated and granulated. There was no predominance of bi-lobed or hyper-segmented neutrophils appreciated. No Dohle bodies were noted. There was no left shifting, immature forms or blasts noted. Lymphocytes remain normal in size without any predominance of large granular lymphocytes. Red cells show no anisopoikilocytosis, macrocytes , microcytes or polychromasia. There were no schistocytes, target cells, echinocytes, acanthocytes, dacrocytes, or stomatocytes.There was no rouleaux formation, nucleated red cells, or intra-cellular inclusions noted. The platelets are normal in size, shape, and color without any clumping evident.  RADIOGRAPHIC STUDIES:  No results found.  ASSESSMENT & PLAN [name] 34 y.o. female with medical history significant for HLD, depression, IBS, and Guillain Barre in 2009 who presents for evaluation of longstanding mild leukocytosis.  After review the labs, review the outside records, discussion with the patient her findings are most consistent with a mild  chronic  leukocytosis.  These appear to be neutrophilic and predominance with a normal differential.  At this time there are few possible different etiologies for these findings.  The differential includes infectious disease, inflammation, bone marrow disorder, or normal variant/obstructive sleep apnea.  The likelihood of an infection is remarkably reduced by the chronicity of the leukocytosis and the lack of symptoms.  The patient does have a strong family history of rheumatoid disease and therefore I would recommend ordering inflammatory markers today.  Primary bone marrow process is somewhat less likely given the normalcy of the other blood counts and the stability over the last 3 years. Review of the peripheral blood film is also reassuring and shows no abnormalities within the white blood cells. At this time I would favor a more benign process causing her leukocytosis.  If our lab work today is unrevealing I would recommend 17-monthfollow-up as well as consideration of a sleep study given her degree of fatigue.  Also as part of work-up for fatigue we will order an iron panel to further assess.  She has had previously normal thyroid studies performed by her PCP.  # Leukocytosis, Neutrophilic Predominance --today will order CBC, CMP, and reticulocyte panel --additionally will collect ESR and CRP. If there are elevated can consider referral for rheumatological evaluation.  --review of the peripheral blood film shows no clear abnormalities in the WBC or other cell lines --additionally can consider ordering a sleep study as this would explain the patient's fatigue and possibly the elevation in WBC --obesity is an independent risk factor for leukocytosis. Additionally the patient may just be a normal variant. --RTC in 6 months or sooner if the patient develops symptoms or needs further workup  #Fatigue, chronic  --patient has been evaluated by her PCP, reported has TSH studies performed. --thyroid [place] 2016  showed no clear abnormalities in the thyroid --will order iron studies today --consideration of sleep study as noted above --continue to monitor   Orders Placed This Encounter  Procedures  . CBC with Differential (Cancer Center Only)    Standing Status:   Future    Number of Occurrences:   1    Standing Expiration Date:   11/18/2020  . CMP ([facility])    Standing Status:   Future    Number of Occurrences:   1    Standing Expiration Date:   11/18/2020  . Sedimentation rate    Standing Status:   Future    Number of Occurrences:   1    Standing Expiration Date:   11/18/2020  . Retic Panel    Standing Status:   Future    Number of Occurrences:   1    Standing Expiration Date:   11/18/2020  . Ferritin    Standing Status:   Future    Number of Occurrences:   1    Standing Expiration Date:   11/18/2020  . Iron and TIBC    Standing Status:   Future    Number of Occurrences:   1    Standing Expiration Date:   11/18/2020  . C-reactive protein    Standing Status:   Future    Number of Occurrences:   1    Standing Expiration Date:   11/18/2020  . Save Smear (SSMR)    Standing Status:   Future    Number of Occurrences:   1    Standing Expiration Date:   11/18/2020  . Lactate dehydrogenase (LDH)  Standing Status:   Future    Number of Occurrences:   1    Standing Expiration Date:   11/18/2020    All questions were answered. The patient knows to call the clinic with any problems, questions or concerns.  A total of more than 60 minutes were spent on this encounter and over half of that time was spent on counseling and coordination of care as outlined above.   Ledell Peoples, MD Department of Hematology/Oncology [place] at [facility] Phone: [phone] Pager: [phone] Email: [email or web address]  11/19/2019 3:08 PM

## 2019-11-19 ENCOUNTER — Encounter: Payer: Self-pay | Admitting: Hematology and Oncology

## 2019-11-19 ENCOUNTER — Other Ambulatory Visit: Payer: Self-pay

## 2019-11-19 ENCOUNTER — Inpatient Hospital Stay: Payer: BC Managed Care – PPO | Attending: Hematology and Oncology | Admitting: Hematology and Oncology

## 2019-11-19 ENCOUNTER — Inpatient Hospital Stay: Payer: BC Managed Care – PPO

## 2019-11-19 VITALS — BP 143/84 | HR 98 | Temp 98.1°F | Resp 18 | Ht 66.0 in | Wt 236.2 lb

## 2019-11-19 DIAGNOSIS — D72825 Bandemia: Secondary | ICD-10-CM

## 2019-11-19 DIAGNOSIS — D72829 Elevated white blood cell count, unspecified: Secondary | ICD-10-CM | POA: Insufficient documentation

## 2019-11-19 DIAGNOSIS — R5382 Chronic fatigue, unspecified: Secondary | ICD-10-CM | POA: Diagnosis not present

## 2019-11-19 LAB — CMP (CANCER CENTER ONLY)
ALT: 42 U/L (ref 0–44)
AST: 29 U/L (ref 15–41)
Albumin: 4.2 g/dL (ref 3.5–5.0)
Alkaline Phosphatase: 62 U/L (ref 38–126)
Anion gap: 13 (ref 5–15)
BUN: 12 mg/dL (ref 6–20)
CO2: 20 mmol/L — ABNORMAL LOW (ref 22–32)
Calcium: 9.6 mg/dL (ref 8.9–10.3)
Chloride: 108 mmol/L (ref 98–111)
Creatinine: 0.82 mg/dL (ref 0.44–1.00)
GFR, Est AFR Am: >60 mL/min (ref >60)
GFR, Estimated: >60 mL/min (ref >60)
Glucose, Bld: 99 mg/dL (ref 70–99)
Potassium: 3.9 mmol/L (ref 3.5–5.1)
Sodium: 141 mmol/L (ref 135–145)
Total Bilirubin: 0.4 mg/dL (ref 0.3–1.2)
Total Protein: 8.1 g/dL (ref 6.5–8.1)

## 2019-11-19 LAB — RETIC PANEL
Immature Retic Fract: 5.8 % (ref 2.3–15.9)
RBC.: 4.53 MIL/uL (ref 3.87–5.11)
Retic Count, Absolute: 69.3 10*3/uL (ref 19.0–186.0)
Retic Ct Pct: 1.5 % (ref 0.4–3.1)
Reticulocyte Hemoglobin: 34.5 pg (ref >27.9)

## 2019-11-19 LAB — CBC WITH DIFFERENTIAL (CANCER CENTER ONLY)
Abs Immature Granulocytes: 0.03 10*3/uL (ref 0.00–0.07)
Basophils Absolute: 0 10*3/uL (ref 0.0–0.1)
Basophils Relative: 0 %
Eosinophils Absolute: 0.2 10*3/uL (ref 0.0–0.5)
Eosinophils Relative: 2 %
HCT: 39.5 % (ref 36.0–46.0)
Hemoglobin: 13.4 g/dL (ref 12.0–15.0)
Immature Granulocytes: 0 %
Lymphocytes Relative: 25 %
Lymphs Abs: 2.5 10*3/uL (ref 0.7–4.0)
MCH: 29.3 pg (ref 26.0–34.0)
MCHC: 33.9 g/dL (ref 30.0–36.0)
MCV: 86.2 fL (ref 80.0–100.0)
Monocytes Absolute: 0.7 10*3/uL (ref 0.1–1.0)
Monocytes Relative: 7 %
Neutro Abs: 6.8 10*3/uL (ref 1.7–7.7)
Neutrophils Relative %: 66 %
Platelet Count: 320 10*3/uL (ref 150–400)
RBC: 4.58 MIL/uL (ref 3.87–5.11)
RDW: 13.8 % (ref 11.5–15.5)
WBC Count: 10.3 10*3/uL (ref 4.0–10.5)
nRBC: 0 % (ref 0.0–0.2)

## 2019-11-19 LAB — LACTATE DEHYDROGENASE: LDH: 167 U/L (ref 98–192)

## 2019-11-19 LAB — SEDIMENTATION RATE: Sed Rate: 20 mm/hr (ref 0–22)

## 2019-11-19 LAB — C-REACTIVE PROTEIN: CRP: 0.6 mg/dL (ref <1.0)

## 2019-11-19 LAB — SAVE SMEAR(SSMR), FOR PROVIDER SLIDE REVIEW

## 2019-11-22 ENCOUNTER — Telehealth: Payer: Self-pay | Admitting: Hematology and Oncology

## 2019-11-22 ENCOUNTER — Telehealth: Payer: Self-pay

## 2019-11-22 LAB — IRON AND TIBC
Iron: 86 ug/dL (ref 41–142)
Saturation Ratios: 20 % — ABNORMAL LOW (ref 21–57)
TIBC: 435 ug/dL (ref 236–444)
UIBC: 349 ug/dL (ref 120–384)

## 2019-11-22 LAB — FERRITIN: Ferritin: 87 ng/mL (ref 11–307)

## 2019-11-22 NOTE — Telephone Encounter (Signed)
-----   Message from Kyra Searles, RN sent at 11/22/2019  3:34 PM EDT -----  ----- Message ----- From: Jaci Standard, MD Sent: 11/22/2019  11:06 AM EDT To: Kyra Searles, RN  Please call Mrs. Storbeck to let her know the results of her bloodowork.  1) the WBC was in the norma range when we saw her. The other blood counts looked good and there were no abnormal findings on review under the microscope. 2) No increased inflammatory markers, so no clear evidence of rheumatological disease 3) No problems with her iron levels. The cause of her fatigue is unclear.  We can order a sleep study for her if she would like, which could explain fatigue and increased WBC. We have a f/u scheduled in 6 months time.   Azucena Freed  ----- Message ----- From: Leory Plowman, Lab In [place] Sent: 11/19/2019   2:49 PM EDT To: Jaci Standard, MD

## 2019-11-22 NOTE — Telephone Encounter (Signed)
Scheduled per los. Called and left msg. Mailed printout  °

## 2019-11-22 NOTE — Telephone Encounter (Signed)
Attempted call to patient no answer and unable to leave voice mail

## 2019-11-24 NOTE — Telephone Encounter (Signed)
Attempted call to patient regarding lab results from 11/19/19. No answer but was able to leave vm message for pt to return this call @ [phone] at her earliest convenience

## 2019-11-26 NOTE — Telephone Encounter (Signed)
Call made to patient this morning regarding lab results. Spoke with patient. Advised that all labs were within normal limits.  Dr. Leonides Schanz did recommend a sleep study if patient was interested. Lavonda stated she would be interested in  This.  Informed Dr. Leonides Schanz of pt's agreement for sleep study.

## 2019-12-14 NOTE — Telephone Encounter (Signed)
na

## 2019-12-26 ENCOUNTER — Other Ambulatory Visit: Payer: Self-pay | Admitting: Hematology and Oncology

## 2019-12-26 DIAGNOSIS — D72825 Bandemia: Secondary | ICD-10-CM

## 2019-12-26 DIAGNOSIS — R5382 Chronic fatigue, unspecified: Secondary | ICD-10-CM

## 2020-02-08 ENCOUNTER — Encounter: Payer: Self-pay | Admitting: Psychiatry

## 2020-02-08 ENCOUNTER — Other Ambulatory Visit: Payer: Self-pay

## 2020-02-08 ENCOUNTER — Ambulatory Visit (INDEPENDENT_AMBULATORY_CARE_PROVIDER_SITE_OTHER): Payer: BC Managed Care – PPO | Admitting: Psychiatry

## 2020-02-08 VITALS — BP 153/81 | HR 95 | Ht 66.0 in | Wt 238.0 lb

## 2020-02-08 DIAGNOSIS — F411 Generalized anxiety disorder: Secondary | ICD-10-CM

## 2020-02-08 DIAGNOSIS — F331 Major depressive disorder, recurrent, moderate: Secondary | ICD-10-CM

## 2020-02-08 MED ORDER — ARIPIPRAZOLE 5 MG PO TABS: ORAL_TABLET | ORAL | 1 refills | Status: DC

## 2020-02-08 MED ORDER — SERTRALINE HCL 100 MG PO TABS: 200.0000 mg | ORAL_TABLET | Freq: Every day | ORAL | 0 refills | Status: DC
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
## 2020-03-02 ENCOUNTER — Other Ambulatory Visit: Payer: Self-pay

## 2020-03-02 ENCOUNTER — Encounter: Payer: Self-pay | Admitting: Psychiatry

## 2020-03-02 ENCOUNTER — Ambulatory Visit (INDEPENDENT_AMBULATORY_CARE_PROVIDER_SITE_OTHER): Payer: BC Managed Care – PPO | Admitting: Psychiatry

## 2020-03-02 VITALS — BP 137/79 | HR 84

## 2020-03-02 DIAGNOSIS — F5081 Binge eating disorder: Secondary | ICD-10-CM

## 2020-03-02 DIAGNOSIS — F900 Attention-deficit hyperactivity disorder, predominantly inattentive type: Secondary | ICD-10-CM | POA: Diagnosis not present

## 2020-03-02 DIAGNOSIS — F3341 Major depressive disorder, recurrent, in partial remission: Secondary | ICD-10-CM | POA: Diagnosis not present

## 2020-03-02 MED ORDER — ARIPIPRAZOLE 5 MG PO TABS: 5.0000 mg | ORAL_TABLET | Freq: Every day | ORAL | 1 refills | Status: DC

## 2020-03-02 MED ORDER — LISDEXAMFETAMINE DIMESYLATE 30 MG PO CAPS: 30.0000 mg | ORAL_CAPSULE | Freq: Every day | ORAL | 0 refills | Status: DC

## 2020-03-02 NOTE — Progress Notes (Signed)
[name] [id number] 1985/11/18 34 y.o.  Subjective:   Patient ID:  [name] is a 34 y.o. (DOB Jan 14, 1986) female.  Chief Complaint:  Chief Complaint  Patient presents with   Follow-up    h/o depression and anxiety   ADD    HPI [name] presents to the office today for follow-up of depression, anxiety, and ADD. She reports, "I've been doing wonderful." Has been getting to work on time and this has been a significant improvement for her. She reports that it is easier to get up and get going. "I'm just happier in general." Reports that she recently got together with a friend and this was positive. She reports that "overall my demeanor is more on the positive side."  She reports that she has been eating more and this has leveled off. She reports that she initially was having to eat a protein bar before the end of the work day. She reports that increased appetite has improved. Her clothes are now fitting normally again. Has had cravings for sweets more. Less binge eating/emotional eating. She reports that her motivation is "still kind of lacking." She reports that she has been reacting to stress by withdrawing and is not doing laundry. She has been engaging in more self-care- had a facial, started skin care routine, and had a massage. Enjoying things a little more now.   She denies any recent panic attacks. She report that she feels that she has handled recent stressor better than she think she would have in the past. Continues to notice some muscle tension and TMJ. She reports that amount of worry is about the same.   She reports that she is constantly thinking about multiple things at once. She has been using her planner more. She reports that she constantly feels as if she is forgetting something. Has to remind herself of things. Continues to notice distractibility.   She reports that she has been sleeping very well. Going to bed earlier and getting up earlier. Going  to bed at a consistent time. Has been having nightmares/vivid dreams and is remembering them more than usual. Denies SI.  Father had to be hospitalized recently and has been dx'd with COPD on top of his dementia. She stayed with him some while he was in the hospital. Reports that her mother is not dealing with father's illness very well.    Has not needed to take Ativan prn recently.   Past Psychiatric Medication Trials: Sertraline- Has taken since 2020. Has helped with depression. Initially seemed to help Lexapro- Took in 2015. Increased up to 20 mg. Took for a couple of years. Reports that it was helpful and may have stopped working. Effexor- Prescribed for Vestibular migraines Wellbutrin-Denies side effects. Started in July. Took Wellbutrin XL 300 mg in the past Ativan- Took as monotherapy in the past and "was a zombie."  Clonidine- Ineffective Propranolol-Ineffective   Review of Systems:  Review of Systems  Musculoskeletal: Negative for gait problem.  Neurological: Negative for tremors.  Psychiatric/Behavioral:       Please refer to HPI    Medications: I have reviewed the patient's current medications.  Current Outpatient Medications  Medication Sig Dispense Refill   AIMOVIG 70 MG/ML SOAJ Inject 70 mg into the skin every 28 (twenty-eight) days.     ARIPiprazole (ABILIFY) 5 MG tablet Take 1 tablet (5 mg total) by mouth daily. 30 tablet 1   atorvastatin (LIPITOR) 20 MG tablet Take 20 mg by mouth daily.  cetirizine (ZYRTEC) 10 MG tablet Take 10 mg by mouth daily.     fenofibrate 54 MG tablet Take 108 mg by mouth daily.      Multiple Vitamin (MULTIVITAMIN WITH MINERALS) TABS tablet Take 1 tablet by mouth daily.     Probiotic Product (PROBIOTIC PO) Take 1 tablet by mouth daily.      promethazine (PHENERGAN) 12.5 MG tablet Take 12.5 mg by mouth every 6 (six) hours as needed. for nausea     psyllium (REGULOID) 0.52 g capsule Take 0.52 g by mouth daily.     sertraline  (ZOLOFT) 100 MG tablet Take 2 tablets (200 mg total) by mouth daily. 90 tablet 0   tiZANidine (ZANAFLEX) 4 MG tablet Take 1 tablet (4 mg total) by mouth every 6 (six) hours as needed for muscle spasms. 30 tablet 6   topiramate (TOPAMAX) 100 MG tablet TAKE 1 TABLET BY MOUTH TWICE A DAY (Patient taking differently: Take 100 mg by mouth daily. ) 180 tablet 0   zolmitriptan (ZOMIG) 5 MG tablet Take 5 mg by mouth 2 (two) times daily as needed for migraine.     Betahistine HCl (BETAHISTINE DIHYDROCHLORIDE) POWD by Does not apply route.     lisdexamfetamine (VYVANSE) 30 MG capsule Take 1 capsule (30 mg total) by mouth daily. 30 capsule 0   LORazepam (ATIVAN) 0.5 MG tablet Take 0.5 mg by mouth 2 (two) times daily.   1   No current facility-administered medications for this visit.    Medication Side Effects: Other: Initial increased appetite. Denies TD  Allergies:  Allergies  Allergen Reactions   Amoxicillin Other (See Comments)    Numbness and tingling at extremities.  Has patient had a PCN reaction causing immediate rash, facial/tongue/throat swelling, SOB or lightheadedness with hypotension: Yes (rash) Has patient had a PCN reaction causing severe rash involving mucus membranes or skin necrosis: No Has patient had a PCN reaction that required hospitalization: No Has patient had a PCN reaction occurring within the last 10 years: Yes If all of the above answers are "NO", then may proceed with Cephalosporin use.     Past Medical History:  Diagnosis Date   Depression with anxiety    Guillain-Barre (HCC) 12/2007   Hyperlipemia    Migraine    Vertigo     Family History  Problem Relation Age of Onset   Hyperlipidemia Mother    Breast cancer Mother    Hyperlipidemia Father    Melanoma Father    Depression Father    Anxiety disorder Father    Dementia Father    Diabetes Maternal Grandmother    Bipolar disorder Niece    ADD / ADHD Niece    Anxiety disorder Sister     Depression Sister    Anxiety disorder Half-Sister    Depression Half-Sister    ADD / ADHD Half-Sister     Social History   Socioeconomic History   Marital status: Single    Spouse name: Not on file   Number of children: 0   Years of education: college   Highest education level: Bachelor's degree (e.g., BA, AB, BS)  Occupational History   Occupation: Teacher  Tobacco Use   Smoking status: Never Smoker   Smokeless tobacco: Never Used  Vaping Use   Vaping Use: Never used  Substance and Sexual Activity   Alcohol use: Yes    Comment: 1-2 drinks per month   Drug use: No   Sexual activity: Not on file  Other Topics Concern  Not on file  Social History Narrative   Lives at home alone.   Right-handed.   1 cup coffee per day.   Social Determinants of Health   Financial Resource Strain:    Difficulty of Paying Living Expenses: Not on file  Food Insecurity:    Worried About Programme researcher, broadcasting/film/video in the Last Year: Not on file   [employer] of Food in the Last Year: Not on file  Transportation Needs:    Lack of Transportation (Medical): Not on file   Lack of Transportation (Non-Medical): Not on file  Physical Activity:    Days of Exercise per Week: Not on file   Minutes of Exercise per Session: Not on file  Stress:    Feeling of Stress : Not on file  Social Connections:    Frequency of Communication with Friends and Family: Not on file   Frequency of Social Gatherings with Friends and Family: Not on file   Attends Religious Services: Not on file   Active Member of Clubs or Organizations: Not on file   Attends Banker Meetings: Not on file   Marital Status: Not on file  Intimate Partner Violence:    Fear of Current or Ex-Partner: Not on file   Emotionally Abused: Not on file   Physically Abused: Not on file   Sexually Abused: Not on file    Past Medical History, Surgical history, Social history, and Family history were  reviewed and updated as appropriate.   Please see review of systems for further details on the patient's review from today.   Objective:   Physical Exam:  BP 137/79    Pulse 84   Physical Exam Constitutional:      General: She is not in acute distress. Musculoskeletal:        General: No deformity.  Neurological:     Mental Status: She is alert and oriented to person, place, and time.     Coordination: Coordination normal.  Psychiatric:        Attention and Perception: Attention and perception normal. She does not perceive auditory or visual hallucinations.        Mood and Affect: Mood is not anxious. Affect is not labile, blunt, angry or inappropriate.        Speech: Speech normal.        Behavior: Behavior normal.        Thought Content: Thought content normal. Thought content is not paranoid or delusional. Thought content does not include homicidal or suicidal ideation. Thought content does not include homicidal or suicidal plan.        Cognition and Memory: Cognition and memory normal.        Judgment: Judgment normal.     Comments: Insight intact Mood presents as less depressed     Lab Review:     Component Value Date/Time   NA 141 11/19/2019 1422   K 3.9 11/19/2019 1422   CL 108 11/19/2019 1422   CO2 20 (L) 11/19/2019 1422   GLUCOSE 99 11/19/2019 1422   BUN 12 11/19/2019 1422   CREATININE 0.82 11/19/2019 1422   CALCIUM 9.6 11/19/2019 1422   PROT 8.1 11/19/2019 1422   ALBUMIN 4.2 11/19/2019 1422   AST 29 11/19/2019 1422   ALT 42 11/19/2019 1422   ALKPHOS 62 11/19/2019 1422   BILITOT 0.4 11/19/2019 1422   GFRNONAA >60 11/19/2019 1422   GFRAA >60 11/19/2019 1422       Component Value Date/Time  WBC 10.3 11/19/2019 1422   WBC 11.0 (H) 11/26/2017 1730   RBC 4.53 11/19/2019 1423   RBC 4.58 11/19/2019 1422   HGB 13.4 11/19/2019 1422   HCT 39.5 11/19/2019 1422   PLT 320 11/19/2019 1422   MCV 86.2 11/19/2019 1422   MCH 29.3 11/19/2019 1422   MCHC 33.9  11/19/2019 1422   RDW 13.8 11/19/2019 1422   LYMPHSABS 2.5 11/19/2019 1422   MONOABS 0.7 11/19/2019 1422   EOSABS 0.2 11/19/2019 1422   BASOSABS 0.0 11/19/2019 1422    No results found for: POCLITH, LITHIUM   No results found for: PHENYTOIN, PHENOBARB, VALPROATE, CBMZ   .res Assessment: Plan:   Discussed potential benefits, risks, and side effects of stimulants with patient to include increased heart rate, palpitations, insomnia, increased anxiety, increased irritability, or decreased appetite.  Instructed patient to contact office if experiencing any significant tolerability issues. Pt agrees to trial of Vyvanse. Will start Vyvanse 30 mg po q am for ADD.  Will continue Abilify 5 mg daily for depression.  Continue Sertraline 200 mg po qd for depression and anxiety.  Pt to follow-up in 4 weeks or sooner if clinically indicated.  Patient advised to contact office with any questions, adverse effects, or acute worsening in signs and symptoms.  [name] was seen today for follow-up and add.  Diagnoses and all orders for this visit:  Attention deficit hyperactivity disorder (ADHD), predominantly inattentive type -     lisdexamfetamine (VYVANSE) 30 MG capsule; Take 1 capsule (30 mg total) by mouth daily.  Binge eating disorder -     lisdexamfetamine (VYVANSE) 30 MG capsule; Take 1 capsule (30 mg total) by mouth daily.  Recurrent major depressive disorder, in partial remission (HCC) -     ARIPiprazole (ABILIFY) 5 MG tablet; Take 1 tablet (5 mg total) by mouth daily.     Please see After Visit Summary for patient specific instructions.  Future Appointments  Date Time Provider Department Center  04/06/2020  4:00 PM Corie Chiquito, PMHNP [facility] None  05/19/2020  8:00 AM [facility] None  05/19/2020  8:30 AM Jaci Standard, MD [facility] None    No orders of the defined types were placed in this encounter.   -------------------------------

## 2020-03-03 ENCOUNTER — Telehealth: Payer: Self-pay | Admitting: Psychiatry

## 2020-03-03 NOTE — Telephone Encounter (Signed)
Pt LM that her rx for Vyvanse was denied at pharmacy. Please call.

## 2020-03-03 NOTE — Telephone Encounter (Signed)
Her medication probably needs a prior authorization, with it being weekend she may not be able to get until Monday.

## 2020-03-06 NOTE — Telephone Encounter (Signed)
Prior authorization submitted and approved for VYVANSE 30 MG effective 03/05/2020-03/06/2023 with [facility]

## 2020-03-24 ENCOUNTER — Other Ambulatory Visit: Payer: Self-pay | Admitting: Psychiatry

## 2020-03-24 DIAGNOSIS — F3341 Major depressive disorder, recurrent, in partial remission: Secondary | ICD-10-CM

## 2020-04-06 ENCOUNTER — Ambulatory Visit (INDEPENDENT_AMBULATORY_CARE_PROVIDER_SITE_OTHER): Payer: BC Managed Care – PPO | Admitting: Psychiatry

## 2020-04-06 ENCOUNTER — Encounter: Payer: Self-pay | Admitting: Psychiatry

## 2020-04-06 ENCOUNTER — Other Ambulatory Visit: Payer: Self-pay

## 2020-04-06 DIAGNOSIS — F411 Generalized anxiety disorder: Secondary | ICD-10-CM

## 2020-04-06 DIAGNOSIS — F900 Attention-deficit hyperactivity disorder, predominantly inattentive type: Secondary | ICD-10-CM | POA: Diagnosis not present

## 2020-04-06 DIAGNOSIS — F5081 Binge eating disorder: Secondary | ICD-10-CM

## 2020-04-06 DIAGNOSIS — F331 Major depressive disorder, recurrent, moderate: Secondary | ICD-10-CM

## 2020-04-06 DIAGNOSIS — F3341 Major depressive disorder, recurrent, in partial remission: Secondary | ICD-10-CM

## 2020-04-06 MED ORDER — LISDEXAMFETAMINE DIMESYLATE 30 MG PO CAPS: 30.0000 mg | ORAL_CAPSULE | Freq: Every day | ORAL | 0 refills | Status: DC

## 2020-04-06 MED ORDER — SERTRALINE HCL 100 MG PO TABS: 200.0000 mg | ORAL_TABLET | Freq: Every day | ORAL | 0 refills | Status: DC

## 2020-04-06 MED ORDER — ARIPIPRAZOLE 5 MG PO TABS: 5.0000 mg | ORAL_TABLET | Freq: Every day | ORAL | 0 refills | Status: DC

## 2020-04-06 NOTE — Progress Notes (Signed)
Pamela Mascotmanda D Zartman [id number] 04-13-1986 34 y.o.  Subjective:   Patient ID:  [name] is a [age] y.o. (DOB 04-13-1986) female.  Chief Complaint:  Chief Complaint  Patient presents with   Follow-up    Depression, anxiety, and ADD    HPI Pamela Mascotmanda D Creveling presents to the office today for follow-up of anxiety, depression, and ADD. She reports that she has done well with Vyvanse and has been more productive. Has been less distracted. 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 She reports that this is a tedious process and something she normally would have had difficulty with and would have procrastinated. She reports that her sleep has improved and was not as tardy at work. She reports that her anxiety has improved. She reports she occasionally notices some thoughts, such as "what haven't I done?" She has been better able to keep track of the date, her schedule, and appointments. She continues to have some routines and rituals. She continues to notice some anxiety. She reports that she had one mild panic attack/ episode of increased anxiety when there were multiple stressors to include a fight between students and getting called to the principal's office to talk with a parent. Denies depressed mood. She reports that she has been sleeping well and waking up before her alarm. Has been going out more and interacting socially more. Energy and motivation have improved. She reports that she continues to be tired after work. She reports that she notices some weight loss. Appetite has been slightly less. She reports that she is not over-eating to the point of feeling sick or uncomfortable. She reports that she feels full and comfortable throughout the day except prior to meals. Eating 3 meals a day. Reports stress eating has been less. Denies SI.   Took Ativan 0.5 mg prn x 1 since last visit.  "This is the happiest I have been in a long  time."  Father is doing well and no longer requiring supplemental oxygen.    Past Psychiatric Medication Trials: Sertraline- Has taken since 2020. Has helped with depression. Initially seemed to help Lexapro- Took in 2015. Increased up to 20 mg. Took for a couple of years. Reports that it was helpful and may have stopped working. Effexor- Prescribed for Vestibular migraines Wellbutrin-Denies side effects. Started in July. Took Wellbutrin XL 300 mg in the past Abilify Ativan- Took as monotherapy in the past and "was a zombie."  Clonidine- Ineffective Propranolol-Ineffective  Review of Systems:  Review of Systems  Cardiovascular: Negative for palpitations.  Musculoskeletal: Negative for gait problem.  Neurological: Negative for tremors.  Psychiatric/Behavioral:       Please refer to HPI    Medications: I have reviewed the patient's current medications.  Current Outpatient Medications  Medication Sig Dispense Refill   AIMOVIG 70 MG/ML SOAJ Inject 70 mg into the skin every 28 (twenty-eight) days.     ARIPiprazole (ABILIFY) 5 MG tablet Take 1 tablet (5 mg total) by mouth daily. 90 tablet 0   atorvastatin (LIPITOR) 20 MG tablet Take 20 mg by mouth daily.     Betahistine HCl (BETAHISTINE DIHYDROCHLORIDE) POWD by Does not apply route.     cetirizine (ZYRTEC) 10 MG tablet Take 10 mg by mouth daily.     fenofibrate 54 MG tablet Take 108 mg by mouth daily.      lisdexamfetamine (VYVANSE) 30 MG capsule Take 1 capsule (30 mg total) by  mouth daily. 30 capsule 0   LORazepam (ATIVAN) 0.5 MG tablet Take 0.5 mg by mouth 2 (two) times daily as needed.   1   Multiple Vitamin (MULTIVITAMIN WITH MINERALS) TABS tablet Take 1 tablet by mouth daily.     Probiotic Product (PROBIOTIC PO) Take 1 tablet by mouth daily.      promethazine (PHENERGAN) 12.5 MG tablet Take 12.5 mg by mouth every 6 (six) hours as needed. for nausea     psyllium (REGULOID) 0.52 g capsule Take 0.52 g by mouth daily.      sertraline (ZOLOFT) 100 MG tablet Take 2 tablets (200 mg total) by mouth daily. 90 tablet 0   tiZANidine (ZANAFLEX) 4 MG tablet Take 1 tablet (4 mg total) by mouth every 6 (six) hours as needed for muscle spasms. 30 tablet 6   topiramate (TOPAMAX) 100 MG tablet TAKE 1 TABLET BY MOUTH TWICE A DAY (Patient taking differently: Take 100 mg by mouth daily. ) 180 tablet 0   zolmitriptan (ZOMIG) 5 MG tablet Take 5 mg by mouth 2 (two) times daily as needed for migraine.     [START ON 05/04/2020] lisdexamfetamine (VYVANSE) 30 MG capsule Take 1 capsule (30 mg total) by mouth daily. 30 capsule 0   [START ON 06/29/2020] lisdexamfetamine (VYVANSE) 30 MG capsule Take 1 capsule (30 mg total) by mouth daily. 30 capsule 0   No current facility-administered medications for this visit.    Medication Side Effects: None  Allergies:  Allergies  Allergen Reactions   Amoxicillin Other (See Comments)    Numbness and tingling at extremities.  Has patient had a PCN reaction causing immediate rash, facial/tongue/throat swelling, SOB or lightheadedness with hypotension: Yes (rash) Has patient had a PCN reaction causing severe rash involving mucus membranes or skin necrosis: No Has patient had a PCN reaction that required hospitalization: No Has patient had a PCN reaction occurring within the last 10 years: Yes If all of the above answers are "NO", then may proceed with Cephalosporin use.     Past Medical History:  Diagnosis Date   Depression with anxiety    Guillain-Barre (HCC) 12/2007   Hyperlipemia    Migraine    Vertigo     Family History  Problem Relation Age of Onset   Hyperlipidemia Mother    Breast cancer Mother    Hyperlipidemia Father    Melanoma Father    Depression Father    Anxiety disorder Father    Dementia Father    Diabetes Maternal Grandmother    Bipolar disorder Niece    ADD / ADHD Niece    Anxiety disorder Sister    Depression Sister    Anxiety disorder  Half-Sister    Depression Half-Sister    ADD / ADHD Half-Sister     Social History   Socioeconomic History   Marital status: Single    Spouse name: Not on file   Number of children: 0   Years of education: college   Highest education level: Bachelor's degree (e.g., BA, AB, BS)  Occupational History   Occupation: Teacher  Tobacco Use   Smoking status: Never Smoker   Smokeless tobacco: Never Used  Vaping Use   Vaping Use: Never used  Substance and Sexual Activity   Alcohol use: Yes    Comment: 1-2 drinks per month   Drug use: No   Sexual activity: Not on file  Other Topics Concern   Not on file  Social History Narrative   Lives at home alone.  Right-handed.   1 cup coffee per day.   Social Determinants of Health   Financial Resource Strain:    Difficulty of Paying Living Expenses: Not on file  Food Insecurity:    Worried About Programme researcher, broadcasting/film/video in the Last Year: Not on file   [employer] of Food in the Last Year: Not on file  Transportation Needs:    Lack of Transportation (Medical): Not on file   Lack of Transportation (Non-Medical): Not on file  Physical Activity:    Days of Exercise per Week: Not on file   Minutes of Exercise per Session: Not on file  Stress:    Feeling of Stress : Not on file  Social Connections:    Frequency of Communication with Friends and Family: Not on file   Frequency of Social Gatherings with Friends and Family: Not on file   Attends Religious Services: Not on file   Active Member of Clubs or Organizations: Not on file   Attends Banker Meetings: Not on file   Marital Status: Not on file  Intimate Partner Violence:    Fear of Current or Ex-Partner: Not on file   Emotionally Abused: Not on file   Physically Abused: Not on file   Sexually Abused: Not on file    Past Medical History, Surgical history, Social history, and Family history were reviewed and updated as appropriate.   Please  see review of systems for further details on the patient's review from today.   Objective:   Physical Exam:  BP (!) 142/81    Pulse 77   Physical Exam Constitutional:      General: She is not in acute distress. Musculoskeletal:        General: No deformity.  Neurological:     Mental Status: She is alert and oriented to person, place, and time.     Coordination: Coordination normal.  Psychiatric:        Attention and Perception: Attention and perception normal. She does not perceive auditory or visual hallucinations.        Mood and Affect: Mood normal. Mood is not anxious or depressed. Affect is not labile, blunt, angry or inappropriate.        Speech: Speech normal.        Behavior: Behavior normal.        Thought Content: Thought content normal. Thought content is not paranoid or delusional. Thought content does not include homicidal or suicidal ideation. Thought content does not include homicidal or suicidal plan.        Cognition and Memory: Cognition and memory normal.        Judgment: Judgment normal.     Comments: Insight intact     Lab Review:     Component Value Date/Time   NA 141 11/19/2019 1422   K 3.9 11/19/2019 1422   CL 108 11/19/2019 1422   CO2 20 (L) 11/19/2019 1422   GLUCOSE 99 11/19/2019 1422   BUN 12 11/19/2019 1422   CREATININE 0.82 11/19/2019 1422   CALCIUM 9.6 11/19/2019 1422   PROT 8.1 11/19/2019 1422   ALBUMIN 4.2 11/19/2019 1422   AST 29 11/19/2019 1422   ALT 42 11/19/2019 1422   ALKPHOS 62 11/19/2019 1422   BILITOT 0.4 11/19/2019 1422   GFRNONAA >60 11/19/2019 1422   GFRAA >60 11/19/2019 1422       Component Value Date/Time   WBC 10.3 11/19/2019 1422   WBC 11.0 (H) 11/26/2017 1730   RBC 4.53  11/19/2019 1423   RBC 4.58 11/19/2019 1422   HGB 13.4 11/19/2019 1422   HCT 39.5 11/19/2019 1422   PLT 320 11/19/2019 1422   MCV 86.2 11/19/2019 1422   MCH 29.3 11/19/2019 1422   MCHC 33.9 11/19/2019 1422   RDW 13.8 11/19/2019 1422   LYMPHSABS  2.5 11/19/2019 1422   MONOABS 0.7 11/19/2019 1422   EOSABS 0.2 11/19/2019 1422   BASOSABS 0.0 11/19/2019 1422    No results found for: POCLITH, LITHIUM   No results found for: PHENYTOIN, PHENOBARB, VALPROATE, CBMZ   .res Assessment: Plan:   Will continue current plan of care since target signs and symptoms are well controlled without any tolerability issues. Continue Abilify 5 mg po qd for depression. Discussed monitoring for metabolic side effects. Pt has annual physical exam scheduled to include lab monitoring.  Continue Sertraline 200 mg po qd for depression and anxiety.  Continue Vyvanse 30 mg po q am for ADD and binge eating disorder.  Pt to follow-up in 3 months or sooner if clinically indicated.  Patient advised to contact office with any questions, adverse effects, or acute worsening in signs and symptoms.  Adiva was seen today for follow-up.  Diagnoses and all orders for this visit:  Recurrent major depressive disorder, in partial remission (HCC) -     ARIPiprazole (ABILIFY) 5 MG tablet; Take 1 tablet (5 mg total) by mouth daily. -     sertraline (ZOLOFT) 100 MG tablet; Take 2 tablets (200 mg total) by mouth daily.  Binge eating disorder -     lisdexamfetamine (VYVANSE) 30 MG capsule; Take 1 capsule (30 mg total) by mouth daily.  Attention deficit hyperactivity disorder (ADHD), predominantly inattentive type -     lisdexamfetamine (VYVANSE) 30 MG capsule; Take 1 capsule (30 mg total) by mouth daily. -     lisdexamfetamine (VYVANSE) 30 MG capsule; Take 1 capsule (30 mg total) by mouth daily. -     lisdexamfetamine (VYVANSE) 30 MG capsule; Take 1 capsule (30 mg total) by mouth daily.  Generalized anxiety disorder -     sertraline (ZOLOFT) 100 MG tablet; Take 2 tablets (200 mg total) by mouth daily.     Please see After Visit Summary for patient specific instructions.  Future Appointments  Date Time Provider Department Center  05/19/2020  8:00 AM [facility]  [facility] None  05/19/2020  8:30 AM Jaci Standard, MD [facility] None  07/04/2020  4:00 PM Corie Chiquito, PMHNP [facility] None    No orders of the defined types were placed in this encounter.   -------------------------------

## 2020-05-05 ENCOUNTER — Other Ambulatory Visit: Payer: Self-pay | Admitting: Psychiatry

## 2020-05-05 DIAGNOSIS — F411 Generalized anxiety disorder: Secondary | ICD-10-CM

## 2020-05-05 DIAGNOSIS — F3341 Major depressive disorder, recurrent, in partial remission: Secondary | ICD-10-CM

## 2020-05-19 ENCOUNTER — Inpatient Hospital Stay: Payer: BC Managed Care – PPO | Admitting: Hematology and Oncology

## 2020-05-19 ENCOUNTER — Inpatient Hospital Stay: Payer: BC Managed Care – PPO

## 2020-06-13 ENCOUNTER — Telehealth: Payer: Self-pay | Admitting: Psychiatry

## 2020-06-13 DIAGNOSIS — F900 Attention-deficit hyperactivity disorder, predominantly inattentive type: Secondary | ICD-10-CM

## 2020-06-13 MED ORDER — LISDEXAMFETAMINE DIMESYLATE 30 MG PO CAPS: 30.0000 mg | ORAL_CAPSULE | Freq: Every day | ORAL | 0 refills | Status: DC

## 2020-06-13 NOTE — Telephone Encounter (Signed)
[name] called trying to fill her Vyvanse RX but it is not able to be filled until Jan 27? Is that date correct. Last fill date in Dec 2nd and she is out as of Jan 7. Can this be corrected?

## 2020-07-04 ENCOUNTER — Ambulatory Visit: Payer: BC Managed Care – PPO | Admitting: Psychiatry

## 2020-07-10 ENCOUNTER — Other Ambulatory Visit: Payer: Self-pay

## 2020-07-10 ENCOUNTER — Encounter: Payer: Self-pay | Admitting: Psychiatry

## 2020-07-10 ENCOUNTER — Ambulatory Visit (INDEPENDENT_AMBULATORY_CARE_PROVIDER_SITE_OTHER): Payer: BC Managed Care – PPO | Admitting: Psychiatry

## 2020-07-10 DIAGNOSIS — F3341 Major depressive disorder, recurrent, in partial remission: Secondary | ICD-10-CM

## 2020-07-10 DIAGNOSIS — F5081 Binge eating disorder: Secondary | ICD-10-CM

## 2020-07-10 DIAGNOSIS — F900 Attention-deficit hyperactivity disorder, predominantly inattentive type: Secondary | ICD-10-CM | POA: Diagnosis not present

## 2020-07-10 DIAGNOSIS — F411 Generalized anxiety disorder: Secondary | ICD-10-CM | POA: Diagnosis not present

## 2020-07-10 MED ORDER — BUPROPION HCL ER (XL) 150 MG PO TB24: 150.0000 mg | ORAL_TABLET | Freq: Every day | ORAL | 1 refills | Status: DC

## 2020-07-10 MED ORDER — SERTRALINE HCL 100 MG PO TABS: 200.0000 mg | ORAL_TABLET | Freq: Every day | ORAL | 0 refills | Status: DC

## 2020-07-10 MED ORDER — ARIPIPRAZOLE 5 MG PO TABS: 5.0000 mg | ORAL_TABLET | Freq: Every day | ORAL | 0 refills | Status: DC

## 2020-07-10 MED ORDER — LISDEXAMFETAMINE DIMESYLATE 30 MG PO CAPS: 30.0000 mg | ORAL_CAPSULE | Freq: Every day | ORAL | 0 refills | Status: DC

## 2020-07-10 NOTE — Progress Notes (Signed)
   07/10/20 1231  Facial and Oral Movements  Muscles of Facial Expression 0  Lips and Perioral Area 0  Jaw 0  Tongue 0  Extremity Movements  Upper (arms, wrists, hands, fingers) 0  Lower (legs, knees, ankles, toes) 0  Trunk Movements  Neck, shoulders, hips 0  Overall Severity  Severity of abnormal movements (highest score from questions above) 0  Incapacitation due to abnormal movements 0  Patient's awareness of abnormal movements (rate only patient's report) 0  AIMS Total Score  AIMS Total Score 0

## 2020-07-10 NOTE — Progress Notes (Signed)
[name] [id number] 10-20-1985 35 y.o.  Subjective:   Patient ID:  [name] is a 35 y.o. (DOB 1985-11-14) female.  Chief Complaint:  Chief Complaint  Patient presents with  . Depression  . Anxiety  . ADHD    HPI [name] presents to the office today for follow-up of depression, anxiety, and ADHD. She reports that she has had a "rough couple months. Low energy" and difficulty motivating herself to do tasks. She reports that she has been going to bed 6 pm-7 pm and sleeping until 5:45 am without awakening. Reports that she is putting all of her energy towards getting through her work day and then feels drained at the end of the day.Has not been keeping up with house work and has been using paper products to eliminate need to wash dishes. Has been staying in bed on the weekends and feels that she has to recover from the week. 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 She reports, "I'm feeling sad. My heart is heavy."  She reports that her anxiety is elevated "but right now the fatigue and depression" are her chief complaints. She reports that Vyvanse has been helpful at work for concentration and does not get as "flustered." Takes Vyvanse at 6 am and it seems to wear off around 3 pm and will be more easily distracted. Will take Vyvanse on weekends and reports that it helps if she is up and active, otherwise she will go back to bed. She reports that she may be using sleep as a coping mechanism and some avoidance. She reports that she has not been doing much recently that she enjoys, such as shopping. Has withdrawn some socially. Appetite is low when mood is lower. She reports that her weight has fluctuated. Denies over-eating. Denies SI.    Reports that depressive s/s have been increasing and came to a head in the last couple of months. Reports that there may be a seasonal component to depression.   She  has been crying frequently.  AIMS   Flowsheet Row Office Visit from 07/10/2020 in [facility]  AIMS Total Score 0      Has talked to parents about possibly moving in with them. Reports family has been supportive. Enjoyed trip to [place]. Has not yet unpacked.   Has been working with therapist.   Has not needed Ativan prn recently. Has not had any recent panic attacks.  She reports that "the best I ever felt" was when she was in the process of cross-titration off Wellbutrin XL 150 mg onto Zoloft.   Past Psychiatric Medication Trials: Sertraline- Has taken since 2020. Has helped with depression. Initially seemed to help Lexapro- Took in 2015. Increased up to 20 mg. Took for a couple of years. Reports that it was helpful and may have stopped working. Effexor- Prescribed for Vestibular migraines Wellbutrin-Denies side effects. Started in July. Took Wellbutrin XL 300 mg in the past Abilify- Seemed to be effective initially. Ativan- Took as monotherapy in the past and "was a zombie."  Clonidine- Ineffective Propranolol-Ineffective  Review of Systems:  Review of Systems  Gastrointestinal: Negative.   Musculoskeletal: Negative for gait problem.  Neurological: Positive for headaches. Negative for tremors.  Psychiatric/Behavioral:       Please refer to HPI    Found to have low Vitamin D. Has apt to   Medications: I have reviewed the patient's current  medications.  Current Outpatient Medications  Medication Sig Dispense Refill  . atorvastatin (LIPITOR) 20 MG tablet Take 20 mg by mouth daily.    . Betahistine HCl (BETAHISTINE DIHYDROCHLORIDE) POWD by Does not apply route.    . cetirizine (ZYRTEC) 10 MG tablet Take 10 mg by mouth daily.    . fenofibrate 54 MG tablet Take 108 mg by mouth daily.     . Multiple Vitamin (MULTIVITAMIN WITH MINERALS) TABS tablet Take 1 tablet by mouth daily.    . Probiotic Product (PROBIOTIC PO) Take 1 tablet by mouth daily.     Marland Kitchen tiZANidine  (ZANAFLEX) 4 MG tablet Take 1 tablet (4 mg total) by mouth every 6 (six) hours as needed for muscle spasms. 30 tablet 6  . topiramate (TOPAMAX) 100 MG tablet TAKE 1 TABLET BY MOUTH TWICE A DAY (Patient taking differently: Take 100 mg by mouth daily.) 180 tablet 0  . Vitamin D, Ergocalciferol, (DRISDOL) 1.25 MG (50000 UNIT) CAPS capsule Take 50,000 Units by mouth once a week.    . zolmitriptan (ZOMIG) 5 MG tablet Take 5 mg by mouth 2 (two) times daily as needed for migraine.    Marland Kitchen AIMOVIG 70 MG/ML SOAJ Inject 70 mg into the skin every 28 (twenty-eight) days. (Patient not taking: Reported on 07/10/2020)    . ARIPiprazole (ABILIFY) 5 MG tablet Take 1 tablet (5 mg total) by mouth daily. 90 tablet 0  . buPROPion (WELLBUTRIN XL) 150 MG 24 hr tablet Take 1 tablet (150 mg total) by mouth daily. 90 tablet 1  . lisdexamfetamine (VYVANSE) 30 MG capsule Take 1 capsule (30 mg total) by mouth daily. 30 capsule 0  . [START ON 08/08/2020] lisdexamfetamine (VYVANSE) 30 MG capsule Take 1 capsule (30 mg total) by mouth daily. 30 capsule 0  . [START ON 07/11/2020] lisdexamfetamine (VYVANSE) 30 MG capsule Take 1 capsule (30 mg total) by mouth daily. 30 capsule 0  . LORazepam (ATIVAN) 0.5 MG tablet Take 0.5 mg by mouth 2 (two) times daily as needed.   1  . promethazine (PHENERGAN) 12.5 MG tablet Take 12.5 mg by mouth every 6 (six) hours as needed. for nausea    . psyllium (REGULOID) 0.52 g capsule Take 0.52 g by mouth daily. (Patient not taking: Reported on 07/10/2020)    . sertraline (ZOLOFT) 100 MG tablet Take 2 tablets (200 mg total) by mouth daily. 180 tablet 0   No current facility-administered medications for this visit.    Medication Side Effects: None  Allergies:  Allergies  Allergen Reactions  . Amoxicillin Other (See Comments)    Numbness and tingling at extremities.  Has patient had a PCN reaction causing immediate rash, facial/tongue/throat swelling, SOB or lightheadedness with hypotension: Yes (rash) Has  patient had a PCN reaction causing severe rash involving mucus membranes or skin necrosis: No Has patient had a PCN reaction that required hospitalization: No Has patient had a PCN reaction occurring within the last 10 years: Yes If all of the above answers are "NO", then may proceed with Cephalosporin use.     Past Medical History:  Diagnosis Date  . Depression with anxiety   . Guillain-Barre (HCC) 12/2007  . Hyperlipemia   . Migraine   . Vertigo     Family History  Problem Relation Age of Onset  . Hyperlipidemia Mother   . Breast cancer Mother   . Hyperlipidemia Father   . Melanoma Father   . Depression Father   . Anxiety disorder Father   . Dementia  Father   . Diabetes Maternal Grandmother   . Bipolar disorder Niece   . ADD / ADHD Niece   . Anxiety disorder Sister   . Depression Sister   . Anxiety disorder Half-Sister   . Depression Half-Sister   . ADD / ADHD Half-Sister     Social History   Socioeconomic History  . Marital status: Single    Spouse name: Not on file  . Number of children: 0  . Years of education: college  . Highest education level: Bachelor's degree (e.g., BA, AB, BS)  Occupational History  . Occupation: Runner, broadcasting/film/video  Tobacco Use  . Smoking status: Never Smoker  . Smokeless tobacco: Never Used  Vaping Use  . Vaping Use: Never used  Substance and Sexual Activity  . Alcohol use: Yes    Comment: 1-2 drinks per month  . Drug use: No  . Sexual activity: Not on file  Other Topics Concern  . Not on file  Social History Narrative   Lives at home alone.   Right-handed.   1 cup coffee per day.   Social Determinants of Health   Financial Resource Strain: Not on file  Food Insecurity: Not on file  Transportation Needs: Not on file  Physical Activity: Not on file  Stress: Not on file  Social Connections: Not on file  Intimate Partner Violence: Not on file    Past Medical History, Surgical history, Social history, and Family history were  reviewed and updated as appropriate.   Please see review of systems for further details on the patient's review from today.   Objective:   Physical Exam:  There were no vitals taken for this visit.  Physical Exam Constitutional:      General: She is not in acute distress. Musculoskeletal:        General: No deformity.  Neurological:     Mental Status: She is alert and oriented to person, place, and time.     Coordination: Coordination normal.  Psychiatric:        Attention and Perception: Attention and perception normal. She does not perceive auditory or visual hallucinations.        Mood and Affect: Mood is anxious and depressed. Affect is not labile, blunt, angry or inappropriate.        Speech: Speech normal.        Behavior: Behavior normal.        Thought Content: Thought content normal. Thought content is not paranoid or delusional. Thought content does not include homicidal or suicidal ideation. Thought content does not include homicidal or suicidal plan.        Cognition and Memory: Cognition and memory normal.        Judgment: Judgment normal.     Comments: Insight intact     Lab Review:     Component Value Date/Time   NA 141 11/19/2019 1422   K 3.9 11/19/2019 1422   CL 108 11/19/2019 1422   CO2 20 (L) 11/19/2019 1422   GLUCOSE 99 11/19/2019 1422   BUN 12 11/19/2019 1422   CREATININE 0.82 11/19/2019 1422   CALCIUM 9.6 11/19/2019 1422   PROT 8.1 11/19/2019 1422   ALBUMIN 4.2 11/19/2019 1422   AST 29 11/19/2019 1422   ALT 42 11/19/2019 1422   ALKPHOS 62 11/19/2019 1422   BILITOT 0.4 11/19/2019 1422   GFRNONAA >60 11/19/2019 1422   GFRAA >60 11/19/2019 1422       Component Value Date/Time   WBC 10.3 11/19/2019 1422  WBC 11.0 (H) 11/26/2017 1730   RBC 4.53 11/19/2019 1423   RBC 4.58 11/19/2019 1422   HGB 13.4 11/19/2019 1422   HCT 39.5 11/19/2019 1422   PLT 320 11/19/2019 1422   MCV 86.2 11/19/2019 1422   MCH 29.3 11/19/2019 1422   MCHC 33.9  11/19/2019 1422   RDW 13.8 11/19/2019 1422   LYMPHSABS 2.5 11/19/2019 1422   MONOABS 0.7 11/19/2019 1422   EOSABS 0.2 11/19/2019 1422   BASOSABS 0.0 11/19/2019 1422    No results found for: POCLITH, LITHIUM   No results found for: PHENYTOIN, PHENOBARB, VALPROATE, CBMZ   .res Assessment: Plan:   Discussed several treatment options for depression to include potential benefits, risks, and side effects of Rexulti and Wellbutrin XL. She reports that she would prefer to re-start Wellbutrin XL since she reports that she felt the best during cost-titration in the past from Sertraline to Wellbutrin XL.  Will start Wellbutrin XL 150 mg po qd for depression.  Continue Abilify 5 mg po qd for depression.  Continue Sertraline 200 mg po qd for depression and anxiety.  Continue Vyvanse 30 mg po qd for ADHD. May consider dose increase or addition of immediate release med in the future.  Pt to follow-up in 1-2 months or sooner if clinically indicated.  Patient advised to contact office with any questions, adverse effects, or acute worsening in signs and symptoms.  [name] was seen today for depression, anxiety and adhd.  Diagnoses and all orders for this visit:  Recurrent major depressive disorder, in partial remission (HCC) -     buPROPion (WELLBUTRIN XL) 150 MG 24 hr tablet; Take 1 tablet (150 mg total) by mouth daily. -     ARIPiprazole (ABILIFY) 5 MG tablet; Take 1 tablet (5 mg total) by mouth daily. -     sertraline (ZOLOFT) 100 MG tablet; Take 2 tablets (200 mg total) by mouth daily.  Generalized anxiety disorder -     sertraline (ZOLOFT) 100 MG tablet; Take 2 tablets (200 mg total) by mouth daily.  Binge eating disorder -     lisdexamfetamine (VYVANSE) 30 MG capsule; Take 1 capsule (30 mg total) by mouth daily.  Attention deficit hyperactivity disorder (ADHD), predominantly inattentive type -     lisdexamfetamine (VYVANSE) 30 MG capsule; Take 1 capsule (30 mg total) by mouth daily. -      lisdexamfetamine (VYVANSE) 30 MG capsule; Take 1 capsule (30 mg total) by mouth daily.     Please see After Visit Summary for patient specific instructions.  Future Appointments  Date Time Provider Department Center  09/07/2020  4:00 PM Corie Chiquito, PMHNP [facility] None    No orders of the defined types were placed in this encounter.   -------------------------------

## 2020-07-18 ENCOUNTER — Ambulatory Visit: Payer: Self-pay | Admitting: Psychiatry

## 2020-09-07 ENCOUNTER — Ambulatory Visit: Payer: BC Managed Care – PPO | Admitting: Psychiatry

## 2020-09-12 ENCOUNTER — Encounter: Payer: Self-pay | Admitting: Psychiatry

## 2020-09-12 ENCOUNTER — Ambulatory Visit (INDEPENDENT_AMBULATORY_CARE_PROVIDER_SITE_OTHER): Payer: BC Managed Care – PPO | Admitting: Psychiatry

## 2020-09-12 ENCOUNTER — Other Ambulatory Visit: Payer: Self-pay

## 2020-09-12 DIAGNOSIS — F900 Attention-deficit hyperactivity disorder, predominantly inattentive type: Secondary | ICD-10-CM | POA: Diagnosis not present

## 2020-09-12 DIAGNOSIS — F3341 Major depressive disorder, recurrent, in partial remission: Secondary | ICD-10-CM

## 2020-09-12 DIAGNOSIS — F411 Generalized anxiety disorder: Secondary | ICD-10-CM | POA: Diagnosis not present

## 2020-09-12 MED ORDER — SERTRALINE HCL 100 MG PO TABS
200.0000 mg | ORAL_TABLET | Freq: Every day | ORAL | 1 refills | Status: DC
Start: 2020-09-12 — End: 2020-12-19

## 2020-09-12 MED ORDER — BUPROPION HCL ER (XL) 300 MG PO TB24
300.0000 mg | ORAL_TABLET | Freq: Every day | ORAL | 0 refills | Status: DC
Start: 2020-09-12 — End: 2020-12-05
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
## 2020-09-12 NOTE — Progress Notes (Signed)
   09/12/20 1651  Facial and Oral Movements  Muscles of Facial Expression 0  Lips and Perioral Area 0  Jaw 0  Tongue 0  Extremity Movements  Upper (arms, wrists, hands, fingers) 0  Lower (legs, knees, ankles, toes) 0  Trunk Movements  Neck, shoulders, hips 0  Overall Severity  Severity of abnormal movements (highest score from questions above) 0  Incapacitation due to abnormal movements 0  Patient's awareness of abnormal movements (rate only patient's report) 1  Dental Status  Current problems with teeth and/or dentures? No  Does patient usually wear dentures? No  AIMS Total Score  AIMS Total Score 1

## 2020-09-13 MED ORDER — LISDEXAMFETAMINE DIMESYLATE 30 MG PO CAPS: 30.0000 mg | ORAL_CAPSULE | Freq: Every day | ORAL | 0 refills | Status: DC

## 2020-11-13 ENCOUNTER — Ambulatory Visit: Payer: BC Managed Care – PPO | Admitting: Psychiatry

## 2020-12-04 ENCOUNTER — Other Ambulatory Visit: Payer: Self-pay | Admitting: Psychiatry

## 2020-12-04 DIAGNOSIS — F3341 Major depressive disorder, recurrent, in partial remission: Secondary | ICD-10-CM

## 2020-12-05 ENCOUNTER — Other Ambulatory Visit: Payer: Self-pay | Admitting: Psychiatry

## 2020-12-05 DIAGNOSIS — F3341 Major depressive disorder, recurrent, in partial remission: Secondary | ICD-10-CM

## 2020-12-19 ENCOUNTER — Telehealth (INDEPENDENT_AMBULATORY_CARE_PROVIDER_SITE_OTHER): Payer: BC Managed Care – PPO | Admitting: Psychiatry

## 2020-12-19 ENCOUNTER — Encounter: Payer: Self-pay | Admitting: Psychiatry

## 2020-12-19 DIAGNOSIS — F3341 Major depressive disorder, recurrent, in partial remission: Secondary | ICD-10-CM | POA: Diagnosis not present

## 2020-12-19 DIAGNOSIS — F411 Generalized anxiety disorder: Secondary | ICD-10-CM

## 2020-12-19 DIAGNOSIS — F5081 Binge eating disorder: Secondary | ICD-10-CM

## 2020-12-19 DIAGNOSIS — F900 Attention-deficit hyperactivity disorder, predominantly inattentive type: Secondary | ICD-10-CM

## 2020-12-19 MED ORDER — LISDEXAMFETAMINE DIMESYLATE 40 MG PO CAPS: 40.0000 mg | ORAL_CAPSULE | ORAL | 0 refills | Status: DC

## 2020-12-19 MED ORDER — SERTRALINE HCL 100 MG PO TABS: 200.0000 mg | ORAL_TABLET | Freq: Every day | ORAL | 1 refills | Status: DC

## 2020-12-19 MED ORDER — BUPROPION HCL ER (XL) 300 MG PO TB24: 300.0000 mg | ORAL_TABLET | Freq: Every day | ORAL | 0 refills | Status: DC

## 2020-12-19 NOTE — Progress Notes (Signed)
[name] [id number] [birth year] 35 y.o.  Virtual Visit via Video Note  I connected with pt @ on 12/20/20 at  2:15 PM EDT by a video enabled telemedicine application and verified that I am speaking with the correct person using two identifiers.   I discussed the limitations of evaluation and management by telemedicine and the availability of in person appointments. The patient expressed understanding and agreed to proceed.  I discussed the assessment and treatment plan with the patient. The patient was provided an opportunity to ask questions and all were answered. The patient agreed with the plan and demonstrated an understanding of the instructions.   The patient was advised to call back or seek an in-person evaluation if the symptoms worsen or if the condition fails to improve as anticipated.  I provided 30 minutes of non-face-to-face time during this encounter.  The patient was located at home.  The provider was located at [facility].   Corie Chiquito, PMHNP   Subjective:   Patient ID:  [name] is a 35 y.o. (DOB 02-Jan-1986) female.  Chief Complaint:  Chief Complaint  Patient presents with   ADHD   Follow-up    Anxiety, depression    HPI [name] presents for follow-up of ADHD, anxiety, and depression . She currently as COVID  and sx's started this week. She reports that she and her parents also got COVID while they were in [place]. She reports that she enjoyed her trip. She reports "the Wellbutrin has helped tremendously. I am no longer sad. I feel good." Describes mood as "content, happy." Denies any recent panic attacks. She reports feeling "pretty balanced." She reports that Vyvanse does not seem to be helping as much. She reports that her motivation has been low- "I'm content but almost paralyzed." She reports that she tries to set goals for herself and will push these off. Will put off dishes, laundry, and taking out the trash. She reports  that her parents had to come over to help her clean. She reports difficulty knowing where to start and "it feels to big." She reports that she feels at times that she needs some accountability with living alone. She reports that her energy is normally ok to hang out with friends or see family. She reports "selective energy" and will avoid certain tasks. She has not noticed her concentration being impaired with activities she enjoys over the summer. Reports that her appetite fluctuates and has been eating "better." She reports that she stopped getting food delivered and is doing more grocery shopping. She notices clothes are fitting loser. She notices a decrease in binge eating. Denies SI.  She reports that the end of the year was stressful and was able to get everything returned. 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 Parents have COVID.   Has not seen therapist recently.   She used Ativan prn before flying on trip. Denies recent panic attacks.   Past Psychiatric Medication Trials: Sertraline- Has taken since 2020. Has helped with depression. Initially seemed to help Lexapro- Took in 2015. Increased up to 20 mg. Took for a couple of years. Reports that it was helpful and may have stopped working. Effexor- Prescribed for Vestibular migraines Wellbutrin-Denies side effects. Started in July. Took Wellbutrin XL 300 mg in the past Abilify- Seemed to be effective initially. Ativan- Took as monotherapy in the past and "was a zombie." Clonidine- Ineffective Propranolol-Ineffective Topamax   Review of Systems:  Review of Systems  Respiratory:  Positive for cough.   Cardiovascular:  Negative for palpitations.  Musculoskeletal:  Negative for gait problem.  Neurological:  Negative for tremors.  Psychiatric/Behavioral:         Please refer to HPI   Medications: I have reviewed the patient's current medications.  Current Outpatient Medications   Medication Sig Dispense Refill   atorvastatin (LIPITOR) 20 MG tablet Take 20 mg by mouth daily.     Betahistine HCl (BETAHISTINE DIHYDROCHLORIDE) POWD by Does not apply route.     cetirizine (ZYRTEC) 10 MG tablet Take 10 mg by mouth daily.     Cholecalciferol (VITAMIN D3) 125 MCG (5000 UT) CAPS Take by mouth.     fenofibrate 54 MG tablet Take 108 mg by mouth 2 (two) times daily.     lisdexamfetamine (VYVANSE) 40 MG capsule Take 1 capsule (40 mg total) by mouth every morning. 30 capsule 0   LORazepam (ATIVAN) 0.5 MG tablet Take 0.5 mg by mouth 2 (two) times daily as needed.   1   Multiple Vitamin (MULTIVITAMIN WITH MINERALS) TABS tablet Take 1 tablet by mouth daily.     Probiotic Product (PROBIOTIC PO) Take 1 tablet by mouth daily.      promethazine (PHENERGAN) 12.5 MG tablet Take 12.5 mg by mouth every 6 (six) hours as needed. for nausea     Rimegepant Sulfate (NURTEC) 75 MG TBDP Take by mouth.     tiZANidine (ZANAFLEX) 4 MG tablet Take 1 tablet (4 mg total) by mouth every 6 (six) hours as needed for muscle spasms. 30 tablet 6   zolmitriptan (ZOMIG) 5 MG tablet Take 5 mg by mouth 2 (two) times daily as needed for migraine.     buPROPion (WELLBUTRIN XL) 300 MG 24 hr tablet Take 1 tablet (300 mg total) by mouth daily. 90 tablet 0   psyllium (REGULOID) 0.52 g capsule Take 0.52 g by mouth daily. (Patient not taking: No sig reported)     sertraline (ZOLOFT) 100 MG tablet Take 2 tablets (200 mg total) by mouth daily. 180 tablet 1   zolmitriptan (ZOMIG) 5 MG tablet 1 tablet     No current facility-administered medications for this visit.    Medication Side Effects: None  Allergies:  Allergies  Allergen Reactions   Amoxicillin Other (See Comments)    Numbness and tingling at extremities.  Has patient had a PCN reaction causing immediate rash, facial/tongue/throat swelling, SOB or lightheadedness with hypotension: Yes (rash) Has patient had a PCN reaction causing severe rash involving mucus  membranes or skin necrosis: No Has patient had a PCN reaction that required hospitalization: No Has patient had a PCN reaction occurring within the last 10 years: Yes If all of the above answers are "NO", then may proceed with Cephalosporin use.     Past Medical History:  Diagnosis Date   Depression with anxiety    Guillain-Barre (HCC) 12/2007   Hyperlipemia    Migraine    Vertigo     Family History  Problem Relation Age of Onset   Hyperlipidemia Mother    Breast cancer Mother    Hyperlipidemia Father    Melanoma Father    Depression Father    Anxiety disorder Father    Dementia Father    Diabetes Maternal Grandmother    Bipolar disorder Niece    ADD / ADHD Niece    Anxiety disorder Sister    Depression Sister    Anxiety disorder Half-Sister    Depression Half-Sister  ADD / ADHD Half-Sister     Social History   Socioeconomic History   Marital status: Single    Spouse name: Not on file   Number of children: 0   Years of education: college   Highest education level: Bachelor's degree (e.g., BA, AB, BS)  Occupational History   Occupation: Teacher  Tobacco Use   Smoking status: Never   Smokeless tobacco: Never  Vaping Use   Vaping Use: Never used  Substance and Sexual Activity   Alcohol use: Yes    Comment: 1-2 drinks per month   Drug use: No   Sexual activity: Not on file  Other Topics Concern   Not on file  Social History Narrative   Lives at home alone.   Right-handed.   1 cup coffee per day.   Social Determinants of Health   Financial Resource Strain: Not on file  Food Insecurity: Not on file  Transportation Needs: Not on file  Physical Activity: Not on file  Stress: Not on file  Social Connections: Not on file  Intimate Partner Violence: Not on file    Past Medical History, Surgical history, Social history, and Family history were reviewed and updated as appropriate.   Please see review of systems for further details on the patient's  review from today.   Objective:   Physical Exam:  There were no vitals taken for this visit.  Physical Exam Constitutional:      Appearance: She is ill-appearing.  Musculoskeletal:        General: No deformity.  Neurological:     Mental Status: She is alert and oriented to person, place, and time.     Coordination: Coordination normal.  Psychiatric:        Attention and Perception: Attention and perception normal. She does not perceive auditory or visual hallucinations.        Mood and Affect: Mood normal. Mood is not anxious or depressed. Affect is not labile, blunt, angry or inappropriate.        Speech: Speech normal.        Behavior: Behavior normal.        Thought Content: Thought content normal. Thought content is not paranoid or delusional. Thought content does not include homicidal or suicidal ideation. Thought content does not include homicidal or suicidal plan.        Cognition and Memory: Cognition and memory normal.        Judgment: Judgment normal.     Comments: Insight intact    Lab Review:     Component Value Date/Time   NA 141 11/19/2019 1422   K 3.9 11/19/2019 1422   CL 108 11/19/2019 1422   CO2 20 (L) 11/19/2019 1422   GLUCOSE 99 11/19/2019 1422   BUN 12 11/19/2019 1422   CREATININE 0.82 11/19/2019 1422   CALCIUM 9.6 11/19/2019 1422   PROT 8.1 11/19/2019 1422   ALBUMIN 4.2 11/19/2019 1422   AST 29 11/19/2019 1422   ALT 42 11/19/2019 1422   ALKPHOS 62 11/19/2019 1422   BILITOT 0.4 11/19/2019 1422   GFRNONAA >60 11/19/2019 1422   GFRAA >60 11/19/2019 1422       Component Value Date/Time   WBC 10.3 11/19/2019 1422   WBC 11.0 (H) 11/26/2017 1730   RBC 4.53 11/19/2019 1423   RBC 4.58 11/19/2019 1422   HGB 13.4 11/19/2019 1422   HCT 39.5 11/19/2019 1422   PLT 320 11/19/2019 1422   MCV 86.2 11/19/2019 1422   MCH 29.3 11/19/2019  1422   MCHC 33.9 11/19/2019 1422   RDW 13.8 11/19/2019 1422   LYMPHSABS 2.5 11/19/2019 1422   MONOABS 0.7 11/19/2019  1422   EOSABS 0.2 11/19/2019 1422   BASOSABS 0.0 11/19/2019 1422    No results found for: POCLITH, LITHIUM   No results found for: PHENYTOIN, PHENOBARB, VALPROATE, CBMZ   .res Assessment: Plan:   Pt seen for 30 minutes and time spent discussing potential benefits, risks, and side effects of increasing Vyvanse to 40 mg po qd since she had some improvement in ADHD s/s with 30 mg dose and is experiencing some residual ADHD s/s. Discussed that getting started with tasks may be due to ADHD. She agrees to increase in Vyvanse to 40 mg po qd.  Continue Wellbutrin XL 300 mg po qd for depression.  Continue Sertraline 200 mg po qd for anxiety and depression.  Recommend continuing therapy.  Pt to follow-up in 3 months or sooner if clinically indicated. Pt to notify provider how she is tolerating 40 mg dose after 3-4 weeks.  Patient advised to contact office with any questions, adverse effects, or acute worsening in signs and symptoms.   [name] was seen today for adhd and follow-up.  Diagnoses and all orders for this visit:  Attention deficit hyperactivity disorder (ADHD), predominantly inattentive type -     lisdexamfetamine (VYVANSE) 40 MG capsule; Take 1 capsule (40 mg total) by mouth every morning.  Recurrent major depressive disorder, in partial remission (HCC) -     buPROPion (WELLBUTRIN XL) 300 MG 24 hr tablet; Take 1 tablet (300 mg total) by mouth daily. -     sertraline (ZOLOFT) 100 MG tablet; Take 2 tablets (200 mg total) by mouth daily.  Generalized anxiety disorder -     sertraline (ZOLOFT) 100 MG tablet; Take 2 tablets (200 mg total) by mouth daily.  Binge eating disorder -     lisdexamfetamine (VYVANSE) 40 MG capsule; Take 1 capsule (40 mg total) by mouth every morning.    Please see After Visit Summary for patient specific instructions.  No future appointments.  No orders of the defined types were placed in this encounter.     -------------------------------

## 2020-12-21 ENCOUNTER — Telehealth: Payer: Self-pay | Admitting: Psychiatry

## 2020-12-21 DIAGNOSIS — F5081 Binge eating disorder: Secondary | ICD-10-CM

## 2020-12-21 DIAGNOSIS — F900 Attention-deficit hyperactivity disorder, predominantly inattentive type: Secondary | ICD-10-CM

## 2020-12-21 MED ORDER — LISDEXAMFETAMINE DIMESYLATE 40 MG PO CAPS: 40.0000 mg | ORAL_CAPSULE | ORAL | 0 refills | Status: DC

## 2020-12-21 NOTE — Telephone Encounter (Signed)
Pharmacy requesting script be re-submitted with supervising MD info/DEA. Script sent.

## 2020-12-22 NOTE — Telephone Encounter (Signed)
[name] called because the pharmacy has told her the new dose of 40mg  needs a PA.  She has a PA for the 30mg , so it is possible a new PA needs to be done because of the increase.  She has not been able to get the 40mg  yet so can't report yet on how it is working.  Please submit a PA.

## 2020-12-28 ENCOUNTER — Telehealth: Payer: Self-pay

## 2020-12-28 NOTE — Telephone Encounter (Signed)
Left pt message with information

## 2020-12-28 NOTE — Telephone Encounter (Signed)
Prior Authorization submitted and approved for VYVANSE 40 MG effective 12/27/2020-12/28/2023 with [facility]

## 2021-01-08 MED ORDER — LISDEXAMFETAMINE DIMESYLATE 40 MG PO CAPS: 40.0000 mg | ORAL_CAPSULE | ORAL | 0 refills | Status: DC

## 2021-03-07 ENCOUNTER — Other Ambulatory Visit: Payer: Self-pay

## 2021-03-07 ENCOUNTER — Ambulatory Visit: Payer: BC Managed Care – PPO | Admitting: Psychiatry

## 2021-03-07 ENCOUNTER — Encounter: Payer: Self-pay | Admitting: Psychiatry

## 2021-03-07 DIAGNOSIS — F900 Attention-deficit hyperactivity disorder, predominantly inattentive type: Secondary | ICD-10-CM

## 2021-03-07 DIAGNOSIS — F5081 Binge eating disorder: Secondary | ICD-10-CM

## 2021-03-07 DIAGNOSIS — F3341 Major depressive disorder, recurrent, in partial remission: Secondary | ICD-10-CM | POA: Diagnosis not present

## 2021-03-07 DIAGNOSIS — F411 Generalized anxiety disorder: Secondary | ICD-10-CM

## 2021-03-07 MED ORDER — BUPROPION HCL ER (XL) 300 MG PO TB24: 300.0000 mg | ORAL_TABLET | Freq: Every day | ORAL | 1 refills | Status: DC

## 2021-03-07 MED ORDER — LISDEXAMFETAMINE DIMESYLATE 40 MG PO CAPS: 40.0000 mg | ORAL_CAPSULE | ORAL | 0 refills | Status: DC

## 2021-03-07 MED ORDER — SERTRALINE HCL 100 MG PO TABS: 200.0000 mg | ORAL_TABLET | Freq: Every day | ORAL | 1 refills | Status: DC

## 2021-03-07 MED ORDER — LORAZEPAM 0.5 MG PO TABS: 0.5000 mg | ORAL_TABLET | Freq: Two times a day (BID) | ORAL | 0 refills | Status: DC | PRN

## 2021-03-07 NOTE — Progress Notes (Signed)
[name] [id number] December 07, 1985 35 y.o.  Subjective:   Patient ID:  [name] is a 35 y.o. (DOB [birth year]) female.  Chief Complaint:  Chief Complaint  Patient presents with   Follow-up    ADHD, Anxiety, and depression     HPI [name] presents to the office today for follow-up of ADHD, Anxiety, and depression.  She reports, "I've been doing great... everything has kind of leveled out." Denies depressed mood. "I feel the best I have in a long time."  She reports energy and motivation have been improving. She reports that she has been consistently on time for work. Improved concentration with Vyvanse. Reports that she is getting all her work done. 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   Had a lock down at school and handled this well without panic. Cannot recall when she last had panic. She reports that anxiety is "always there, I'm just managing it better." She reports not dwelling on "the what if's."   She reports that she has lost some weight. She is on the weight list for [employer] [facility]. Reports eating 3 meals daily and binge eating has improved.  Sleeping well. She reports, "I still sleep a lot." No longer taking naps. Going to bed at 9 pm and waking up at 5:45 am. Sleeping through the night. Denies SI.   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  Father's illness is progressing.  She reports that she is interested in doing EMDR.   Has not used Lorazepam prn recently. Took Lorazepam prn for flying  Past Psychiatric Medication Trials: Sertraline- Has taken since 2020. Has helped with depression. Initially seemed to help Lexapro- Took in 2015. Increased up to 20 mg. Took for a couple of years. Reports that it was helpful and may have stopped working. Effexor- Prescribed for Vestibular migraines Wellbutrin-Denies side effects. Started in  July. Took Wellbutrin XL 300 mg in the past Abilify- Seemed to be effective initially. Ativan- Took as monotherapy in the past and "was a zombie." Clonidine- Ineffective Propranolol-Ineffective Topamax   AIMS    Flowsheet Row Office Visit from 09/12/2020 in [facility] Office Visit from 07/10/2020 in [facility]  AIMS Total Score 1 0        Review of Systems:  Review of Systems  Cardiovascular:  Negative for palpitations.  Musculoskeletal:  Negative for gait problem.  Neurological:  Positive for tremors. Negative for headaches.  Psychiatric/Behavioral:         Please refer to HPI   Medications: I have reviewed the patient's current medications.  Current Outpatient Medications  Medication Sig Dispense Refill   atorvastatin (LIPITOR) 20 MG tablet Take 20 mg by mouth daily.     Betahistine HCl (BETAHISTINE DIHYDROCHLORIDE) POWD by Does not apply route.     cetirizine (ZYRTEC) 10 MG tablet Take 10 mg by mouth daily.     Cholecalciferol (VITAMIN D3) 125 MCG (5000 UT) CAPS Take by mouth.     fenofibrate 54 MG tablet Take 108 mg by mouth 2 (two) times daily.     Multiple Vitamin (MULTIVITAMIN WITH MINERALS) TABS tablet Take 1 tablet by mouth daily.     Probiotic Product (PROBIOTIC PO) Take 1 tablet by mouth daily.      buPROPion (WELLBUTRIN XL) 300 MG 24 hr tablet Take 1 tablet (300 mg total)  by mouth daily. 90 tablet 1   [START ON 05/31/2021] lisdexamfetamine (VYVANSE) 40 MG capsule Take 1 capsule (40 mg total) by mouth every morning. 30 capsule 0   [START ON 05/03/2021] lisdexamfetamine (VYVANSE) 40 MG capsule Take 1 capsule (40 mg total) by mouth every morning. 30 capsule 0   [START ON 04/05/2021] lisdexamfetamine (VYVANSE) 40 MG capsule Take 1 capsule (40 mg total) by mouth every morning. 30 capsule 0   LORazepam (ATIVAN) 0.5 MG tablet Take 1 tablet (0.5 mg total) by mouth 2 (two) times daily as needed. 30 tablet 0   promethazine (PHENERGAN) 12.5 MG  tablet Take 12.5 mg by mouth every 6 (six) hours as needed. for nausea     psyllium (REGULOID) 0.52 g capsule Take 0.52 g by mouth daily. (Patient not taking: No sig reported)     Rimegepant Sulfate (NURTEC) 75 MG TBDP Take by mouth.     sertraline (ZOLOFT) 100 MG tablet Take 2 tablets (200 mg total) by mouth daily. 180 tablet 1   tiZANidine (ZANAFLEX) 4 MG tablet Take 1 tablet (4 mg total) by mouth every 6 (six) hours as needed for muscle spasms. 30 tablet 6   zolmitriptan (ZOMIG) 5 MG tablet Take 5 mg by mouth 2 (two) times daily as needed for migraine.     zolmitriptan (ZOMIG) 5 MG tablet 1 tablet     No current facility-administered medications for this visit.    Medication Side Effects: None  Allergies:  Allergies  Allergen Reactions   Amoxicillin Other (See Comments)    Numbness and tingling at extremities.  Has patient had a PCN reaction causing immediate rash, facial/tongue/throat swelling, SOB or lightheadedness with hypotension: Yes (rash) Has patient had a PCN reaction causing severe rash involving mucus membranes or skin necrosis: No Has patient had a PCN reaction that required hospitalization: No Has patient had a PCN reaction occurring within the last 10 years: Yes If all of the above answers are "NO", then may proceed with Cephalosporin use.     Past Medical History:  Diagnosis Date   Depression with anxiety    Guillain-Barre (HCC) 12/2007   Hyperlipemia    Migraine    Vertigo     Past Medical History, Surgical history, Social history, and Family history were reviewed and updated as appropriate.   Please see review of systems for further details on the patient's review from today.   Objective:   Physical Exam:  BP (!) 141/84   Pulse 89   Wt 231 lb (104.8 kg)   BMI 37.28 kg/m   Physical Exam Constitutional:      General: She is not in acute distress. Musculoskeletal:        General: No deformity.  Neurological:     Mental Status: She is alert and  oriented to person, place, and time.     Coordination: Coordination normal.  Psychiatric:        Attention and Perception: Attention and perception normal. She does not perceive auditory or visual hallucinations.        Mood and Affect: Mood is not depressed. Affect is not labile, blunt, angry or inappropriate.        Speech: Speech normal.        Behavior: Behavior normal.        Thought Content: Thought content normal. Thought content is not paranoid or delusional. Thought content does not include homicidal or suicidal ideation. Thought content does not include homicidal or suicidal plan.  Cognition and Memory: Cognition and memory normal.        Judgment: Judgment normal.     Comments: Insight intact Mood is mildly anxious    Lab Review:     Component Value Date/Time   NA 141 11/19/2019 1422   K 3.9 11/19/2019 1422   CL 108 11/19/2019 1422   CO2 20 (L) 11/19/2019 1422   GLUCOSE 99 11/19/2019 1422   BUN 12 11/19/2019 1422   CREATININE 0.82 11/19/2019 1422   CALCIUM 9.6 11/19/2019 1422   PROT 8.1 11/19/2019 1422   ALBUMIN 4.2 11/19/2019 1422   AST 29 11/19/2019 1422   ALT 42 11/19/2019 1422   ALKPHOS 62 11/19/2019 1422   BILITOT 0.4 11/19/2019 1422   GFRNONAA >60 11/19/2019 1422   GFRAA >60 11/19/2019 1422       Component Value Date/Time   WBC 10.3 11/19/2019 1422   WBC 11.0 (H) 11/26/2017 1730   RBC 4.53 11/19/2019 1423   RBC 4.58 11/19/2019 1422   HGB 13.4 11/19/2019 1422   HCT 39.5 11/19/2019 1422   PLT 320 11/19/2019 1422   MCV 86.2 11/19/2019 1422   MCH 29.3 11/19/2019 1422   MCHC 33.9 11/19/2019 1422   RDW 13.8 11/19/2019 1422   LYMPHSABS 2.5 11/19/2019 1422   MONOABS 0.7 11/19/2019 1422   EOSABS 0.2 11/19/2019 1422   BASOSABS 0.0 11/19/2019 1422    No results found for: POCLITH, LITHIUM   No results found for: PHENYTOIN, PHENOBARB, VALPROATE, CBMZ   .res Assessment: Plan:   Will continue current plan of care since target signs and symptoms are  well controlled without any tolerability issues. Pt reports that she is interested in starting EMDR to address possible past events that may be affecting her currently. Referral made to [facility].  Will continue Wellbutrin XL 300 mg po qd for depression.  Continue Sertraline 200 mg po qd for depression and anxiety.  Continue Vyvanse 40 mg po qd for ADHD.  Continue Ativan 0.5 mg po BID prn anxiety.  Pt to follow-up in 3 months or sooner if clinically indicated.  Patient advised to contact office with any questions, adverse effects, or acute worsening in signs and symptoms.   Yashika was seen today for follow-up.  Diagnoses and all orders for this visit:  Recurrent major depressive disorder, in partial remission (HCC) -     buPROPion (WELLBUTRIN XL) 300 MG 24 hr tablet; Take 1 tablet (300 mg total) by mouth daily. -     sertraline (ZOLOFT) 100 MG tablet; Take 2 tablets (200 mg total) by mouth daily.  Attention deficit hyperactivity disorder (ADHD), predominantly inattentive type -     lisdexamfetamine (VYVANSE) 40 MG capsule; Take 1 capsule (40 mg total) by mouth every morning. -     lisdexamfetamine (VYVANSE) 40 MG capsule; Take 1 capsule (40 mg total) by mouth every morning. -     lisdexamfetamine (VYVANSE) 40 MG capsule; Take 1 capsule (40 mg total) by mouth every morning.  Binge eating disorder -     lisdexamfetamine (VYVANSE) 40 MG capsule; Take 1 capsule (40 mg total) by mouth every morning.  Generalized anxiety disorder -     sertraline (ZOLOFT) 100 MG tablet; Take 2 tablets (200 mg total) by mouth daily. -     LORazepam (ATIVAN) 0.5 MG tablet; Take 1 tablet (0.5 mg total) by mouth 2 (two) times daily as needed.    Please see After Visit Summary for patient specific instructions.  Future Appointments  Date Time Provider Department Center  05/17/2021  4:00 PM Stevphen Meuse, [facility] None  06/07/2021  4:00 PM Corie Chiquito, PMHNP CP-CP None    No orders of the  defined types were placed in this encounter.   -------------------------------

## 2021-04-06 ENCOUNTER — Other Ambulatory Visit: Payer: Self-pay | Admitting: Psychiatry

## 2021-04-06 DIAGNOSIS — F3341 Major depressive disorder, recurrent, in partial remission: Secondary | ICD-10-CM

## 2021-05-06 ENCOUNTER — Other Ambulatory Visit: Payer: Self-pay | Admitting: Psychiatry

## 2021-05-06 DIAGNOSIS — F3341 Major depressive disorder, recurrent, in partial remission: Secondary | ICD-10-CM

## 2021-05-17 ENCOUNTER — Ambulatory Visit: Payer: BC Managed Care – PPO | Admitting: Psychiatry

## 2021-05-17 ENCOUNTER — Other Ambulatory Visit: Payer: Self-pay

## 2021-05-17 DIAGNOSIS — F411 Generalized anxiety disorder: Secondary | ICD-10-CM | POA: Diagnosis not present
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
## 2021-06-07 ENCOUNTER — Ambulatory Visit: Payer: BC Managed Care – PPO | Admitting: Psychiatry

## 2021-06-07 ENCOUNTER — Other Ambulatory Visit: Payer: Self-pay

## 2021-06-07 ENCOUNTER — Encounter: Payer: Self-pay | Admitting: Psychiatry

## 2021-06-07 DIAGNOSIS — F411 Generalized anxiety disorder: Secondary | ICD-10-CM

## 2021-06-07 DIAGNOSIS — F3341 Major depressive disorder, recurrent, in partial remission: Secondary | ICD-10-CM | POA: Diagnosis not present

## 2021-06-07 DIAGNOSIS — F900 Attention-deficit hyperactivity disorder, predominantly inattentive type: Secondary | ICD-10-CM

## 2021-06-07 MED ORDER — SERTRALINE HCL 100 MG PO TABS: 200.0000 mg | ORAL_TABLET | Freq: Every day | ORAL | 0 refills | Status: DC

## 2021-06-07 MED ORDER — LISDEXAMFETAMINE DIMESYLATE 40 MG PO CAPS: 40.0000 mg | ORAL_CAPSULE | ORAL | 0 refills | Status: DC

## 2021-06-07 MED ORDER — BUPROPION HCL ER (XL) 300 MG PO TB24: 300.0000 mg | ORAL_TABLET | Freq: Every day | ORAL | 0 refills | Status: DC

## 2021-06-07 MED ORDER — LORAZEPAM 0.5 MG PO TABS: 0.5000 mg | ORAL_TABLET | Freq: Two times a day (BID) | ORAL | 0 refills | Status: DC | PRN
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
## 2021-07-11 ENCOUNTER — Ambulatory Visit (INDEPENDENT_AMBULATORY_CARE_PROVIDER_SITE_OTHER): Payer: Self-pay | Admitting: Family Medicine

## 2021-07-23 ENCOUNTER — Ambulatory Visit: Payer: BC Managed Care – PPO | Admitting: Psychiatry

## 2021-07-25 ENCOUNTER — Ambulatory Visit (INDEPENDENT_AMBULATORY_CARE_PROVIDER_SITE_OTHER): Payer: Self-pay | Admitting: Family Medicine

## 2021-08-06 ENCOUNTER — Ambulatory Visit: Payer: BC Managed Care – PPO | Admitting: Psychiatry

## 2021-08-06 ENCOUNTER — Other Ambulatory Visit: Payer: Self-pay

## 2021-08-06 DIAGNOSIS — F411 Generalized anxiety disorder: Secondary | ICD-10-CM

## 2021-08-06 NOTE — Progress Notes (Signed)
?      [facility] Counselor/Therapist Progress Note ? ?Patient ID: [name], MRN: [id number],   ? ?Date: 08/06/2021 ? ?Time Spent: 59 minutes start time 3:58 PM end time 4:57 PM ? ?Treatment Type: Individual Therapy ? ?Reported Symptoms: anxiety, triggered responses, sadness, difficulty starting projects ? ?Mental Status Exam: ? ?Appearance:   Well Groomed     ?Behavior:  Appropriate  ?Motor:  Normal  ?Speech/Language:   Normal Rate  ?Affect:  Appropriate  ?Mood:  anxious  ?Thought process:  normal  ?Thought content:    WNL  ?Sensory/Perceptual disturbances:    WNL  ?Orientation:  oriented to person, place, time/date, and situation  ?Attention:  Good  ?Concentration:  Good  ?Memory:  WNL  ?Fund of knowledge:   Good  ?Insight:    Good  ?Judgment:   Good  ?Impulse Control:  Good  ? ?Risk Assessment: ?Danger to Self:  No ?Self-injurious Behavior: No ?Danger to Others: No ?Duty to Warn:no ?Physical Aggression / Violence:No  ?Access to Firearms a concern: No  ?Gang Involvement:No  ? ?Subjective: Patient was present for session.  She shared she was having lots of struggles but she is feeling better currently.  She shared she is realizing that not feeling heard and validated was an issue for her as a child. She shared that as a child when she showed emotion she was sent to her room and it was not addressed.  Going to the gym twice a week which has been helpful.  Developed treatment plan and goals in session.  Patient then did processing set on throwing up with her mom's friend suds level 6, negative cognition "I am out of control" felt uncomfortable in her in her shoulders jaw and upper body.  Patient responded very well to EMDR and was able to reduce suds level to 1.  She was able to recognize that she was showing a lot of control for being able to survive the whole situation and learn how to walk again.  Patient was encouraged to remind herself that she is enough and been able to survive a lot in her  life. ? ?Interventions: Solution-Oriented/Positive Psychology and Eye Movement Desensitization and Reprocessing (EMDR) ? ?Diagnosis: ?  ICD-10-CM   ?1. Generalized anxiety disorder  F41.1   ?  ? ? ?Plan: Patient is to use coping skills to decrease anxiety symptoms.  Patient is to remind herself that she is enough and getting control so she has survived multiple difficult situations.  Patient is to exercise to release negative emotions appropriately ?Long-term goal: Resolve the core conflict that is a source of anxiety.  Enhance ability to handle effectively the full variety of life's anxieties ?Short-term goal: Identify the major life complex in the past and present the form the basis for present anxiety ? ?Lina Sayre, [facility] ? ? ? ? ? ? ? ? ? ? ? ? ? ? ? ? ? ? ?

## 2021-08-20 ENCOUNTER — Ambulatory Visit: Payer: BC Managed Care – PPO | Admitting: Psychiatry

## 2021-08-20 ENCOUNTER — Other Ambulatory Visit: Payer: Self-pay

## 2021-08-20 DIAGNOSIS — F411 Generalized anxiety disorder: Secondary | ICD-10-CM
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
## 2021-09-03 ENCOUNTER — Ambulatory Visit: Payer: BC Managed Care – PPO | Admitting: Psychiatry

## 2021-09-03 DIAGNOSIS — F411 Generalized anxiety disorder: Secondary | ICD-10-CM
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
## 2021-09-04 ENCOUNTER — Ambulatory Visit (INDEPENDENT_AMBULATORY_CARE_PROVIDER_SITE_OTHER): Payer: Self-pay | Admitting: Psychiatry

## 2021-09-04 DIAGNOSIS — Z91199 Patient's noncompliance with other medical treatment and regimen due to unspecified reason: Secondary | ICD-10-CM

## 2021-09-06 NOTE — Progress Notes (Signed)
Patient no show appointment. ? ?

## 2021-09-11 ENCOUNTER — Telehealth (INDEPENDENT_AMBULATORY_CARE_PROVIDER_SITE_OTHER): Payer: BC Managed Care – PPO | Admitting: Psychiatry

## 2021-09-11 ENCOUNTER — Encounter: Payer: Self-pay | Admitting: Psychiatry

## 2021-09-11 DIAGNOSIS — F900 Attention-deficit hyperactivity disorder, predominantly inattentive type: Secondary | ICD-10-CM

## 2021-09-11 DIAGNOSIS — F411 Generalized anxiety disorder: Secondary | ICD-10-CM | POA: Diagnosis not present

## 2021-09-11 DIAGNOSIS — F3341 Major depressive disorder, recurrent, in partial remission: Secondary | ICD-10-CM | POA: Diagnosis not present

## 2021-09-11 DIAGNOSIS — F5081 Binge eating disorder: Secondary | ICD-10-CM

## 2021-09-11 MED ORDER — BUPROPION HCL ER (XL) 300 MG PO TB24: 300.0000 mg | ORAL_TABLET | Freq: Every day | ORAL | 1 refills | Status: DC

## 2021-09-11 MED ORDER — LISDEXAMFETAMINE DIMESYLATE 40 MG PO CAPS: 40.0000 mg | ORAL_CAPSULE | ORAL | 0 refills | Status: DC

## 2021-09-11 MED ORDER — SERTRALINE HCL 100 MG PO TABS: 200.0000 mg | ORAL_TABLET | Freq: Every day | ORAL | 1 refills | Status: DC

## 2021-09-11 NOTE — Progress Notes (Signed)
Pamela Mascotmanda D Peddie ?[id number] ?1985-12-27 ?36 y.o. ? ?Virtual Visit via Video Note ? ?I connected with pt @ on 09/11/21 at 10:30 AM EDT by a video enabled telemedicine application and verified that I am speaking with the correct person using two identifiers. ?  ?I discussed the limitations of evaluation and management by telemedicine and the availability of in person appointments. The patient expressed understanding and agreed to proceed. ? ?I discussed the assessment and treatment plan with the patient. The patient was provided an opportunity to ask questions and all were answered. The patient agreed with the plan and demonstrated an understanding of the instructions. ?  ?The patient was advised to call back or seek an in-person evaluation if the symptoms worsen or if the condition fails to improve as anticipated. ? ?I provided 35 minutes of non-face-to-face time during this encounter.  The patient was located at home.  The provider was located at home. ? ? ?Corie ChiquitoJessica Teddy Pena, PMHNP  ? ?Subjective:  ? ?Patient ID:  [name] is a 36 y.o. (DOB 1985-12-27) female. ? ?Chief Complaint:  ?Chief Complaint  ?Patient presents with  ? Follow-up  ?  Depression, Anxiety, and ADHD  ? ? ?HPI ?[name] presents for follow-up of anxiety, depression, and ADHD. "I feel more balanced." She reports that she has started EMDR and feels she is "getting a lot out of it." She reports that her "anxiety seems to be under control." She reports that seasonal depression has resolved. Has not had any recent panic attacks. She reports that she is "able to take a step back" and take deep breaths when feeling overly stimulated. She reports that she has "been able to let stuff go" during EMDR and this has helped with rumination. She notices some worry which has been more manageable. She notices some difficulty with executive dysfunction. She reports difficulty following through with different goals and plans she makes for herself. She  reports that she is able to take care of herself and her dogs "at a bare minimum." She reports that motivation is lower than she would like. She notices some procrastination since the start of the pandemic. Energy was very low when Vit D levels were very low. She reports, "I'm not physically tired. It's more a mental exhaustion." She reports adequate sleep. Appetite has ben ok. She has been losing some weight. She has been eating lunch or a protein shake and not snacking as much. She reports that portion sizes are less. Less stress eating. Concentration is adequate at work. 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 Occ forgetful. Denies SI.  ? ?She is currently on spring break. 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  ? ?Has not needed Ativan prn recently.  ? ? ?Past Psychiatric Medication Trials: ?Sertraline- Has taken since 2020. Has helped with depression. Initially seemed to help ?Lexapro- Took in 2015. Increased up to 20 mg. Took for a couple of years. Reports that it was helpful and may have stopped working. ?Effexor- Prescribed for Vestibular migraines ?Wellbutrin-Denies side effects. Started in July. Took Wellbutrin XL 300 mg in the past ?Abilify- Seemed to be effective initially. ?Ativan- Took as monotherapy in the past and "was a zombie." ?Clonidine- Ineffective ?Propranolol-Ineffective ?Topamax ? ?Review of Systems:  ?Review of Systems  ?Musculoskeletal:  Negative for gait problem.  ?Neurological:  Negative for dizziness.  ?     Occ headaches and not full migraines  ?Psychiatric/Behavioral:    ?     Please refer to HPI  ? ?Medications: I have reviewed the patient's current medications. ? ?Current Outpatient Medications  ?Medication Sig Dispense Refill  ? atorvastatin (LIPITOR) 20 MG tablet Take 20 mg by mouth daily.    ? cetirizine (ZYRTEC) 10 MG tablet Take 10  mg by mouth daily.    ? Cholecalciferol (VITAMIN D3) 125 MCG (5000 UT) CAPS Take by mouth.    ? fenofibrate 54 MG tablet Take 108 mg by mouth 2 (two) times daily.    ? MELATONIN PO Take by mouth at bedtime as needed.    ? Multiple Vitamin (MULTIVITAMIN WITH MINERALS) TABS tablet Take 1 tablet by mouth daily.    ? Probiotic Product (PROBIOTIC PO) Take 1 tablet by mouth daily.     ? promethazine (PHENERGAN) 12.5 MG tablet Take 12.5 mg by mouth every 6 (six) hours as needed. for nausea    ? Rimegepant Sulfate (NURTEC) 75 MG TBDP Take by mouth daily as needed.    ? tiZANidine (ZANAFLEX) 4 MG tablet Take 1 tablet (4 mg total) by mouth every 6 (six) hours as needed for muscle spasms. 30 tablet 6  ? zolmitriptan (ZOMIG) 5 MG tablet Take 5 mg by mouth 2 (two) times daily as needed for migraine.    ? Betahistine HCl (BETAHISTINE DIHYDROCHLORIDE) POWD by Does not apply route.    ? buPROPion (WELLBUTRIN XL) 300 MG 24 hr tablet Take 1 tablet (300 mg total) by mouth daily. 90 tablet 1  ? [START ON 11/06/2021] lisdexamfetamine (VYVANSE) 40 MG capsule Take 1 capsule (40 mg total) by mouth every morning. 30 capsule 0  ? [START ON 10/09/2021] lisdexamfetamine (VYVANSE) 40 MG capsule Take 1 capsule (40 mg total) by mouth every morning. 30 capsule 0  ? lisdexamfetamine (VYVANSE) 40 MG capsule Take 1 capsule (40 mg total) by mouth every morning. 30 capsule 0  ? LORazepam (ATIVAN) 0.5 MG tablet Take 1 tablet (0.5 mg total) by mouth 2 (two) times daily as needed. 30 tablet 0  ? psyllium (REGULOID) 0.52 g capsule Take 0.52 g by mouth daily. (Patient not taking: Reported on 07/10/2020)    ? sertraline (ZOLOFT) 100 MG tablet Take 2 tablets (200 mg total) by mouth daily. 180 tablet 1  ? ?No current facility-administered medications for this visit.  ? ? ?Medication Side Effects: None ? ?Allergies:  ?Allergies  ?Allergen Reactions  ? Amoxicillin Other (See Comments)  ?  Numbness and tingling at extremities. ? Has patient had a PCN reaction causing  immediate rash, facial/tongue/throat swelling, SOB or lightheadedness with hypotension: Yes (rash) ?Has patient had a PCN reaction causing severe rash involving mucus membranes or skin necrosis: No ?Has patient had a PCN reaction that required hospitalization: No ?Has patient had a PCN reaction occurring within the last 10 years: Yes ?If all of the above answers are "NO", then may proceed with Cephalosporin use. ?  ? ? ?Past Medical History:  ?Diagnosis Date  ? Depression with anxiety   ? Guillain-Barre (HCC) 12/2007  ? Hyperlipemia   ? Migraine   ? Vertigo   ? ? ?Family History  ?Problem Relation Age of Onset  ? Hyperlipidemia Mother   ? Breast cancer Mother   ? Hyperlipidemia Father   ? Melanoma Father   ? Depression Father   ? Anxiety disorder Father   ? Dementia Father   ?  Diabetes Maternal Grandmother   ? Bipolar disorder Niece   ? ADD / ADHD Niece   ? Anxiety disorder Sister   ? Depression Sister   ? Anxiety disorder Half-Sister   ? Depression Half-Sister   ? ADD / ADHD Half-Sister   ? ? ?Social History  ? ?Socioeconomic History  ? Marital status: Single  ?  Spouse name: Not on file  ? Number of children: 0  ? Years of education: college  ? Highest education level: Bachelor's degree (e.g., BA, AB, BS)  ?Occupational History  ? Occupation: Runner, broadcasting/film/video  ?Tobacco Use  ? Smoking status: Never  ? Smokeless tobacco: Never  ?Vaping Use  ? Vaping Use: Never used  ?Substance and Sexual Activity  ? Alcohol use: Yes  ?  Comment: 1-2 drinks per month  ? Drug use: No  ? Sexual activity: Not on file  ?Other Topics Concern  ? Not on file  ?Social History Narrative  ? Lives at home alone.  ? Right-handed.  ? 1 cup coffee per day.  ? ?Social Determinants of Health  ? ?Financial Resource Strain: Not on file  ?Food Insecurity: Not on file  ?Transportation Needs: Not on file  ?Physical Activity: Not on file  ?Stress: Not on file  ?Social Connections: Not on file  ?Intimate Partner Violence: Not on file  ? ? ?Past Medical History,  Surgical history, Social history, and Family history were reviewed and updated as appropriate.  ? ?Please see review of systems for further details on the patient's review from today.  ? ?Objective:  ? ?Physi

## 2021-09-27 ENCOUNTER — Ambulatory Visit (INDEPENDENT_AMBULATORY_CARE_PROVIDER_SITE_OTHER): Payer: BC Managed Care – PPO | Admitting: Psychiatry

## 2021-09-27 DIAGNOSIS — F411 Generalized anxiety disorder: Secondary | ICD-10-CM | POA: Diagnosis not present

## 2021-09-27 NOTE — Progress Notes (Signed)
?      [facility] Counselor/Therapist Progress Note ? ?Patient ID: [name], MRN: [id number],   ? ?Date: 09/27/2021 ? ?Time Spent: 55 minutes start time 4:03 PM end time 4:58 PM ? ?Treatment Type: Individual Therapy ? ?Reported Symptoms: anxiety, frustration, feeling overwhelmed ? ?Mental Status Exam: ? ?Appearance:   Well Groomed     ?Behavior:  Appropriate  ?Motor:  Normal  ?Speech/Language:   Normal Rate  ?Affect:  Appropriate  ?Mood:  normal  ?Thought process:  normal  ?Thought content:    WNL  ?Sensory/Perceptual disturbances:    WNL  ?Orientation:  oriented to person, place, time/date, and situation  ?Attention:  Good  ?Concentration:  Good  ?Memory:  WNL  ?Fund of knowledge:   Good  ?Insight:    Good  ?Judgment:   Good  ?Impulse Control:  Good  ? ?Risk Assessment: ?Danger to Self:  No ?Self-injurious Behavior: No ?Danger to Others: No ?Duty to Warn:no ?Physical Aggression / Violence:No  ?Access to Firearms a concern: No  ?Gang Involvement:No  ? ?Subjective: Patient was present for session.  She shared that things are still very stressful at work.  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 She shared she is focusing on what she can control fix and change. She is doing better at taking deep breaths when she gets overwhelmed.She shared that over all she has been feeling good.  Patient shared that she is realizing she is able to do stuff at school stuff for her mom but she is not able to do what she needs to do for herself and at this point her house is a mess and she feels powerless to get it moving in a positive direction.  Patient did processing set on mess at home, suds level 10, negative cognition "I am lazy" felt shame and embarrassed in chest and shoulders.  Patient was able to reduce suds level to 2.  She was able to realize that she can break things down into manageable pieces and that she does not have to get everything accomplished at 1 time which freed her to  be able to work on what she is doing.  Patient reported feeling much better at the end of session. ? ?Interventions: Cognitive Behavioral Therapy, Solution-Oriented/Positive Psychology, Eye Movement Desensitization and Reprocessing (EMDR), and Insight-Oriented ? ?Diagnosis: ?  ICD-10-CM   ?1. Generalized anxiety disorder  F41.1   ?  ? ? ?Plan:  Patient is to use coping skills to decrease anxiety symptoms.  Patient is to continue working on affirming herself and reminding herself of all that she has been able to overcome and survive.  Patient is to take medication as directed..  Patient is to follow plans from session to break things down at her house into manageable pieces and to give herself breaks in between accomplishing tasks.  Patient is to exercise to release negative emotions appropriately ?Long-term goal: Resolve the core conflict that is a source of anxiety.  Enhance ability to handle effectively the full variety of life's anxieties ?Short-term goal: Identify the major life complex in the past and present the form the basis for present anxiety ? ?Stevphen Meuse, [facility] ? ? ? ? ? ? ? ? ? ? ? ? ? ? ? ? ? ? ?

## 2021-10-11 ENCOUNTER — Ambulatory Visit: Payer: BC Managed Care – PPO | Admitting: Psychiatry

## 2021-10-11 DIAGNOSIS — F411 Generalized anxiety disorder: Secondary | ICD-10-CM | POA: Diagnosis not present

## 2021-10-11 NOTE — Progress Notes (Signed)
?      [facility] Counselor/Therapist Progress Note ? ?Patient ID: [name], MRN: [id number],   ? ?Date: 10/11/2021 ? ?Time Spent: 59 minutes start time 4:00 PM end 4:59 PM ? ?Treatment Type: Individual Therapy ? ?Reported Symptoms: anxiety, sadness, triggered responses, panic attack ? ?Mental Status Exam: ? ?Appearance:   Well Groomed     ?Behavior:  Appropriate  ?Motor:  Normal  ?Speech/Language:   Normal Rate  ?Affect:  Appropriate  ?Mood:  normal  ?Thought process:  normal  ?Thought content:    WNL  ?Sensory/Perceptual disturbances:    WNL  ?Orientation:  oriented to person, place, time/date, and situation  ?Attention:  Good  ?Concentration:  Good  ?Memory:  WNL  ?Fund of knowledge:   Good  ?Insight:    Good  ?Judgment:   Good  ?Impulse Control:  Good  ? ?Risk Assessment: ?Danger to Self:  No ?Self-injurious Behavior: No ?Danger to Others: No ?Duty to Warn:no ?Physical Aggression / Violence:No  ?Access to Firearms a concern: No  ?Gang Involvement:No  ? ?Subjective: Patient was present for session.  She shared that she is feeling anxious about next year and trying to figure out what she is going to do. Patient explained she is getting frustrated due to the kids not focusing and being difficult.  Patient shares she has a few more days that she needs to get through so she wanted to process the loud classroom in session suds level 8, negative cognition "I am not heard" also "nobody listens to me", felt anxiety in her chest shoulders and neck.  Patient was able to reduce suds level to 1.  She was able to recognize that she has to focus on the things that she can control fix and change.  She shared that she has tried very hard to manage things completely on her own and that she needs to call on administration more to manage the out-of-control behaviors.  Patient was also able to think of ways that she can talk herself through managing different situations differently. ? ?Interventions: Cognitive Behavioral  Therapy, Solution-Oriented/Positive Psychology, Eye Movement Desensitization and Reprocessing (EMDR), and Insight-Oriented ? ?Diagnosis: ?  ICD-10-CM   ?1. Generalized anxiety disorder  F41.1   ?  ? ? ?Plan: Patient is to use coping skills to decrease anxiety symptoms.  Patient is to continue working on affirming herself and reminding herself of all that she has been able to overcome and survive.  Patient is to take medication as directed..  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 Patient is to exercise to release negative emotions appropriately ?Long-term goal: Resolve the core conflict that is a source of anxiety.  Enhance ability to handle effectively the full variety of life's anxieties ?Short-term goal: Identify the major life complex in the past and present the form the basis for present anxiety ?  ? ?Stevphen Meuse, [facility] ? ? ? ? ? ? ? ? ? ? ? ? ? ? ? ? ? ? ?

## 2021-10-25 ENCOUNTER — Ambulatory Visit: Payer: BC Managed Care – PPO | Admitting: Psychiatry

## 2021-11-08 ENCOUNTER — Ambulatory Visit: Payer: BC Managed Care – PPO | Admitting: Psychiatry

## 2021-11-28 ENCOUNTER — Ambulatory Visit: Payer: BC Managed Care – PPO | Admitting: Psychiatry

## 2021-11-28 DIAGNOSIS — F411 Generalized anxiety disorder: Secondary | ICD-10-CM

## 2021-11-28 NOTE — Progress Notes (Signed)
      [facility] Counselor/Therapist Progress Note  Patient ID: ERMEL VERNE, MRN: [id number],    Date: 11/28/2021  Time Spent: 50 minutes start time 1:09 PM end time 1:59 PM  Treatment Type: Individual Therapy  Reported Symptoms: anxiety, sleep issues, focusing issues  Mental Status Exam:  Appearance:   Casual and Neat     Behavior:  Appropriate  Motor:  Normal  Speech/Language:   Normal Rate  Affect:  Appropriate  Mood:  anxious  Thought process:  normal  Thought content:    WNL  Sensory/Perceptual disturbances:    WNL  Orientation:  oriented to person, place, time/date, and situation  Attention:  Good  Concentration:  Good  Memory:  WNL  Fund of knowledge:   Good  Insight:    Good  Judgment:   Good  Impulse Control:  Good   Risk Assessment: Danger to Self:  No Self-injurious Behavior: No Danger to Others: No Duty to Warn:no Physical Aggression / Violence:No  Access to Firearms a concern: No  Gang Involvement:No   Subjective: Patient was present for session. She shared she got a new job and she is hopeful that she will have a better year next year.  Discussed that she is still getting overwhelmed and not functioning as well.  Discussed how she has to change her habits and develop a routine that doesn't feel overwhelming to her. She has found a friend to exercise with and she is developing a plan to exercise with her regularly.  Discussed different CBT skills that she can utilize to help her stay focused on her tasks at home.  Patient was encouraged to break things down into manageable pieces and to recognize that when her brain feels that she can do something that that is where she needs to start and then she can build from that as things get better.  Different ways to structure her day to help get into healthy habits to meet the goals that she is wanting to attain were discussed with patient.  Was able to recognize that all the progress that she has made in processing  through EMDR is getting her to the point where she can move forward and is excited about how things are going to progress.  Patient was encouraged to remind herself of that truth regularly and to take things 1 step at a time as she forms new habits and routines in her life.  Interventions: Cognitive Behavioral Therapy and Solution-Oriented/Positive Psychology  Diagnosis:   ICD-10-CM   1. Generalized anxiety disorder  F41.1       Plan: Patient is to use coping skills to decrease anxiety symptoms.  Patient is to continue working on affirming herself and reminding herself of all that she has been able to overcome and survive.  Patient is to take medication as directed..  Patient is to follow plans from session to start developing new habits and routines that are going to make her more effective and functional. Patient is to exercise to release negative emotions appropriately Long-term goal: Resolve the core conflict that is a source of anxiety.  Enhance ability to handle effectively the full variety of life's anxieties Short-term goal: Identify the major life complex in the past and present the form the basis for present anxiety    Stevphen Meuse, [facility]

## 2021-12-18 ENCOUNTER — Telehealth (INDEPENDENT_AMBULATORY_CARE_PROVIDER_SITE_OTHER): Payer: BC Managed Care – PPO | Admitting: Psychiatry

## 2021-12-18 ENCOUNTER — Encounter: Payer: Self-pay | Admitting: Psychiatry

## 2021-12-18 DIAGNOSIS — F5081 Binge eating disorder: Secondary | ICD-10-CM

## 2021-12-18 DIAGNOSIS — F900 Attention-deficit hyperactivity disorder, predominantly inattentive type: Secondary | ICD-10-CM | POA: Diagnosis not present

## 2021-12-18 DIAGNOSIS — F411 Generalized anxiety disorder: Secondary | ICD-10-CM

## 2021-12-18 DIAGNOSIS — F3341 Major depressive disorder, recurrent, in partial remission: Secondary | ICD-10-CM | POA: Diagnosis not present

## 2021-12-18 MED ORDER — LISDEXAMFETAMINE DIMESYLATE 40 MG PO CAPS: 40.0000 mg | ORAL_CAPSULE | ORAL | 0 refills | Status: DC

## 2021-12-18 MED ORDER — AMPHETAMINE-DEXTROAMPHETAMINE 10 MG PO TABS: ORAL_TABLET | ORAL | 0 refills | Status: DC

## 2021-12-18 MED ORDER — SERTRALINE HCL 100 MG PO TABS: 200.0000 mg | ORAL_TABLET | Freq: Every day | ORAL | 1 refills | Status: DC

## 2021-12-18 MED ORDER — LORAZEPAM 0.5 MG PO TABS: 0.5000 mg | ORAL_TABLET | Freq: Two times a day (BID) | ORAL | 0 refills | Status: DC | PRN

## 2021-12-18 MED ORDER — BUPROPION HCL ER (XL) 300 MG PO TB24: 300.0000 mg | ORAL_TABLET | Freq: Every day | ORAL | 1 refills | Status: DC

## 2021-12-18 NOTE — Progress Notes (Signed)
[name] [id number] [birth year] 36 y.o.  Virtual Visit via Video Note  I connected with pt @ on 12/18/21 at  4:00 PM EDT by a video enabled telemedicine application and verified that I am speaking with the correct person using two identifiers.   I discussed the limitations of evaluation and management by telemedicine and the availability of in person appointments. The patient expressed understanding and agreed to proceed.  I discussed the assessment and treatment plan with the patient. The patient was provided an opportunity to ask questions and all were answered. The patient agreed with the plan and demonstrated an understanding of the instructions.   The patient was advised to call back or seek an in-person evaluation if the symptoms worsen or if the condition fails to improve as anticipated.  I provided 30 minutes of non-face-to-face time during this encounter.  The patient was located at home.  The provider was located at [facility].   Corie Chiquito, PMHNP   Subjective:   Patient ID:  [name] is a 36 y.o. (DOB 1985-11-29) female.  Chief Complaint:  Chief Complaint  Patient presents with   Follow-up    ADHD, anxiety, depression    HPI [name] presents for follow-up of anxiety, depression, and ADHD. She reports that EMDR has been helpful and feels like she is in "a better frame of mind." She reports that she is better able to identify triggers. She reports that her anxiety has been well controlled over the summer without any panic or stress headaches. She reports some catastrophic thinking at times and is better able to redirect this. Continues to notice some worry. Anxiety has kept her from socializing in the past. She has been going on some dates and getting together with friends. Denies sad mood. She reports that she will occasionally feel overwhelmed when thinking about different things she needs to do and not knowing where to start. She  reports that Vyvanse has been helpful at work and she was productive and meeting deadlines. She reports that she notices some procrastination at home. Sleeping well. Energy has been ok. Not needing naps. Motivation has been ok overall. Not as motivated to do certain projects at home as she would like. Appetite has been "steady." Has been eating healthier. Not binge eating. Denies SI.   "I feel the best I have felt in awhile."  Has not needed Lorazepam prn recently.   She will be starting a new job on 12/25/21. 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 Decided to hold off on starting graduate school.   Past Psychiatric Medication Trials: Sertraline- Has taken since 2020. Has helped with depression. Initially seemed to help Lexapro- Took in 2015. Increased up to 20 mg. Took for a couple of years. Reports that it was helpful and may have stopped working. Effexor- Prescribed for Vestibular migraines Wellbutrin-Denies side effects. Started in July. Took Wellbutrin XL 300 mg in the past Abilify- Seemed to be effective initially. Ativan- Took as monotherapy in the past and "was a zombie." Clonidine- Ineffective Propranolol-Ineffective Topamax  Review of Systems:  Review of Systems  Cardiovascular:  Negative for palpitations.  Musculoskeletal:  Negative for gait problem.  Neurological:  Negative for tremors and headaches.  Psychiatric/Behavioral:         Please refer to HPI    Medications: I have reviewed the patient's current medications.  Current Outpatient Medications  Medication Sig Dispense Refill   amphetamine-dextroamphetamine (ADDERALL) 10 MG  tablet Take 1/2-1 tab po qd 30 tablet 0   [START ON 01/15/2022] amphetamine-dextroamphetamine (ADDERALL) 10 MG tablet Take 1/2-1 tab po qd 30 tablet 0   [START ON 02/12/2022] amphetamine-dextroamphetamine (ADDERALL) 10 MG tablet Take 1/2-1 tab po qd 30 tablet 0   atorvastatin (LIPITOR) 20 MG tablet Take 20 mg by  mouth daily.     cetirizine (ZYRTEC) 10 MG tablet Take 10 mg by mouth daily.     Cholecalciferol (VITAMIN D3) 125 MCG (5000 UT) CAPS Take by mouth.     fenofibrate 54 MG tablet Take 108 mg by mouth 2 (two) times daily.     MELATONIN PO Take by mouth at bedtime as needed.     Multiple Vitamin (MULTIVITAMIN WITH MINERALS) TABS tablet Take 1 tablet by mouth daily.     Probiotic Product (PROBIOTIC PO) Take 1 tablet by mouth daily.      promethazine (PHENERGAN) 12.5 MG tablet Take 12.5 mg by mouth every 6 (six) hours as needed. for nausea     psyllium (REGULOID) 0.52 g capsule Take 0.52 g by mouth daily.     Rimegepant Sulfate (NURTEC) 75 MG TBDP Take by mouth daily as needed.     tiZANidine (ZANAFLEX) 4 MG tablet Take 1 tablet (4 mg total) by mouth every 6 (six) hours as needed for muscle spasms. 30 tablet 6   buPROPion (WELLBUTRIN XL) 300 MG 24 hr tablet Take 1 tablet (300 mg total) by mouth daily. 90 tablet 1   [START ON 02/12/2022] lisdexamfetamine (VYVANSE) 40 MG capsule Take 1 capsule (40 mg total) by mouth every morning. 30 capsule 0   [START ON 01/15/2022] lisdexamfetamine (VYVANSE) 40 MG capsule Take 1 capsule (40 mg total) by mouth every morning. 30 capsule 0   lisdexamfetamine (VYVANSE) 40 MG capsule Take 1 capsule (40 mg total) by mouth every morning. 30 capsule 0   LORazepam (ATIVAN) 0.5 MG tablet Take 1 tablet (0.5 mg total) by mouth 2 (two) times daily as needed. 30 tablet 0   sertraline (ZOLOFT) 100 MG tablet Take 2 tablets (200 mg total) by mouth daily. 180 tablet 1   zolmitriptan (ZOMIG) 5 MG tablet Take 5 mg by mouth 2 (two) times daily as needed for migraine.     No current facility-administered medications for this visit.    Medication Side Effects: None  Allergies:  Allergies  Allergen Reactions   Amoxicillin Other (See Comments)    Numbness and tingling at extremities.  Has patient had a PCN reaction causing immediate rash, facial/tongue/throat swelling, SOB or  lightheadedness with hypotension: Yes (rash) Has patient had a PCN reaction causing severe rash involving mucus membranes or skin necrosis: No Has patient had a PCN reaction that required hospitalization: No Has patient had a PCN reaction occurring within the last 10 years: Yes If all of the above answers are "NO", then may proceed with Cephalosporin use.     Past Medical History:  Diagnosis Date   Depression with anxiety    Guillain-Barre (HCC) 12/2007   Hyperlipemia    Migraine    Vertigo     Family History  Problem Relation Age of Onset   Hyperlipidemia Mother    Breast cancer Mother    Hyperlipidemia Father    Melanoma Father    Depression Father    Anxiety disorder Father    Dementia Father    Diabetes Maternal Grandmother    Bipolar disorder Niece    ADD / ADHD Niece    Anxiety disorder  Sister    Depression Sister    Anxiety disorder Half-Sister    Depression Half-Sister    ADD / ADHD Half-Sister     Social History   Socioeconomic History   Marital status: Single    Spouse name: Not on file   Number of children: 0   Years of education: college   Highest education level: Bachelor's degree (e.g., BA, AB, BS)  Occupational History   Occupation: Teacher  Tobacco Use   Smoking status: Never   Smokeless tobacco: Never  Vaping Use   Vaping Use: Never used  Substance and Sexual Activity   Alcohol use: Yes    Comment: 1-2 drinks per month   Drug use: No   Sexual activity: Not on file  Other Topics Concern   Not on file  Social History Narrative   Lives at home alone.   Right-handed.   1 cup coffee per day.   Social Determinants of Health   Financial Resource Strain: Not on file  Food Insecurity: Not on file  Transportation Needs: Not on file  Physical Activity: Not on file  Stress: Not on file  Social Connections: Not on file  Intimate Partner Violence: Not on file    Past Medical History, Surgical history, Social history, and Family history were  reviewed and updated as appropriate.   Please see review of systems for further details on the patient's review from today.   Objective:   Physical Exam:  BP 130/80   Physical Exam Constitutional:      General: She is not in acute distress. Musculoskeletal:        General: No deformity.  Neurological:     Mental Status: She is alert and oriented to person, place, and time.     Coordination: Coordination normal.  Psychiatric:        Attention and Perception: Attention and perception normal. She does not perceive auditory or visual hallucinations.        Mood and Affect: Mood normal. Mood is not anxious or depressed. Affect is not labile, blunt, angry or inappropriate.        Speech: Speech normal.        Behavior: Behavior normal.        Thought Content: Thought content normal. Thought content is not paranoid or delusional. Thought content does not include homicidal or suicidal ideation. Thought content does not include homicidal or suicidal plan.        Cognition and Memory: Cognition and memory normal.        Judgment: Judgment normal.     Comments: Insight intact     Lab Review:     Component Value Date/Time   NA 141 11/19/2019 1422   K 3.9 11/19/2019 1422   CL 108 11/19/2019 1422   CO2 20 (L) 11/19/2019 1422   GLUCOSE 99 11/19/2019 1422   BUN 12 11/19/2019 1422   CREATININE 0.82 11/19/2019 1422   CALCIUM 9.6 11/19/2019 1422   PROT 8.1 11/19/2019 1422   ALBUMIN 4.2 11/19/2019 1422   AST 29 11/19/2019 1422   ALT 42 11/19/2019 1422   ALKPHOS 62 11/19/2019 1422   BILITOT 0.4 11/19/2019 1422   GFRNONAA >60 11/19/2019 1422   GFRAA >60 11/19/2019 1422       Component Value Date/Time   WBC 10.3 11/19/2019 1422   WBC 11.0 (H) 11/26/2017 1730   RBC 4.53 11/19/2019 1423   RBC 4.58 11/19/2019 1422   HGB 13.4 11/19/2019 1422   HCT 39.5 11/19/2019  1422   PLT 320 11/19/2019 1422   MCV 86.2 11/19/2019 1422   MCH 29.3 11/19/2019 1422   MCHC 33.9 11/19/2019 1422   RDW  13.8 11/19/2019 1422   LYMPHSABS 2.5 11/19/2019 1422   MONOABS 0.7 11/19/2019 1422   EOSABS 0.2 11/19/2019 1422   BASOSABS 0.0 11/19/2019 1422    No results found for: "POCLITH", "LITHIUM"   No results found for: "PHENYTOIN", "PHENOBARB", "VALPROATE", "CBMZ"   .res Assessment: Plan:    Pt seen for 30 minutes and time spent discussing potential benefits, risks, and side effects of Adderall IR. Discussed that Adderall could be added to Vyvanse to extend the duration since her work day will start early in the fall and continue into the evening.  Continue Vyvanse 40 mg po qd for ADHD.  Continue Adderall 10 mg po qd prn.  Continue Sertraline 200 mg po qd for anxiety and depression.  Continue Wellbutrin XL 300 mg po qd for depression.  Continue Lorazepam 0.5 mg po BID prn anxiety.  Pt to follow-up in 3 months or sooner if clinically indicated.  Patient advised to contact office with any questions, adverse effects, or acute worsening in signs and symptoms.   [name] was seen today for follow-up.  Diagnoses and all orders for this visit:  Generalized anxiety disorder -     LORazepam (ATIVAN) 0.5 MG tablet; Take 1 tablet (0.5 mg total) by mouth 2 (two) times daily as needed. -     sertraline (ZOLOFT) 100 MG tablet; Take 2 tablets (200 mg total) by mouth daily.  Attention deficit hyperactivity disorder (ADHD), predominantly inattentive type -     lisdexamfetamine (VYVANSE) 40 MG capsule; Take 1 capsule (40 mg total) by mouth every morning. -     lisdexamfetamine (VYVANSE) 40 MG capsule; Take 1 capsule (40 mg total) by mouth every morning. -     lisdexamfetamine (VYVANSE) 40 MG capsule; Take 1 capsule (40 mg total) by mouth every morning. -     amphetamine-dextroamphetamine (ADDERALL) 10 MG tablet; Take 1/2-1 tab po qd -     amphetamine-dextroamphetamine (ADDERALL) 10 MG tablet; Take 1/2-1 tab po qd -     amphetamine-dextroamphetamine (ADDERALL) 10 MG tablet; Take 1/2-1 tab po qd  Binge  eating disorder -     lisdexamfetamine (VYVANSE) 40 MG capsule; Take 1 capsule (40 mg total) by mouth every morning.  Recurrent major depressive disorder, in partial remission (HCC) -     buPROPion (WELLBUTRIN XL) 300 MG 24 hr tablet; Take 1 tablet (300 mg total) by mouth daily. -     sertraline (ZOLOFT) 100 MG tablet; Take 2 tablets (200 mg total) by mouth daily.     Please see After Visit Summary for patient specific instructions.  Future Appointments  Date Time Provider Department Center  01/08/2022 10:00 AM Stevphen Meuse, [facility] CP-CP None    No orders of the defined types were placed in this encounter.     -------------------------------

## 2021-12-19 ENCOUNTER — Ambulatory Visit: Payer: BC Managed Care – PPO | Admitting: Psychiatry

## 2021-12-19 ENCOUNTER — Telehealth: Payer: Self-pay

## 2021-12-19 NOTE — Telephone Encounter (Addendum)
Prior Authorization initiated Amphetamine-Dextroamphetamine 10MG  tablets 1/2 to 1 tab qd [employer] ID:   Approval received  As long as you remain covered by the [facility] and there are no changes to your plan benefits, this request is approved for the following time period: 12/20/2021 - 12/20/2024

## 2022-01-08 ENCOUNTER — Ambulatory Visit: Payer: BC Managed Care – PPO | Admitting: Psychiatry

## 2022-01-09 ENCOUNTER — Encounter (INDEPENDENT_AMBULATORY_CARE_PROVIDER_SITE_OTHER): Payer: Self-pay

## 2022-02-19 ENCOUNTER — Ambulatory Visit: Payer: BC Managed Care – PPO | Admitting: Psychiatry

## 2022-03-18 ENCOUNTER — Ambulatory Visit (INDEPENDENT_AMBULATORY_CARE_PROVIDER_SITE_OTHER): Payer: BC Managed Care – PPO | Admitting: Psychiatry

## 2022-03-18 ENCOUNTER — Encounter: Payer: Self-pay | Admitting: Psychiatry

## 2022-03-18 DIAGNOSIS — Z713 Dietary counseling and surveillance: Secondary | ICD-10-CM | POA: Diagnosis not present

## 2022-03-18 DIAGNOSIS — Z6834 Body mass index (BMI) 34.0-34.9, adult: Secondary | ICD-10-CM | POA: Diagnosis not present

## 2022-03-18 DIAGNOSIS — G43009 Migraine without aura, not intractable, without status migrainosus: Secondary | ICD-10-CM | POA: Diagnosis not present

## 2022-03-18 DIAGNOSIS — F3341 Major depressive disorder, recurrent, in partial remission: Secondary | ICD-10-CM | POA: Diagnosis not present

## 2022-03-18 DIAGNOSIS — E669 Obesity, unspecified: Secondary | ICD-10-CM | POA: Diagnosis not present

## 2022-03-18 DIAGNOSIS — F411 Generalized anxiety disorder: Secondary | ICD-10-CM | POA: Diagnosis not present

## 2022-03-18 DIAGNOSIS — F900 Attention-deficit hyperactivity disorder, predominantly inattentive type: Secondary | ICD-10-CM

## 2022-03-18 DIAGNOSIS — F5081 Binge eating disorder: Secondary | ICD-10-CM | POA: Diagnosis not present

## 2022-03-18 DIAGNOSIS — R7303 Prediabetes: Secondary | ICD-10-CM | POA: Diagnosis not present

## 2022-03-18 MED ORDER — LISDEXAMFETAMINE DIMESYLATE 40 MG PO CAPS: 40.0000 mg | ORAL_CAPSULE | ORAL | 0 refills | Status: DC

## 2022-03-18 MED ORDER — SERTRALINE HCL 100 MG PO TABS: 200.0000 mg | ORAL_TABLET | Freq: Every day | ORAL | 0 refills | Status: DC

## 2022-03-18 MED ORDER — BUPROPION HCL ER (XL) 300 MG PO TB24: 300.0000 mg | ORAL_TABLET | Freq: Every day | ORAL | 1 refills | Status: DC

## 2022-03-18 NOTE — Progress Notes (Signed)
[name] [id number] [birth year] 36 y.o.  Subjective:   Patient ID:  [name] is a 36 y.o. (DOB 1986/04/18) female.  Chief Complaint:  Chief Complaint  Patient presents with   Follow-up    Anxiety, depression, ADHD    HPI [name] presents to the office today for follow-up of anxiety, depression, and ADHD. She is enjoying her new job at [employer]. She has been getting to work on time or early. She reports that her anxiety has been "a lot better." Denies any severe panic attacks. She reports some chronic, low-level anxiety. She reports some occ tightness in her chest and muscle tension. She reports feeling safer in her current work environment. "I'm not always on guard."  She reports that she has not had as many migraines and tension headaches. She reports that she feels "a productive tired." She reports that it is easier for her to get up in the morning. Getting up at 5 am and setting alarms for different deadlines with getting out the door. She reports experiences little to no depression at this time. She reports that she is trying to be pro-active about seasonal depression. She has been going to the gym regularly with a friend, increasing her Vit D, and having some social/enjoyable time. Motivation is different at work versus at home. Planning period is broken up and this is difficult for her. She reports that she is breaking up tasks into 30 minute intervals and taking a 10 minute break. She reports that her motivation has improved some. She reports taking Vyvanse and Adderall has been helping. She has been taking Adderall in the mornings most days. Appetite has been ok. Denies binge eating. [facility] and has not been able to obtain it. She has been eating out less and prepping meals more. Denies SI.   Has not needed Ativan prn recently.   Vyvanse last filled 03/04/22. Adderall last filled 12/19/21.   Adderall   Past Psychiatric Medication Trials: Sertraline-  Has taken since 2020. Has helped with depression. Initially seemed to help Lexapro- Took in 2015. Increased up to 20 mg. Took for a couple of years. Reports that it was helpful and may have stopped working. Effexor- Prescribed for Vestibular migraines Wellbutrin-Denies side effects. Started in July. Took Wellbutrin XL 300 mg in the past Abilify- Seemed to be effective initially. Ativan- Took as monotherapy in the past and "was a zombie." Clonidine- Ineffective Propranolol-Ineffective Topamax  AIMS    [place] Office Visit from 09/12/2020 in [place] Visit from 07/10/2020 in [place] Total Score 1 0        Review of Systems:  Review of Systems  Musculoskeletal:  Negative for gait problem.  Neurological:        Reduced headaches and migraines  Psychiatric/Behavioral:         Please refer to HPI    Medications: I have reviewed the patient's current medications.  Current Outpatient Medications  Medication Sig Dispense Refill   amphetamine-dextroamphetamine (ADDERALL) 10 MG tablet Take 1/2-1 tab po qd 30 tablet 0   atorvastatin (LIPITOR) 20 MG tablet Take 20 mg by mouth daily.     cetirizine (ZYRTEC) 10 MG tablet Take 10 mg by mouth daily.     Cholecalciferol (VITAMIN D3) 125 MCG (5000 UT) CAPS Take by mouth.     fenofibrate 54 MG tablet Take 108 mg by mouth daily.     Multiple Vitamin (MULTIVITAMIN WITH MINERALS) TABS  tablet Take 1 tablet by mouth daily.     Probiotic Product (PROBIOTIC PO) Take 1 tablet by mouth daily.      promethazine (PHENERGAN) 12.5 MG tablet Take 12.5 mg by mouth every 6 (six) hours as needed. for nausea     psyllium (REGULOID) 0.52 g capsule Take 0.52 g by mouth daily.     Rimegepant Sulfate (NURTEC) 75 MG TBDP Take by mouth daily as needed.     tiZANidine (ZANAFLEX) 4 MG tablet Take 1 tablet (4 mg total) by mouth every 6 (six) hours as needed for muscle spasms. 30 tablet 6   zolmitriptan (ZOMIG) 5 MG  tablet Take 5 mg by mouth 2 (two) times daily as needed for migraine.     amphetamine-dextroamphetamine (ADDERALL) 10 MG tablet Take 1/2-1 tab po qd 30 tablet 0   amphetamine-dextroamphetamine (ADDERALL) 10 MG tablet Take 1/2-1 tab po qd 30 tablet 0   buPROPion (WELLBUTRIN XL) 300 MG 24 hr tablet Take 1 tablet (300 mg total) by mouth daily. 90 tablet 1   [START ON 05/27/2022] lisdexamfetamine (VYVANSE) 40 MG capsule Take 1 capsule (40 mg total) by mouth every morning. 30 capsule 0   [START ON 04/29/2022] lisdexamfetamine (VYVANSE) 40 MG capsule Take 1 capsule (40 mg total) by mouth every morning. 30 capsule 0   [START ON 04/01/2022] lisdexamfetamine (VYVANSE) 40 MG capsule Take 1 capsule (40 mg total) by mouth every morning. 30 capsule 0   LORazepam (ATIVAN) 0.5 MG tablet Take 1 tablet (0.5 mg total) by mouth 2 (two) times daily as needed. 30 tablet 0   MELATONIN PO Take by mouth at bedtime as needed.     sertraline (ZOLOFT) 100 MG tablet Take 2 tablets (200 mg total) by mouth daily. 180 tablet 0   No current facility-administered medications for this visit.    Medication Side Effects: None  Allergies:  Allergies  Allergen Reactions   Amoxicillin Other (See Comments)    Numbness and tingling at extremities.  Has patient had a PCN reaction causing immediate rash, facial/tongue/throat swelling, SOB or lightheadedness with hypotension: Yes (rash) Has patient had a PCN reaction causing severe rash involving mucus membranes or skin necrosis: No Has patient had a PCN reaction that required hospitalization: No Has patient had a PCN reaction occurring within the last 10 years: Yes If all of the above answers are "NO", then may proceed with Cephalosporin use.     Past Medical History:  Diagnosis Date   Depression with anxiety    Guillain-Barre (HCC) 12/2007   Hyperlipemia    Migraine    Vertigo     Past Medical History, Surgical history, Social history, and Family history were reviewed  and updated as appropriate.   Please see review of systems for further details on the patient's review from today.   Objective:   Physical Exam:  BP 134/81   Pulse 83   Physical Exam Constitutional:      General: She is not in acute distress. Musculoskeletal:        General: No deformity.  Neurological:     Mental Status: She is alert and oriented to person, place, and time.     Coordination: Coordination normal.  Psychiatric:        Attention and Perception: Attention and perception normal. She does not perceive auditory or visual hallucinations.        Mood and Affect: Mood normal. Mood is not anxious or depressed. Affect is not labile, blunt, angry or inappropriate.  Speech: Speech normal.        Behavior: Behavior normal.        Thought Content: Thought content normal. Thought content is not paranoid or delusional. Thought content does not include homicidal or suicidal ideation. Thought content does not include homicidal or suicidal plan.        Cognition and Memory: Cognition and memory normal.        Judgment: Judgment normal.     Comments: Insight intact     Lab Review:     Component Value Date/Time   NA 141 11/19/2019 1422   K 3.9 11/19/2019 1422   CL 108 11/19/2019 1422   CO2 20 (L) 11/19/2019 1422   GLUCOSE 99 11/19/2019 1422   BUN 12 11/19/2019 1422   CREATININE 0.82 11/19/2019 1422   CALCIUM 9.6 11/19/2019 1422   PROT 8.1 11/19/2019 1422   ALBUMIN 4.2 11/19/2019 1422   AST 29 11/19/2019 1422   ALT 42 11/19/2019 1422   ALKPHOS 62 11/19/2019 1422   BILITOT 0.4 11/19/2019 1422   GFRNONAA >60 11/19/2019 1422   GFRAA >60 11/19/2019 1422       Component Value Date/Time   WBC 10.3 11/19/2019 1422   WBC 11.0 (H) 11/26/2017 1730   RBC 4.53 11/19/2019 1423   RBC 4.58 11/19/2019 1422   HGB 13.4 11/19/2019 1422   HCT 39.5 11/19/2019 1422   PLT 320 11/19/2019 1422   MCV 86.2 11/19/2019 1422   MCH 29.3 11/19/2019 1422   MCHC 33.9 11/19/2019 1422    RDW 13.8 11/19/2019 1422   LYMPHSABS 2.5 11/19/2019 1422   MONOABS 0.7 11/19/2019 1422   EOSABS 0.2 11/19/2019 1422   BASOSABS 0.0 11/19/2019 1422    No results found for: "POCLITH", "LITHIUM"   No results found for: "PHENYTOIN", "PHENOBARB", "VALPROATE", "CBMZ"   .res Assessment: Plan:   Will continue current plan of care since target signs and symptoms are well controlled without any tolerability issues. Continue Vyvanse 40 mg po qd for ADHD and binge eating disorder.  Continue Adderall 10 mg 1/2-1 tab po qd prn ADHD.  Continue Sertraline 200 mg daily for anxiety and depression.  Continue Wellbutrin XL 300mg  po qd for depression.  Continue Lorazepam 0.5 mg po BID prn anxiety. She continues to have refills on file.  Recommend continuing therapy with , [facility].  Pt to follow-up in 3-4 months or sooner if clinically indicated.  Patient advised to contact office with any questions, adverse effects, or acute worsening in signs and symptoms.   Raechel was seen today for follow-up.  Diagnoses and all orders for this visit:  Recurrent major depressive disorder, in partial remission (HCC) -     buPROPion (WELLBUTRIN XL) 300 MG 24 hr tablet; Take 1 tablet (300 mg total) by mouth daily. -     sertraline (ZOLOFT) 100 MG tablet; Take 2 tablets (200 mg total) by mouth daily.  Attention deficit hyperactivity disorder (ADHD), predominantly inattentive type -     lisdexamfetamine (VYVANSE) 40 MG capsule; Take 1 capsule (40 mg total) by mouth every morning. -     lisdexamfetamine (VYVANSE) 40 MG capsule; Take 1 capsule (40 mg total) by mouth every morning. -     lisdexamfetamine (VYVANSE) 40 MG capsule; Take 1 capsule (40 mg total) by mouth every morning.  Binge eating disorder -     lisdexamfetamine (VYVANSE) 40 MG capsule; Take 1 capsule (40 mg total) by mouth every morning.  Generalized anxiety disorder -  sertraline (ZOLOFT) 100 MG tablet; Take 2 tablets (200 mg total) by  mouth daily.     Please see After Visit Summary for patient specific instructions.  Future Appointments  Date Time Provider Department Center  03/28/2022  5:00 PM Stevphen Meuse, [facility] CP-CP None  07/22/2022  4:00 PM Corie Chiquito, PMHNP CP-CP None    No orders of the defined types were placed in this encounter.   -------------------------------

## 2022-03-19 ENCOUNTER — Ambulatory Visit: Payer: BC Managed Care – PPO | Admitting: Psychiatry

## 2022-03-28 ENCOUNTER — Ambulatory Visit (INDEPENDENT_AMBULATORY_CARE_PROVIDER_SITE_OTHER): Payer: BC Managed Care – PPO | Admitting: Psychiatry

## 2022-03-28 DIAGNOSIS — F411 Generalized anxiety disorder: Secondary | ICD-10-CM

## 2022-03-28 NOTE — Progress Notes (Signed)
[facility] Counselor/Therapist Progress Note  Patient ID: [name], MRN: [id number],    Date: 03/28/2022  Time Spent: 50 minutes start time 5:00 PM end time 5:50 PM  Treatment Type: Individual Therapy  Reported Symptoms: anxiety, sadness, triggered responses, grief issues, crying spell, panic  Mental Status Exam:  Appearance:   Well Groomed     Behavior:  Appropriate  Motor:  Normal  Speech/Language:   Normal Rate  Affect:  Appropriate  Mood:  anxious  Thought process:  normal  Thought content:    WNL  Sensory/Perceptual disturbances:    WNL  Orientation:  oriented to person, place, time/date, and situation  Attention:  Good  Concentration:  Good  Memory:  WNL  Fund of knowledge:   Good  Insight:    Good  Judgment:   Good  Impulse Control:  Good   Risk Assessment: Danger to Self:  No Self-injurious Behavior: No Danger to Others: No Duty to Warn:no Physical Aggression / Violence:No  Access to Firearms a concern: No  Gang Involvement:No   Subjective: Patient was present for session. 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  She went on to share that the school administration was supportive and things that need to change at the school will be addressed.  She also shared that she is having to help find a nursing home for her father and that is very sadn and stressful.  Discussed the situation that happened at school when she had a meltdown.  Patient was able to recognize that it was time to feeling not heard which was her situation at her last job.  She shared she was feeling really good about everything that happened because she was able to process it with her students and with staff at the school.  She shared they would be having a meeting in the morning to figure out how to make the situation better.  Patient reported she also recognize that the behaviors of the children were not as extreme as her last job  and she has to keep things in perspective.  Patient was encouraged to write out the positive changes in her new situation.  Patient stated she is doing better with trying to stay on top of things and not to get overwhelmed.  Other ways to help her cut down on her anxiety to prepare for her work week was just discussed with patient and plans were developed.  Patient shared the situation with her parents and how she is struggling with knowing what to do with her father and mother because they are both having memory issues.  Ways for her to take care of herself as well as take care of them were discussed with patient as well.  Interventions: Solution-Oriented/Positive Psychology  Diagnosis:   ICD-10-CM   1. Generalized anxiety disorder  F41.1       Plan: Patient is to use coping skills to decrease anxiety symptoms.  Patient is to continue working on affirming herself and reminding herself of all that she has been able to overcome and survive.  Patient is to take medication as directed..  Patient is to follow plans from session to start developing new habits and routines that are going to make her more effective and functional. Patient is to exercise to release negative emotions appropriately Long-term goal: Resolve the core conflict that is a source of anxiety.  Enhance ability to handle  effectively the full variety of life's anxieties Short-term goal: Identify the major life complex in the past and present the form the basis for present anxiety    Lina Sayre, [facility]

## 2022-05-16 ENCOUNTER — Telehealth: Payer: Self-pay | Admitting: Psychiatry

## 2022-05-16 NOTE — Telephone Encounter (Signed)
[name] called this morning at 10:10 to report that [facility]. I did ask her to call them back and ask them if they knew who may have it.  [name] asked if there was perhaps another medication she could use instead.  I did inform her that others medications where also hard to find, but that I send her message about possibly trying something else.  She does have her Adderall 10mg , but is just a booster and not enough to help.

## 2022-05-16 NOTE — Telephone Encounter (Signed)
LVM informing pt to try [facility] and [place] [facility].Also suggested seeing if 20 mg is available.

## 2022-05-20 ENCOUNTER — Telehealth: Payer: Self-pay | Admitting: Psychiatry

## 2022-05-20 DIAGNOSIS — F900 Attention-deficit hyperactivity disorder, predominantly inattentive type: Secondary | ICD-10-CM

## 2022-05-20 MED ORDER — LISDEXAMFETAMINE DIMESYLATE 40 MG PO CHEW: 40.0000 mg | CHEWABLE_TABLET | Freq: Every day | ORAL | 0 refills | Status: DC

## 2022-05-20 NOTE — Telephone Encounter (Signed)
Please review

## 2022-05-20 NOTE — Telephone Encounter (Signed)
Patient lvm stating she can't find Vyvanse anywhere. [facility] stated [facility] [address] [facility]. Can we use this instead. Please advise.  See telephone encounter on 12/14. Next appointment scheduled for 07/22/22

## 2022-05-20 NOTE — Telephone Encounter (Signed)
Script sent  

## 2022-05-22 ENCOUNTER — Other Ambulatory Visit (HOSPITAL_COMMUNITY): Payer: Self-pay

## 2022-05-22 ENCOUNTER — Telehealth: Payer: Self-pay | Admitting: Psychiatry

## 2022-05-22 ENCOUNTER — Ambulatory Visit: Payer: BC Managed Care – PPO | Admitting: Psychiatry

## 2022-05-22 ENCOUNTER — Other Ambulatory Visit: Payer: Self-pay

## 2022-05-22 DIAGNOSIS — F900 Attention-deficit hyperactivity disorder, predominantly inattentive type: Secondary | ICD-10-CM

## 2022-05-22 MED ORDER — LISDEXAMFETAMINE DIMESYLATE 40 MG PO CAPS
40.0000 mg | ORAL_CAPSULE | ORAL | 0 refills | Status: DC
  Filled 2022-05-22: qty 30, 30d supply, fill #0

## 2022-05-22 NOTE — Telephone Encounter (Signed)
Pended.

## 2022-05-22 NOTE — Telephone Encounter (Signed)
[name] called at 9:30 and requested that her prescription for Vyvanse be transferred to [facility] because they have the medication in her dose in brand and generic.  Other pharmacy doesn't have it.

## 2022-05-23 ENCOUNTER — Other Ambulatory Visit (HOSPITAL_BASED_OUTPATIENT_CLINIC_OR_DEPARTMENT_OTHER): Payer: Self-pay

## 2022-05-23 DIAGNOSIS — J0140 Acute pansinusitis, unspecified: Secondary | ICD-10-CM | POA: Diagnosis not present

## 2022-05-23 DIAGNOSIS — R051 Acute cough: Secondary | ICD-10-CM | POA: Diagnosis not present

## 2022-05-24 ENCOUNTER — Other Ambulatory Visit (HOSPITAL_BASED_OUTPATIENT_CLINIC_OR_DEPARTMENT_OTHER): Payer: Self-pay

## 2022-05-24 DIAGNOSIS — R051 Acute cough: Secondary | ICD-10-CM | POA: Diagnosis not present

## 2022-05-24 MED ORDER — WEGOVY 1 MG/0.5ML ~~LOC~~ SOAJ
1.0000 mg | SUBCUTANEOUS | 0 refills | Status: DC
  Filled 2022-05-24 – 2022-06-13 (×4): qty 2, 28d supply, fill #0

## 2022-05-29 ENCOUNTER — Emergency Department (HOSPITAL_BASED_OUTPATIENT_CLINIC_OR_DEPARTMENT_OTHER)
Admission: EM | Admit: 2022-05-29 | Discharge: 2022-05-29 | Discharge status: Against Medical Advice | Payer: BC Managed Care – PPO | Attending: Emergency Medicine | Admitting: Emergency Medicine

## 2022-05-29 ENCOUNTER — Other Ambulatory Visit: Payer: Self-pay

## 2022-05-29 ENCOUNTER — Encounter (HOSPITAL_BASED_OUTPATIENT_CLINIC_OR_DEPARTMENT_OTHER): Payer: Self-pay

## 2022-05-29 DIAGNOSIS — Z5321 Procedure and treatment not carried out due to patient leaving prior to being seen by health care provider: Secondary | ICD-10-CM | POA: Diagnosis not present

## 2022-05-29 DIAGNOSIS — Z1152 Encounter for screening for COVID-19: Secondary | ICD-10-CM | POA: Diagnosis not present

## 2022-05-29 DIAGNOSIS — G43909 Migraine, unspecified, not intractable, without status migrainosus: Secondary | ICD-10-CM | POA: Insufficient documentation

## 2022-05-29 LAB — RESP PANEL BY RT-PCR (RSV, FLU A&B, COVID)  RVPGX2
Influenza A by PCR: NEGATIVE
Influenza B by PCR: NEGATIVE
Resp Syncytial Virus by PCR: NEGATIVE
SARS Coronavirus 2 by RT PCR: NEGATIVE

## 2022-05-29 NOTE — ED Triage Notes (Signed)
Patient presents from home, states she was out to eat when she broke into a cold sweat but her extremities were very cold. States she believes she is having an alx rx to the abx she's taking. States she is allergic to amoxicillin but is taking an abx that starts with CEF, she does not know the full name. States she took zolitriptan, and zofran ODT this AM. Patient aaxo4, ambulatory. CC of migraine x3 days, tingling in her hands and lips.

## 2022-06-06 ENCOUNTER — Other Ambulatory Visit (HOSPITAL_BASED_OUTPATIENT_CLINIC_OR_DEPARTMENT_OTHER): Payer: Self-pay

## 2022-06-10 ENCOUNTER — Other Ambulatory Visit (HOSPITAL_BASED_OUTPATIENT_CLINIC_OR_DEPARTMENT_OTHER): Payer: Self-pay

## 2022-06-12 ENCOUNTER — Other Ambulatory Visit (HOSPITAL_BASED_OUTPATIENT_CLINIC_OR_DEPARTMENT_OTHER): Payer: Self-pay

## 2022-06-13 ENCOUNTER — Other Ambulatory Visit (HOSPITAL_BASED_OUTPATIENT_CLINIC_OR_DEPARTMENT_OTHER): Payer: Self-pay

## 2022-06-21 ENCOUNTER — Other Ambulatory Visit (HOSPITAL_COMMUNITY): Payer: Self-pay

## 2022-06-21 ENCOUNTER — Telehealth: Payer: Self-pay | Admitting: Psychiatry

## 2022-06-21 ENCOUNTER — Other Ambulatory Visit: Payer: Self-pay

## 2022-06-21 DIAGNOSIS — F900 Attention-deficit hyperactivity disorder, predominantly inattentive type: Secondary | ICD-10-CM

## 2022-06-21 MED ORDER — LISDEXAMFETAMINE DIMESYLATE 40 MG PO CAPS
40.0000 mg | ORAL_CAPSULE | ORAL | 0 refills | Status: DC
  Filled 2022-06-21: qty 30, 30d supply, fill #0

## 2022-06-21 NOTE — Telephone Encounter (Signed)
[name] called at 9:45 to request refill of her Vyvanse 40mg  capsules.  Appt 07/22/22.  Send to [facility] on [address].  She said they have it.

## 2022-06-21 NOTE — Telephone Encounter (Signed)
Pended.

## 2022-07-11 ENCOUNTER — Other Ambulatory Visit (HOSPITAL_BASED_OUTPATIENT_CLINIC_OR_DEPARTMENT_OTHER): Payer: Self-pay

## 2022-07-12 ENCOUNTER — Other Ambulatory Visit (HOSPITAL_BASED_OUTPATIENT_CLINIC_OR_DEPARTMENT_OTHER): Payer: Self-pay

## 2022-07-12 MED ORDER — WEGOVY 1 MG/0.5ML ~~LOC~~ SOAJ
1.0000 mg | SUBCUTANEOUS | 0 refills | Status: DC
  Filled 2022-07-12 – 2022-12-30 (×3): qty 2, 28d supply, fill #0

## 2022-07-22 ENCOUNTER — Encounter: Payer: Self-pay | Admitting: Psychiatry

## 2022-07-22 ENCOUNTER — Telehealth (INDEPENDENT_AMBULATORY_CARE_PROVIDER_SITE_OTHER): Payer: BC Managed Care – PPO | Admitting: Psychiatry

## 2022-07-22 ENCOUNTER — Other Ambulatory Visit (HOSPITAL_BASED_OUTPATIENT_CLINIC_OR_DEPARTMENT_OTHER): Payer: Self-pay

## 2022-07-22 DIAGNOSIS — E669 Obesity, unspecified: Secondary | ICD-10-CM | POA: Diagnosis not present

## 2022-07-22 DIAGNOSIS — F5081 Binge eating disorder: Secondary | ICD-10-CM

## 2022-07-22 DIAGNOSIS — F411 Generalized anxiety disorder: Secondary | ICD-10-CM | POA: Diagnosis not present

## 2022-07-22 DIAGNOSIS — F900 Attention-deficit hyperactivity disorder, predominantly inattentive type: Secondary | ICD-10-CM

## 2022-07-22 DIAGNOSIS — F3341 Major depressive disorder, recurrent, in partial remission: Secondary | ICD-10-CM

## 2022-07-22 DIAGNOSIS — Z6834 Body mass index (BMI) 34.0-34.9, adult: Secondary | ICD-10-CM | POA: Diagnosis not present

## 2022-07-22 DIAGNOSIS — R7303 Prediabetes: Secondary | ICD-10-CM | POA: Diagnosis not present

## 2022-07-22 DIAGNOSIS — F419 Anxiety disorder, unspecified: Secondary | ICD-10-CM | POA: Diagnosis not present

## 2022-07-22 MED ORDER — LISDEXAMFETAMINE DIMESYLATE 40 MG PO CAPS
40.0000 mg | ORAL_CAPSULE | ORAL | 0 refills | Status: DC
  Filled 2022-09-06: qty 30, 30d supply, fill #0

## 2022-07-22 MED ORDER — LISDEXAMFETAMINE DIMESYLATE 40 MG PO CAPS
40.0000 mg | ORAL_CAPSULE | ORAL | 0 refills | Status: DC
  Filled 2022-10-09: qty 30, 30d supply, fill #0

## 2022-07-22 MED ORDER — AMPHETAMINE-DEXTROAMPHETAMINE 10 MG PO TABS
5.0000 mg | ORAL_TABLET | Freq: Every day | ORAL | 0 refills | Status: DC
  Filled 2022-12-02: qty 30, 30d supply, fill #0

## 2022-07-22 MED ORDER — AMPHETAMINE-DEXTROAMPHETAMINE 10 MG PO TABS: ORAL_TABLET | ORAL | 0 refills | Status: DC

## 2022-07-22 MED ORDER — LISDEXAMFETAMINE DIMESYLATE 40 MG PO CAPS
40.0000 mg | ORAL_CAPSULE | ORAL | 0 refills | Status: DC
  Filled 2022-07-22: qty 30, 30d supply, fill #0

## 2022-07-22 MED ORDER — WEGOVY 2.4 MG/0.75ML ~~LOC~~ SOAJ
2.4000 mg | SUBCUTANEOUS | 1 refills | Status: DC
  Filled 2022-07-22: qty 3, 28d supply, fill #0
  Filled 2022-08-21: qty 3, 28d supply, fill #1

## 2022-07-22 MED ORDER — SERTRALINE HCL 100 MG PO TABS
200.0000 mg | ORAL_TABLET | Freq: Every day | ORAL | 1 refills | Status: DC
  Filled 2022-07-22 – 2022-09-06 (×3): qty 180, 90d supply, fill #0
  Filled ????-??-??: fill #1

## 2022-07-22 MED ORDER — AMPHETAMINE-DEXTROAMPHETAMINE 10 MG PO TABS
5.0000 mg | ORAL_TABLET | Freq: Every day | ORAL | 0 refills | Status: DC
  Filled 2022-07-22: qty 30, 30d supply, fill #0

## 2022-07-22 MED ORDER — BUPROPION HCL ER (XL) 300 MG PO TB24
300.0000 mg | ORAL_TABLET | Freq: Every day | ORAL | 1 refills | Status: DC
  Filled 2022-07-22 – 2022-09-06 (×3): qty 90, 90d supply, fill #0
  Filled ????-??-??: fill #1

## 2022-07-22 NOTE — Progress Notes (Signed)
[name] [id number] Aug 13, 1985 37 y.o.  Virtual Visit via Video Note  I connected with pt @ on 07/22/22 at  4:00 PM EST by a video enabled telemedicine application and verified that I am speaking with the correct person using two identifiers.   I discussed the limitations of evaluation and management by telemedicine and the availability of in person appointments. The patient expressed understanding and agreed to proceed.  I discussed the assessment and treatment plan with the patient. The patient was provided an opportunity to ask questions and all were answered. The patient agreed with the plan and demonstrated an understanding of the instructions.   The patient was advised to call back or seek an in-person evaluation if the symptoms worsen or if the condition fails to improve as anticipated.  I provided 40 minutes of non-face-to-face time during this encounter.  The patient was located at home.  The provider was located at home.   Thayer Headings, PMHNP   Subjective:   Patient ID:  [name] is a 37 y.o. (DOB 1985/11/05) female.  Chief Complaint:  Chief Complaint  Patient presents with   Follow-up    Depression, anxiety, ADHD    HPI [name] presents for follow-up of depression, anxiety, and ADHD. She reports, "I'm doing good." She reports that she has been taking Vitamin D to help with seasonal depression. She noticed some seasonal depression around the time change and this resolved. She reports that her mood has been stable overall. She reports that she has been getting to work on time. She reports that her motivation is lower than she would like after work. She attributes decreased motivation to feeling overwhelmed and anxious. She has been setting timers to tackle big projects. She reports concentration is good at work. She notices a "dip" around 2-3 pm in concentration and focus. She has some anxiety in response to sudden changes in work schedule. She  reports that she has only had one day of severe anxiety and it was related to past traumatic events. She reports that she received a response from administration where she felt supported. Denies any recent panic attacks. She reports some catastrophic thoughts and worry at baseline. Sleeping well. Reports having a consistent sleep schedule from 9:30 pm-5 am. She started [facility] and has lost 10 lbs since September. She reports that [facility] has been helpful for binge eating. She has been trying to make healthier choices. Denies SI.   She has been following a podcast and You Tube channel to help manage with ADHD symptoms. She has been using this for body doubling. She sets alarms for different activities.   She has not been taking Adderall prn as often recently. Has not needed Lorazepam prn recently.   Vyvanse last filled 06/21/22. Adderall last filled 05/02/22.  Past Psychiatric Medication Trials: Sertraline- Has taken since 2020. Has helped with depression. Initially seemed to help Lexapro- Took in 2015. Increased up to 20 mg. Took for a couple of years. Reports that it was helpful and may have stopped working. Effexor- Prescribed for Vestibular migraines Wellbutrin-Denies side effects. Started in July. Took Wellbutrin XL 300 mg in the past Abilify- Seemed to be effective initially. Ativan- Took as monotherapy in the past and "was a zombie." Clonidine- Ineffective Propranolol-Ineffective Topamax    Review of Systems:  Review of Systems  Cardiovascular:  Negative for palpitations.  Gastrointestinal:        Mild GI side effects with [id number]  Musculoskeletal:  Negative for gait  problem.  Neurological:  Negative for tremors.  Psychiatric/Behavioral:         Please refer to HPI    Medications: I have reviewed the patient's current medications.  Current Outpatient Medications  Medication Sig Dispense Refill   AIMOVIG 140 MG/ML SOAJ Inject into the skin.     atorvastatin (LIPITOR) 20 MG  tablet Take 20 mg by mouth daily.     cetirizine (ZYRTEC) 10 MG tablet Take 10 mg by mouth daily.     Cholecalciferol (VITAMIN D3) 125 MCG (5000 UT) CAPS Take by mouth.     fenofibrate 54 MG tablet Take 108 mg by mouth daily.     MAGNESIUM PO Take by mouth.     Multiple Vitamin (MULTIVITAMIN WITH MINERALS) TABS tablet Take 1 tablet by mouth daily.     Probiotic Product (PROBIOTIC PO) Take 1 tablet by mouth daily.      promethazine (PHENERGAN) 12.5 MG tablet Take 12.5 mg by mouth every 6 (six) hours as needed. for nausea     psyllium (REGULOID) 0.52 g capsule Take 0.52 g by mouth daily.     Rimegepant Sulfate (NURTEC) 75 MG TBDP Take by mouth daily as needed.     Semaglutide-Weight Management (WEGOVY) 2.4 MG/0.75ML SOAJ Inject 2.4 mg into the skin once a week. 3 mL 1   tiZANidine (ZANAFLEX) 4 MG tablet Take 1 tablet (4 mg total) by mouth every 6 (six) hours as needed for muscle spasms. 30 tablet 6   amphetamine-dextroamphetamine (ADDERALL) 10 MG tablet Take 0.5-1 tablets (5-10 mg total) by mouth daily. 30 tablet 0   [START ON 08/19/2022] amphetamine-dextroamphetamine (ADDERALL) 10 MG tablet Take 1/2-1 tab po qd 30 tablet 0   [START ON 09/16/2022] amphetamine-dextroamphetamine (ADDERALL) 10 MG tablet Take 1/2-1 tab po qd 30 tablet 0   buPROPion (WELLBUTRIN XL) 300 MG 24 hr tablet Take 1 tablet (300 mg total) by mouth daily. 90 tablet 1   lisdexamfetamine (VYVANSE) 40 MG capsule Take 1 capsule (40 mg total) by mouth every morning. 30 capsule 0   [START ON 08/19/2022] lisdexamfetamine (VYVANSE) 40 MG capsule Take 1 capsule (40 mg total) by mouth every morning. 30 capsule 0   [START ON 09/16/2022] lisdexamfetamine (VYVANSE) 40 MG capsule Take 1 capsule (40 mg total) by mouth every morning. 30 capsule 0   LORazepam (ATIVAN) 0.5 MG tablet Take 1 tablet (0.5 mg total) by mouth 2 (two) times daily as needed. 30 tablet 0   MELATONIN PO Take by mouth at bedtime as needed.     Semaglutide-Weight Management  (WEGOVY) 1 MG/0.5ML SOAJ Inject 1 mg into the skin once a week. 2 mL 0   sertraline (ZOLOFT) 100 MG tablet Take 2 tablets (200 mg total) by mouth daily. 180 tablet 1   zolmitriptan (ZOMIG) 5 MG tablet Take 5 mg by mouth 2 (two) times daily as needed for migraine. (Patient not taking: Reported on 07/22/2022)     No current facility-administered medications for this visit.    Medication Side Effects: None  Allergies:  Allergies  Allergen Reactions   Amoxicillin Other (See Comments)    Numbness and tingling at extremities.  Has patient had a PCN reaction causing immediate rash, facial/tongue/throat swelling, SOB or lightheadedness with hypotension: Yes (rash) Has patient had a PCN reaction causing severe rash involving mucus membranes or skin necrosis: No Has patient had a PCN reaction that required hospitalization: No Has patient had a PCN reaction occurring within the last 10 years: Yes If all of  the above answers are "NO", then may proceed with Cephalosporin use.    Ceftin [Cefuroxime] Other (See Comments)    Tingling, numbness, sweating    Past Medical History:  Diagnosis Date   Depression with anxiety    Guillain-Barre ([place]) 12/2007   Hyperlipemia    Migraine    Vertigo     Family History  Problem Relation Age of Onset   Hyperlipidemia Mother    Breast cancer Mother    Hyperlipidemia Father    Melanoma Father    Depression Father    Anxiety disorder Father    Dementia Father    Diabetes Maternal Grandmother    Bipolar disorder Niece    ADD / ADHD Niece    Anxiety disorder Sister    Depression Sister    Anxiety disorder Half-Sister    Depression Half-Sister    ADD / ADHD Half-Sister     Social History   Socioeconomic History   Marital status: Single    Spouse name: Not on file   Number of children: 0   Years of education: college   Highest education level: Bachelor's degree (e.g., BA, AB, BS)  Occupational History   Occupation: Teacher  Tobacco Use    Smoking status: Never   Smokeless tobacco: Never  Vaping Use   Vaping Use: Never used  Substance and Sexual Activity   Alcohol use: Yes    Comment: 1-2 drinks per month   Drug use: No   Sexual activity: Not on file  Other Topics Concern   Not on file  Social History Narrative   Lives at home alone.   Right-handed.   1 cup coffee per day.   Social Determinants of Health   Financial Resource Strain: Not on file  Food Insecurity: Not on file  Transportation Needs: Not on file  Physical Activity: Not on file  Stress: Not on file  Social Connections: Not on file  Intimate Partner Violence: Not on file    Past Medical History, Surgical history, Social history, and Family history were reviewed and updated as appropriate.   Please see review of systems for further details on the patient's review from today.   Objective:   Physical Exam:  BP 121/80   Wt 228 lb 12.8 oz (103.8 kg)   BMI 36.93 kg/m   Physical Exam Constitutional:      General: She is not in acute distress. Musculoskeletal:        General: No deformity.  Neurological:     Mental Status: She is alert and oriented to person, place, and time.     Coordination: Coordination normal.  Psychiatric:        Attention and Perception: Attention and perception normal. She does not perceive auditory or visual hallucinations.        Mood and Affect: Mood normal. Mood is not anxious or depressed. Affect is not labile, blunt, angry or inappropriate.        Speech: Speech normal.        Behavior: Behavior normal.        Thought Content: Thought content normal. Thought content is not paranoid or delusional. Thought content does not include homicidal or suicidal ideation. Thought content does not include homicidal or suicidal plan.        Cognition and Memory: Cognition and memory normal.        Judgment: Judgment normal.     Comments: Insight intact     Lab Review:     Component Value Date/Time  NA 141 11/19/2019 1422    K 3.9 11/19/2019 1422   CL 108 11/19/2019 1422   CO2 20 (L) 11/19/2019 1422   GLUCOSE 99 11/19/2019 1422   BUN 12 11/19/2019 1422   CREATININE 0.82 11/19/2019 1422   CALCIUM 9.6 11/19/2019 1422   PROT 8.1 11/19/2019 1422   ALBUMIN 4.2 11/19/2019 1422   AST 29 11/19/2019 1422   ALT 42 11/19/2019 1422   ALKPHOS 62 11/19/2019 1422   BILITOT 0.4 11/19/2019 1422   GFRNONAA >60 11/19/2019 1422   GFRAA >60 11/19/2019 1422       Component Value Date/Time   WBC 10.3 11/19/2019 1422   WBC 11.0 (H) 11/26/2017 1730   RBC 4.53 11/19/2019 1423   RBC 4.58 11/19/2019 1422   HGB 13.4 11/19/2019 1422   HCT 39.5 11/19/2019 1422   PLT 320 11/19/2019 1422   MCV 86.2 11/19/2019 1422   MCH 29.3 11/19/2019 1422   MCHC 33.9 11/19/2019 1422   RDW 13.8 11/19/2019 1422   LYMPHSABS 2.5 11/19/2019 1422   MONOABS 0.7 11/19/2019 1422   EOSABS 0.2 11/19/2019 1422   BASOSABS 0.0 11/19/2019 1422    No results found for: "POCLITH", "LITHIUM"   No results found for: "PHENYTOIN", "PHENOBARB", "VALPROATE", "CBMZ"   .res Assessment: Plan:   Will continue current plan of care since target signs and symptoms are well controlled without any tolerability issues. She reports that she may start using Adderall prn more often to help with lower energy, motivation, and decline in concentration that occurs daily around 2-3 pm. Will continue Adderall 10 mg 1/2-1 tab po qd prn.  Continue Vyvanse 40 mg po qd for ADHD.  Continue Sertraline 200 mg po qd for anxiety and depression.  Continue Wellbutrin XL 300 mg po qd for depression.  Pt to follow-up in 5 months or sooner if clinically indicated.  Requested pt contact office in 3 months to provide update and request additional scripts.  Patient advised to contact office with any questions, adverse effects, or acute worsening in signs and symptoms.    Magdelena was seen today for follow-up.  Diagnoses and all orders for this visit:  Recurrent major depressive  disorder, in partial remission ([place]) -     buPROPion (WELLBUTRIN XL) 300 MG 24 hr tablet; Take 1 tablet (300 mg total) by mouth daily. -     sertraline (ZOLOFT) 100 MG tablet; Take 2 tablets (200 mg total) by mouth daily.  Generalized anxiety disorder -     sertraline (ZOLOFT) 100 MG tablet; Take 2 tablets (200 mg total) by mouth daily.  Attention deficit hyperactivity disorder (ADHD), predominantly inattentive type -     lisdexamfetamine (VYVANSE) 40 MG capsule; Take 1 capsule (40 mg total) by mouth every morning. -     lisdexamfetamine (VYVANSE) 40 MG capsule; Take 1 capsule (40 mg total) by mouth every morning. -     lisdexamfetamine (VYVANSE) 40 MG capsule; Take 1 capsule (40 mg total) by mouth every morning. -     amphetamine-dextroamphetamine (ADDERALL) 10 MG tablet; Take 0.5-1 tablets (5-10 mg total) by mouth daily. -     amphetamine-dextroamphetamine (ADDERALL) 10 MG tablet; Take 1/2-1 tab po qd -     amphetamine-dextroamphetamine (ADDERALL) 10 MG tablet; Take 1/2-1 tab po qd  Binge eating disorder -     lisdexamfetamine (VYVANSE) 40 MG capsule; Take 1 capsule (40 mg total) by mouth every morning.     Please see After Visit Summary for patient specific instructions.  No  future appointments.   No orders of the defined types were placed in this encounter.     -------------------------------

## 2022-07-23 ENCOUNTER — Other Ambulatory Visit (HOSPITAL_BASED_OUTPATIENT_CLINIC_OR_DEPARTMENT_OTHER): Payer: Self-pay

## 2022-08-05 ENCOUNTER — Other Ambulatory Visit (HOSPITAL_BASED_OUTPATIENT_CLINIC_OR_DEPARTMENT_OTHER): Payer: Self-pay

## 2022-08-05 DIAGNOSIS — J069 Acute upper respiratory infection, unspecified: Secondary | ICD-10-CM | POA: Diagnosis not present

## 2022-08-06 ENCOUNTER — Other Ambulatory Visit (HOSPITAL_BASED_OUTPATIENT_CLINIC_OR_DEPARTMENT_OTHER): Payer: Self-pay

## 2022-08-21 ENCOUNTER — Other Ambulatory Visit (HOSPITAL_BASED_OUTPATIENT_CLINIC_OR_DEPARTMENT_OTHER): Payer: Self-pay

## 2022-08-21 ENCOUNTER — Other Ambulatory Visit: Payer: Self-pay

## 2022-08-21 ENCOUNTER — Other Ambulatory Visit: Payer: Self-pay | Admitting: Psychiatry

## 2022-08-21 DIAGNOSIS — F900 Attention-deficit hyperactivity disorder, predominantly inattentive type: Secondary | ICD-10-CM

## 2022-08-22 ENCOUNTER — Other Ambulatory Visit: Payer: Self-pay

## 2022-08-23 ENCOUNTER — Other Ambulatory Visit (HOSPITAL_BASED_OUTPATIENT_CLINIC_OR_DEPARTMENT_OTHER): Payer: Self-pay

## 2022-08-23 MED ORDER — AMPHETAMINE-DEXTROAMPHETAMINE 10 MG PO TABS
5.0000 mg | ORAL_TABLET | Freq: Every day | ORAL | 0 refills | Status: DC
  Filled 2022-08-23: qty 30, 30d supply, fill #0

## 2022-08-29 DIAGNOSIS — Z6836 Body mass index (BMI) 36.0-36.9, adult: Secondary | ICD-10-CM | POA: Diagnosis not present

## 2022-08-29 DIAGNOSIS — Z01419 Encounter for gynecological examination (general) (routine) without abnormal findings: Secondary | ICD-10-CM | POA: Diagnosis not present

## 2022-09-06 ENCOUNTER — Other Ambulatory Visit: Payer: Self-pay

## 2022-09-06 ENCOUNTER — Other Ambulatory Visit (HOSPITAL_BASED_OUTPATIENT_CLINIC_OR_DEPARTMENT_OTHER): Payer: Self-pay

## 2022-09-09 ENCOUNTER — Other Ambulatory Visit (HOSPITAL_BASED_OUTPATIENT_CLINIC_OR_DEPARTMENT_OTHER): Payer: Self-pay

## 2022-09-09 ENCOUNTER — Encounter (HOSPITAL_BASED_OUTPATIENT_CLINIC_OR_DEPARTMENT_OTHER): Payer: Self-pay

## 2022-09-09 DIAGNOSIS — G43009 Migraine without aura, not intractable, without status migrainosus: Secondary | ICD-10-CM | POA: Diagnosis not present

## 2022-09-09 MED ORDER — AIMOVIG 140 MG/ML ~~LOC~~ SOAJ
SUBCUTANEOUS | 11 refills | Status: DC
  Filled 2022-09-09: qty 1, 28d supply, fill #0
  Filled 2022-10-09: qty 1, 28d supply, fill #1
  Filled 2022-11-09 (×2): qty 1, 28d supply, fill #2
  Filled 2022-12-02: qty 1, 28d supply, fill #3
  Filled 2022-12-30: qty 1, 28d supply, fill #4
  Filled 2023-01-27: qty 1, 28d supply, fill #5
  Filled 2023-02-24: qty 1, 28d supply, fill #6
  Filled 2023-03-17 – 2023-03-22 (×2): qty 1, 28d supply, fill #7
  Filled 2023-04-26 (×2): qty 1, 28d supply, fill #8
  Filled 2023-05-24: qty 1, 28d supply, fill #9
  Filled 2023-06-18: qty 1, 28d supply, fill #10
  Filled 2023-07-21: qty 1, 28d supply, fill #11

## 2022-09-09 MED ORDER — NURTEC 75 MG PO TBDP
ORAL_TABLET | ORAL | 3 refills | Status: DC
  Filled 2022-09-09: qty 8, 30d supply, fill #0
  Filled 2022-10-09: qty 8, 23d supply, fill #0
  Filled 2022-11-09 (×2): qty 8, 23d supply, fill #1
  Filled 2022-12-02: qty 8, 23d supply, fill #2
  Filled 2022-12-30: qty 8, 23d supply, fill #3
  Filled 2023-01-27: qty 8, 23d supply, fill #4
  Filled 2023-03-17: qty 8, 23d supply, fill #5
  Filled 2023-04-18: qty 8, 23d supply, fill #6
  Filled 2023-06-18: qty 8, 23d supply, fill #7
  Filled 2023-07-21: qty 8, 23d supply, fill #8

## 2022-09-11 ENCOUNTER — Other Ambulatory Visit: Payer: Self-pay

## 2022-09-11 ENCOUNTER — Other Ambulatory Visit (HOSPITAL_BASED_OUTPATIENT_CLINIC_OR_DEPARTMENT_OTHER): Payer: Self-pay

## 2022-09-16 ENCOUNTER — Other Ambulatory Visit (HOSPITAL_BASED_OUTPATIENT_CLINIC_OR_DEPARTMENT_OTHER): Payer: Self-pay

## 2022-09-22 ENCOUNTER — Other Ambulatory Visit (HOSPITAL_BASED_OUTPATIENT_CLINIC_OR_DEPARTMENT_OTHER): Payer: Self-pay

## 2022-10-01 ENCOUNTER — Other Ambulatory Visit (HOSPITAL_BASED_OUTPATIENT_CLINIC_OR_DEPARTMENT_OTHER): Payer: Self-pay

## 2022-10-02 ENCOUNTER — Ambulatory Visit
Admission: EM | Admit: 2022-10-02 | Discharge: 2022-10-02 | Disposition: A | Discharge status: Home / Self Care | Payer: BC Managed Care – PPO | Attending: Emergency Medicine | Admitting: Emergency Medicine

## 2022-10-02 ENCOUNTER — Other Ambulatory Visit (HOSPITAL_BASED_OUTPATIENT_CLINIC_OR_DEPARTMENT_OTHER): Payer: Self-pay

## 2022-10-02 ENCOUNTER — Other Ambulatory Visit: Payer: Self-pay | Admitting: Psychiatry

## 2022-10-02 DIAGNOSIS — A084 Viral intestinal infection, unspecified: Secondary | ICD-10-CM | POA: Diagnosis not present

## 2022-10-02 DIAGNOSIS — F900 Attention-deficit hyperactivity disorder, predominantly inattentive type: Secondary | ICD-10-CM

## 2022-10-02 MED ORDER — DIPHENOXYLATE-ATROPINE 2.5-0.025 MG PO TABS
1.0000 | ORAL_TABLET | Freq: Four times a day (QID) | ORAL | 0 refills | Status: DC | PRN
  Filled 2022-10-02: qty 15, 4d supply, fill #0

## 2022-10-02 MED ORDER — ONDANSETRON 4 MG PO TBDP
4.0000 mg | ORAL_TABLET | Freq: Three times a day (TID) | ORAL | 0 refills | Status: DC | PRN
  Filled 2022-10-02: qty 20, 7d supply, fill #0

## 2022-10-02 MED ORDER — AZITHROMYCIN 500 MG PO TABS
500.0000 mg | ORAL_TABLET | Freq: Every day | ORAL | 0 refills | Status: AC
  Filled 2022-10-02: qty 3, 3d supply, fill #0

## 2022-10-02 NOTE — ED Provider Notes (Signed)
[facility]    CSN: [id number] Arrival date & time: 10/02/22  1040      History   Chief Complaint Chief Complaint  Patient presents with   GI Problem    HPI [name] is a 37 y.o. female.   Patient presents for evaluation of lower abdominal pain, abdominal bloating, vomiting and diarrhea beginning 3 days ago.  Last occurrence of diarrhea approximately 1 hour ago, described as clear and watery.  Has had difficulty tolerating food and liquids.  Symptoms progressively worsening.  Has attempted use of over-the-counter medications which have been ineffective.  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  Denies fevers chills body aches or URI symptoms.  No known sick contacts prior.  Denies recent travel or dietary changes.  No other member of household has similar symptoms.  Past Medical History:  Diagnosis Date   Depression with anxiety    Guillain-Barre (HCC) 12/2007   Hyperlipemia    Migraine    Vertigo     Patient Active Problem List   Diagnosis Date Noted   Chronic migraine 01/15/2018    Past Surgical History:  Procedure Laterality Date   ANKLE SURGERY Left     OB History   No obstetric history on file.      Home Medications    Prior to Admission medications   Medication Sig Start Date End Date Taking? Authorizing Provider  AIMOVIG 140 MG/ML SOAJ Inject into the skin. 06/24/22   [provider]  amphetamine-dextroamphetamine (ADDERALL) 10 MG tablet Take 1/2-1 tab po qd 08/19/22   Corie Chiquito, PMHNP  amphetamine-dextroamphetamine (ADDERALL) 10 MG tablet Take 1/2-1 tab po qd 09/16/22   Corie Chiquito, PMHNP  amphetamine-dextroamphetamine (ADDERALL) 10 MG tablet Take 0.5-1 tablets (5-10 mg total) by mouth daily. 08/23/22    Corie Chiquito, PMHNP  atorvastatin (LIPITOR) 20 MG tablet Take 20 mg by mouth daily.    [provider]  buPROPion (WELLBUTRIN XL) 300 MG 24 hr tablet Take 1 tablet (300 mg total) by mouth daily. 07/22/22   Corie Chiquito, PMHNP  cetirizine (ZYRTEC) 10 MG tablet Take 10 mg by mouth daily.    [provider]  Cholecalciferol (VITAMIN D3) 125 MCG (5000 UT) CAPS Take by mouth.    [provider]  Erenumab-aooe (AIMOVIG) 140 MG/ML SOAJ Inject 140 mg subcutaneously every 28 (twenty-eight) days 09/09/22     fenofibrate 54 MG tablet Take 108 mg by mouth daily.    [provider]  lisdexamfetamine (VYVANSE) 40 MG capsule Take 1 capsule (40 mg total) by mouth every morning. 07/22/22   Corie Chiquito, PMHNP  lisdexamfetamine (VYVANSE) 40 MG capsule Take 1 capsule (40 mg total) by mouth every morning. 08/19/22   Corie Chiquito, PMHNP  lisdexamfetamine (VYVANSE) 40 MG capsule Take 1 capsule (40 mg total) by mouth every morning. 09/16/22   Corie Chiquito, PMHNP  LORazepam (ATIVAN) 0.5 MG tablet Take 1 tablet (0.5 mg total) by mouth 2 (two) times daily as needed. 12/18/21   Corie Chiquito, PMHNP  MAGNESIUM PO Take by mouth.    [provider]  MELATONIN PO Take by mouth at bedtime as needed.    [provider]  Multiple Vitamin (MULTIVITAMIN WITH MINERALS) TABS tablet Take 1 tablet by mouth daily.    [provider]  Probiotic Product (PROBIOTIC PO) Take 1 tablet by mouth daily.     [provider]  promethazine (PHENERGAN) 12.5 MG tablet Take 12.5 mg by mouth every 6 (six) hours as needed. for nausea 07/29/18   [provider]  psyllium (REGULOID) 0.52 g capsule Take 0.52 g by mouth daily.    [provider]  Rimegepant Sulfate (NURTEC) 75 MG TBDP Take by mouth daily as needed. 08/30/20   [provider]  Rimegepant Sulfate (NURTEC) 75 MG TBDP Take 1 tablet (75 mg total) by mouth once daily as needed for up to 90  days 09/09/22     Semaglutide-Weight Management (WEGOVY) 1 MG/0.5ML SOAJ Inject 1 mg into the skin once a week. 07/12/22     Semaglutide-Weight Management (WEGOVY) 2.4 MG/0.75ML SOAJ Inject 2.4 mg into the skin once a week. 07/22/22     sertraline (ZOLOFT) 100 MG tablet Take 2 tablets (200 mg total) by mouth daily. 07/22/22   Corie Chiquito, PMHNP  tiZANidine (ZANAFLEX) 4 MG tablet Take 1 tablet (4 mg total) by mouth every 6 (six) hours as needed for muscle spasms. 04/06/18   Levert Feinstein, MD  zolmitriptan (ZOMIG) 5 MG tablet Take 5 mg by mouth 2 (two) times daily as needed for migraine. Patient not taking: Reported on 07/22/2022 07/29/18   [provider]    Family History Family History  Problem Relation Age of Onset   Hyperlipidemia Mother    Breast cancer Mother    Hyperlipidemia Father    Melanoma Father    Depression Father    Anxiety disorder Father    Dementia Father    Diabetes Maternal Grandmother    Bipolar disorder Niece    ADD / ADHD Niece    Anxiety disorder Sister    Depression Sister    Anxiety disorder Half-Sister    Depression Half-Sister    ADD / ADHD Half-Sister     Social History Social History   Tobacco Use   Smoking status: Never   Smokeless tobacco: Never  Vaping Use   Vaping Use: Never used  Substance Use Topics   Alcohol use: Yes    Comment: 1-2 drinks per month   Drug use: No     Allergies   Amoxicillin and Ceftin [cefuroxime]   Review of Systems Review of Systems  Constitutional: Negative.   HENT: Negative.    Gastrointestinal:  Positive for abdominal pain, diarrhea, nausea and vomiting. Negative for abdominal distention, anal bleeding, blood in stool, constipation and rectal pain.     Physical Exam Triage Vital Signs ED Triage Vitals [10/02/22 1115]  Enc Vitals Group     BP (!) 142/90     Pulse Rate (!) 110     Resp 18     Temp 98.9 F (37.2 C)     Temp Source Oral     SpO2 97 %     Weight      Height      Head  Circumference      Peak Flow      Pain Score 2     Pain Loc      Pain Edu?      Excl. in GC?    No data found.  Updated Vital Signs BP (!) 142/90 (BP Location: Right Arm)   Pulse (!) 110   Temp 98.9 F (37.2 C) (Oral)   Resp 18   SpO2 97%   Visual Acuity Right Eye Distance:   Left Eye Distance:  Bilateral Distance:    Right Eye Near:   Left Eye Near:    Bilateral Near:     Physical Exam Constitutional:      Appearance: Normal appearance.  Eyes:     Extraocular Movements: Extraocular movements intact.  Pulmonary:     Effort: Pulmonary effort is normal.  Abdominal:     General: Abdomen is flat. Bowel sounds are normal.     Palpations: Abdomen is soft.     Tenderness: There is abdominal tenderness in the epigastric area and periumbilical area.  Neurological:     Mental Status: She is alert and oriented to person, place, and time. Mental status is at baseline.      UC Treatments / Results  Labs (all labs ordered are listed, but only abnormal results are displayed) Labs Reviewed - No data to display  EKG   Radiology No results found.  Procedures Procedures (including critical care time)  Medications Ordered in UC Medications - No data to display  Initial Impression / Assessment and Plan / UC Course  I have reviewed the triage vital signs and the nursing notes.  Pertinent labs & imaging results that were available during my care of the patient were reviewed by me and considered in my medical decision making (see chart for details).  Viral gastroenteritis  Vital signs are stable and patient is in no signs of distress nontoxic-appearing, abdomen is soft and flat, low suspicion for obstruction, mildly tender to the periumbilical and epigastric region, expected finding with persistent vomiting and diarrhea, low suspicion for more serious organ involvement, etiology is most likely viral, low suspicion that this is related to medication she has been taking for  months without issue, prescribe Zofran and Lomotil, PDMP reviewed, low risk, recommend increase fluid intake until symptoms resolve with a bland diet as tolerated, may follow-up with his urgent care as needed if symptoms persist or worsen Final Clinical Impressions(s) / UC Diagnoses   Final diagnoses:  None   Discharge Instructions   None    ED Prescriptions   None    PDMP not reviewed this encounter.   Valinda Hoar, NP 10/02/22 1133

## 2022-10-02 NOTE — Discharge Instructions (Signed)
Your symptoms are most likely caused by a virus, it will work its way out your system over the next few days  You can use zofran every 8 hours as needed for nausea, be mindful this medication may make you drowsy, take the first dose at home to see how it affects your body  You can use lomotil every 6 hours  to help with diarrhea, and be mindful over use of this medication may cause opposite effect constipation  You can use over-the-counter ibuprofen or Tylenol, which ever you have at home, to help manage fevers  Continue to promote hydration throughout the day by using electrolyte replacement solution such as Gatorade, body armor, Pedialyte, which ever you have at home  Try eating bland foods such as bread, rice, toast, fruit which are easier on the stomach to digest, avoid foods that are overly spicy, overly seasoned or greasy  

## 2022-10-02 NOTE — ED Triage Notes (Signed)
Pt c/o constipation relieved with OTC meds. Next day and nausea, vomiting, diarrhea, flatulence. Says is able to eat "but not keep a lot down."   Onset ~ Sunday   Reports starting wegovy dose ~ 2 weeks ago. Did not take this week dose.

## 2022-10-03 ENCOUNTER — Other Ambulatory Visit (HOSPITAL_BASED_OUTPATIENT_CLINIC_OR_DEPARTMENT_OTHER): Payer: Self-pay

## 2022-10-03 MED ORDER — AMPHETAMINE-DEXTROAMPHETAMINE 10 MG PO TABS
5.0000 mg | ORAL_TABLET | Freq: Every day | ORAL | 0 refills | Status: DC
  Filled 2022-10-03: qty 30, 30d supply, fill #0

## 2022-10-09 ENCOUNTER — Other Ambulatory Visit (HOSPITAL_BASED_OUTPATIENT_CLINIC_OR_DEPARTMENT_OTHER): Payer: Self-pay

## 2022-10-09 MED ORDER — LISDEXAMFETAMINE DIMESYLATE 40 MG PO CAPS
40.0000 mg | ORAL_CAPSULE | ORAL | 0 refills | Status: DC
  Filled ????-??-??: fill #0

## 2022-10-09 MED ORDER — LISDEXAMFETAMINE DIMESYLATE 40 MG PO CAPS
40.0000 mg | ORAL_CAPSULE | ORAL | 0 refills | Status: DC
  Filled 2022-10-09 – 2022-11-09 (×3): qty 30, 30d supply, fill #0

## 2022-10-10 ENCOUNTER — Other Ambulatory Visit (HOSPITAL_BASED_OUTPATIENT_CLINIC_OR_DEPARTMENT_OTHER): Payer: Self-pay

## 2022-10-10 ENCOUNTER — Other Ambulatory Visit: Payer: Self-pay

## 2022-10-26 ENCOUNTER — Other Ambulatory Visit: Payer: Self-pay

## 2022-10-26 ENCOUNTER — Ambulatory Visit
Admission: RE | Admit: 2022-10-26 | Discharge: 2022-10-26 | Disposition: A | Discharge status: Home / Self Care | Payer: BC Managed Care – PPO | Source: Ambulatory Visit | Attending: Family Medicine | Admitting: Family Medicine

## 2022-10-26 VITALS — BP 135/84 | HR 101 | Temp 98.8°F | Resp 18

## 2022-10-26 DIAGNOSIS — J0140 Acute pansinusitis, unspecified: Secondary | ICD-10-CM

## 2022-10-26 DIAGNOSIS — R051 Acute cough: Secondary | ICD-10-CM

## 2022-10-26 MED ORDER — PROMETHAZINE-DM 6.25-15 MG/5ML PO SYRP: 5.0000 mL | ORAL_SOLUTION | Freq: Four times a day (QID) | ORAL | 0 refills | Status: DC | PRN

## 2022-10-26 MED ORDER — AZITHROMYCIN 250 MG PO TABS: ORAL_TABLET | ORAL | 0 refills | Status: DC

## 2022-10-26 NOTE — ED Triage Notes (Signed)
Pt here for cough and nasal congestion x 1 week 

## 2022-10-26 NOTE — ED Provider Notes (Signed)
[facility]    CSN: [id number] Arrival date & time: 10/26/22  0911      History   Chief Complaint Chief Complaint  Patient presents with   Cough    I have had a sore throat, cough, congestion in nose and chest since Sunday, 5/19. Congestion is yellow/green and has a thick consistency. - Entered by patient    HPI [name] is a 37 y.o. female.   HPI Patient presents with concern for possible sinus infection. Onset: 7 days. This is a recurrent problem that patient reports occurring at least 1-2 times per year with seasonal changes .  Patient endorses  facial pressure, frontal headache, ear pressure, copious mucus. Denies fever, ST, chest tightness, eye irritation,  or GI symptoms. Attempted relief with OTC medication without any relief of symptoms. Patient is a nonsmoker.  Remainder of Review of Systems negative except as noted in the HPI.     Past Medical History:  Diagnosis Date   Depression with anxiety    Guillain-Barre (HCC) 12/2007   Hyperlipemia    Migraine    Vertigo     Patient Active Problem List   Diagnosis Date Noted   Chronic migraine 01/15/2018    Past Surgical History:  Procedure Laterality Date   ANKLE SURGERY Left     OB History   No obstetric history on file.      Home Medications    Prior to Admission medications   Medication Sig Start Date End Date Taking? Authorizing Provider  azithromycin (ZITHROMAX) 250 MG tablet Take 2 tabs PO x 1 dose, then 1 tab PO QD x 4 days 10/26/22  Yes Bing Neighbors, NP  promethazine-dextromethorphan (PROMETHAZINE-DM) 6.25-15 MG/5ML syrup Take 5 mLs by mouth 4 (four) times daily as needed for cough (congestion). 10/26/22  Yes Bing Neighbors, NP  AIMOVIG 140 MG/ML SOAJ Inject into the skin. 06/24/22   [provider]  amphetamine-dextroamphetamine (ADDERALL) 10 MG tablet Take 1/2-1 tab po qd 08/19/22   Corie Chiquito, PMHNP  amphetamine-dextroamphetamine (ADDERALL) 10 MG tablet  Take 1/2-1 tab po qd 09/16/22   Corie Chiquito, PMHNP  amphetamine-dextroamphetamine (ADDERALL) 10 MG tablet Take 0.5-1 tablets (5-10 mg total) by mouth daily. 10/03/22   Corie Chiquito, PMHNP  atorvastatin (LIPITOR) 20 MG tablet Take 20 mg by mouth daily.    [provider]  buPROPion (WELLBUTRIN XL) 300 MG 24 hr tablet Take 1 tablet (300 mg total) by mouth daily. 07/22/22   Corie Chiquito, PMHNP  cetirizine (ZYRTEC) 10 MG tablet Take 10 mg by mouth daily.    [provider]  Cholecalciferol (VITAMIN D3) 125 MCG (5000 UT) CAPS Take by mouth.    [provider]  diphenoxylate-atropine (LOMOTIL) 2.5-0.025 MG tablet Take 1 tablet by mouth 4 (four) times daily as needed for diarrhea or loose stools. 10/02/22   White, Elita Boone, NP  Erenumab-aooe (AIMOVIG) 140 MG/ML SOAJ Inject 140 mg subcutaneously every 28 (twenty-eight) days 09/09/22     fenofibrate 54 MG tablet Take 108 mg by mouth daily.    [provider]  lisdexamfetamine (VYVANSE) 40 MG capsule Take 1 capsule (40 mg total) by mouth every morning. 09/16/22   Corie Chiquito, PMHNP  lisdexamfetamine (VYVANSE) 40 MG capsule Take 1 capsule (40 mg total) by mouth every morning. 10/09/22   Corie Chiquito, PMHNP  lisdexamfetamine (VYVANSE) 40 MG capsule Take 1 capsule (40 mg total) by mouth every morning. 11/06/22   Corie Chiquito, PMHNP  LORazepam (ATIVAN) 0.5  MG tablet Take 1 tablet (0.5 mg total) by mouth 2 (two) times daily as needed. 12/18/21   Corie Chiquito, PMHNP  MAGNESIUM PO Take by mouth.    [provider]  MELATONIN PO Take by mouth at bedtime as needed.    [provider]  Multiple Vitamin (MULTIVITAMIN WITH MINERALS) TABS tablet Take 1 tablet by mouth daily.    [provider]  ondansetron (ZOFRAN-ODT) 4 MG disintegrating tablet Take 1 tablet (4 mg total) by mouth every 8 (eight) hours as needed for nausea or vomiting. 10/02/22   Valinda Hoar, NP  Probiotic Product (PROBIOTIC PO)  Take 1 tablet by mouth daily.     [provider]  promethazine (PHENERGAN) 12.5 MG tablet Take 12.5 mg by mouth every 6 (six) hours as needed. for nausea 07/29/18   [provider]  psyllium (REGULOID) 0.52 g capsule Take 0.52 g by mouth daily.    [provider]  Rimegepant Sulfate (NURTEC) 75 MG TBDP Take by mouth daily as needed. 08/30/20   [provider]  Rimegepant Sulfate (NURTEC) 75 MG TBDP Take 1 tablet (75 mg total) by mouth once daily as needed for up to 90 days 09/09/22     Semaglutide-Weight Management (WEGOVY) 1 MG/0.5ML SOAJ Inject 1 mg into the skin once a week. 07/12/22     Semaglutide-Weight Management (WEGOVY) 2.4 MG/0.75ML SOAJ Inject 2.4 mg into the skin once a week. 07/22/22     sertraline (ZOLOFT) 100 MG tablet Take 2 tablets (200 mg total) by mouth daily. 07/22/22   Corie Chiquito, PMHNP  tiZANidine (ZANAFLEX) 4 MG tablet Take 1 tablet (4 mg total) by mouth every 6 (six) hours as needed for muscle spasms. 04/06/18   Levert Feinstein, MD  zolmitriptan (ZOMIG) 5 MG tablet Take 5 mg by mouth 2 (two) times daily as needed for migraine. Patient not taking: Reported on 07/22/2022 07/29/18   [provider]    Family History Family History  Problem Relation Age of Onset   Hyperlipidemia Mother    Breast cancer Mother    Hyperlipidemia Father    Melanoma Father    Depression Father    Anxiety disorder Father    Dementia Father    Diabetes Maternal Grandmother    Bipolar disorder Niece    ADD / ADHD Niece    Anxiety disorder Sister    Depression Sister    Anxiety disorder Half-Sister    Depression Half-Sister    ADD / ADHD Half-Sister     Social History Social History   Tobacco Use   Smoking status: Never   Smokeless tobacco: Never  Vaping Use   Vaping Use: Never used  Substance Use Topics   Alcohol use: Yes    Comment: 1-2 drinks per month   Drug use: No     Allergies   Amoxicillin and Ceftin [cefuroxime]   Review of  Systems Review of Systems Pertinent negatives listed in HPI    Physical Exam Triage Vital Signs ED Triage Vitals  Enc Vitals Group     BP      Pulse      Resp      Temp      Temp src      SpO2      Weight      Height      Head Circumference      Peak Flow      Pain Score      Pain Loc  Pain Edu?      Excl. in GC?    No data found.  Updated Vital Signs BP 135/84 (BP Location: Left Arm)   Pulse (!) 101   Temp 98.8 F (37.1 C) (Oral)   Resp 18   SpO2 97%   Visual Acuity Right Eye Distance:   Left Eye Distance:   Bilateral Distance:    Right Eye Near:   Left Eye Near:    Bilateral Near:     Physical Exam General Appearance:    Alert, cooperative, no distress  HENT:  Normocephalic, TMs visible without effusion, nares patent with swollen and enlarged erythematous turbinates, negative for pharyngeal swelling  Eyes:    PERRL, conjunctiva/corneas clear, EOM's intact       Lungs:     Clear to auscultation bilaterally, respirations unlabored  Heart:    Regular rate and rhythm  Neurologic:   Awake, alert, oriented x 3. No apparent focal neurological           defect.         UC Treatments / Results  Labs (all labs ordered are listed, but only abnormal results are displayed) Labs Reviewed - No data to display  EKG   Radiology No results found.  Procedures Procedures (including critical care time)  Medications Ordered in UC Medications - No data to display  Initial Impression / Assessment and Plan / UC Course  I have reviewed the triage vital signs and the nursing notes.  Pertinent labs & imaging results that were available during my care of the patient were reviewed by me and considered in my medical decision making (see chart for details).    Start azithromycin as prescribed.  Promethazine DM for cough.  Encouraged hydration along with over-the-counter allergy medication for nasal symptoms.  Return precautions given if symptoms worsen or do not  improve. Final Clinical Impressions(s) / UC Diagnoses   Final diagnoses:  Acute non-recurrent pansinusitis  Acute cough   Discharge Instructions   None    ED Prescriptions     Medication Sig Dispense Auth. Provider   azithromycin (ZITHROMAX) 250 MG tablet Take 2 tabs PO x 1 dose, then 1 tab PO QD x 4 days 6 tablet Bing Neighbors, NP   promethazine-dextromethorphan (PROMETHAZINE-DM) 6.25-15 MG/5ML syrup Take 5 mLs by mouth 4 (four) times daily as needed for cough (congestion). 180 mL Bing Neighbors, NP      PDMP not reviewed this encounter.   Bing Neighbors, NP 10/26/22 1038

## 2022-11-02 ENCOUNTER — Other Ambulatory Visit: Payer: Self-pay | Admitting: Psychiatry

## 2022-11-02 ENCOUNTER — Other Ambulatory Visit (HOSPITAL_BASED_OUTPATIENT_CLINIC_OR_DEPARTMENT_OTHER): Payer: Self-pay | Admitting: Emergency Medicine

## 2022-11-02 DIAGNOSIS — F900 Attention-deficit hyperactivity disorder, predominantly inattentive type: Secondary | ICD-10-CM

## 2022-11-03 NOTE — Telephone Encounter (Signed)
Due 6/6

## 2022-11-05 ENCOUNTER — Encounter (HOSPITAL_BASED_OUTPATIENT_CLINIC_OR_DEPARTMENT_OTHER): Payer: Self-pay | Admitting: Pharmacist

## 2022-11-05 ENCOUNTER — Other Ambulatory Visit (HOSPITAL_BASED_OUTPATIENT_CLINIC_OR_DEPARTMENT_OTHER): Payer: Self-pay

## 2022-11-05 MED ORDER — PHEXXI 1.8-1-0.4 % VA GEL
VAGINAL | 5 refills | Status: DC
  Filled 2022-11-05: qty 60, 30d supply, fill #0

## 2022-11-06 ENCOUNTER — Other Ambulatory Visit (HOSPITAL_BASED_OUTPATIENT_CLINIC_OR_DEPARTMENT_OTHER): Payer: Self-pay

## 2022-11-07 ENCOUNTER — Other Ambulatory Visit (HOSPITAL_BASED_OUTPATIENT_CLINIC_OR_DEPARTMENT_OTHER): Payer: Self-pay

## 2022-11-07 DIAGNOSIS — Z1231 Encounter for screening mammogram for malignant neoplasm of breast: Secondary | ICD-10-CM | POA: Diagnosis not present

## 2022-11-07 MED ORDER — AMPHETAMINE-DEXTROAMPHETAMINE 10 MG PO TABS
5.0000 mg | ORAL_TABLET | Freq: Every day | ORAL | 0 refills | Status: DC
Start: 2022-11-07 — End: 2022-12-09
  Filled 2022-11-07 – 2022-11-09 (×2): qty 30, 30d supply, fill #0

## 2022-11-09 ENCOUNTER — Other Ambulatory Visit (HOSPITAL_BASED_OUTPATIENT_CLINIC_OR_DEPARTMENT_OTHER): Payer: Self-pay

## 2022-11-11 ENCOUNTER — Other Ambulatory Visit (HOSPITAL_BASED_OUTPATIENT_CLINIC_OR_DEPARTMENT_OTHER): Payer: Self-pay

## 2022-11-12 DIAGNOSIS — H6993 Unspecified Eustachian tube disorder, bilateral: Secondary | ICD-10-CM | POA: Diagnosis not present

## 2022-11-12 DIAGNOSIS — J0101 Acute recurrent maxillary sinusitis: Secondary | ICD-10-CM | POA: Diagnosis not present

## 2022-12-02 ENCOUNTER — Other Ambulatory Visit (HOSPITAL_BASED_OUTPATIENT_CLINIC_OR_DEPARTMENT_OTHER): Payer: Self-pay

## 2022-12-02 ENCOUNTER — Other Ambulatory Visit: Payer: Self-pay

## 2022-12-09 ENCOUNTER — Other Ambulatory Visit (HOSPITAL_BASED_OUTPATIENT_CLINIC_OR_DEPARTMENT_OTHER): Payer: Self-pay

## 2022-12-09 ENCOUNTER — Ambulatory Visit: Payer: BC Managed Care – PPO | Admitting: Psychiatry

## 2022-12-09 ENCOUNTER — Encounter: Payer: Self-pay | Admitting: Psychiatry

## 2022-12-09 DIAGNOSIS — F411 Generalized anxiety disorder: Secondary | ICD-10-CM

## 2022-12-09 DIAGNOSIS — F900 Attention-deficit hyperactivity disorder, predominantly inattentive type: Secondary | ICD-10-CM

## 2022-12-09 DIAGNOSIS — F5081 Binge eating disorder: Secondary | ICD-10-CM

## 2022-12-09 DIAGNOSIS — F3342 Major depressive disorder, recurrent, in full remission: Secondary | ICD-10-CM | POA: Diagnosis not present

## 2022-12-09 DIAGNOSIS — F3341 Major depressive disorder, recurrent, in partial remission: Secondary | ICD-10-CM

## 2022-12-09 MED ORDER — LISDEXAMFETAMINE DIMESYLATE 40 MG PO CAPS
40.0000 mg | ORAL_CAPSULE | ORAL | 0 refills | Status: DC
Start: 2023-01-06 — End: 2023-01-27
  Filled 2023-01-11: qty 30, 30d supply, fill #0

## 2022-12-09 MED ORDER — BUPROPION HCL ER (XL) 300 MG PO TB24
300.0000 mg | ORAL_TABLET | Freq: Every day | ORAL | 1 refills | Status: DC
Start: 2022-12-09 — End: 2023-06-03
  Filled 2022-12-09 – 2022-12-30 (×3): qty 90, 90d supply, fill #0
  Filled 2023-04-18: qty 90, 90d supply, fill #1

## 2022-12-09 MED ORDER — LISDEXAMFETAMINE DIMESYLATE 40 MG PO CAPS
40.0000 mg | ORAL_CAPSULE | ORAL | 0 refills | Status: DC
Start: 2023-02-03 — End: 2023-02-06

## 2022-12-09 MED ORDER — LORAZEPAM 0.5 MG PO TABS
0.5000 mg | ORAL_TABLET | Freq: Two times a day (BID) | ORAL | 0 refills | Status: DC | PRN
Start: 2022-12-09 — End: 2023-06-03
  Filled 2022-12-09 – 2022-12-13 (×2): qty 30, 15d supply, fill #0

## 2022-12-09 MED ORDER — LISDEXAMFETAMINE DIMESYLATE 40 MG PO CAPS
40.0000 mg | ORAL_CAPSULE | ORAL | 0 refills | Status: DC
Start: 2022-12-09 — End: 2023-02-06
  Filled 2022-12-09: qty 30, 30d supply, fill #0

## 2022-12-09 MED ORDER — AMPHETAMINE-DEXTROAMPHETAMINE 10 MG PO TABS
5.0000 mg | ORAL_TABLET | Freq: Every day | ORAL | 0 refills | Status: DC
Start: 2023-01-27 — End: 2023-02-24
  Filled 2023-01-27: qty 30, 30d supply, fill #0

## 2022-12-09 MED ORDER — SERTRALINE HCL 100 MG PO TABS
200.0000 mg | ORAL_TABLET | Freq: Every day | ORAL | 1 refills | Status: DC
Start: 2022-12-09 — End: 2023-06-03
  Filled 2022-12-09 – 2022-12-13 (×2): qty 180, 90d supply, fill #0
  Filled 2023-04-18: qty 180, 90d supply, fill #1

## 2022-12-09 NOTE — Progress Notes (Unsigned)
[name] [id number] 1985-08-25 37 y.o.  Subjective:   Patient ID:  [name] is a 37 y.o. (DOB Oct 06, 1985) female.  Chief Complaint:  Chief Complaint  Patient presents with   Follow-up    ADHD, Anxiety, and depression    HPI [name] presents to the office today for follow-up of ADHD, anxiety, and depression. She reports, "I've been great... stable." She reports that she had some stress towards the end of the year. She reports that her stress has been much less. She reports that she has been able to keep up with a daily routine. "I'm a lot more productive." She has been able to clean and organizing one room at a time. Energy and motivation have been good. She has been trying to go to the gym with a friend. Denies depressed mood. Denies excessive crying and "only cried once this year." Denies any recent panic attacks. Sleep has been "hit or miss." She reports some sleep disturbance with sleep schedule change over the summer. "I am getting sleep... feeling energized and rested." Appetite has been good. She reports that she took [place] and stopped due to side effects. She reports that she has not been binge eating with Vyvanse. Concentration improved with Vyvanse. Denies SI.   Adderall last filled 12/02/22.  Vyvanse last filled 11/11/22.  She reports that she has not needed Ativan prn recently.    Past Psychiatric Medication Trials: Sertraline- Has taken since 2020. Has helped with depression. Initially seemed to help Lexapro- Took in 2015. Increased up to 20 mg. Took for a couple of years. Reports that it was helpful and may have stopped working. Effexor- Prescribed for Vestibular migraines Wellbutrin-Denies side effects. Started in July. Took Wellbutrin XL 300 mg in the past Abilify- Seemed to be effective initially. Ativan- Took as monotherapy in the past and "was a zombie." Clonidine- Ineffective Propranolol-Ineffective Topamax   AIMS    Flowsheet Row Office  Visit from 09/12/2020 in [facility] Office Visit from 07/10/2020 in [facility]  AIMS Total Score 1 0      Flowsheet Row ED from 10/26/2022 in [facility]) ED from 10/02/2022 in [facility]) ED from 05/29/2022 in [facility] Emergency Department at [facility]  C-SSRS RISK CATEGORY No Risk No Risk No Risk        Review of Systems:  Review of Systems  Cardiovascular:  Negative for palpitations.  Musculoskeletal:  Negative for gait problem.  Psychiatric/Behavioral:         Please refer to HPI    Medications: I have reviewed the patient's current medications.  Current Outpatient Medications  Medication Sig Dispense Refill   Cholecalciferol (VITAMIN D3) 125 MCG (5000 UT) CAPS Take by mouth.     AIMOVIG 140 MG/ML SOAJ Inject into the skin.     amphetamine-dextroamphetamine (ADDERALL) 10 MG tablet Take 0.5-1 tablets (5-10 mg total) by mouth daily. 30 tablet 0   amphetamine-dextroamphetamine (ADDERALL) 10 MG tablet Take 1/2-1 tab po qd 30 tablet 0   amphetamine-dextroamphetamine (ADDERALL) 10 MG tablet Take 0.5-1 tablets (5-10 mg total) by mouth daily. 30 tablet 0   atorvastatin (LIPITOR) 20 MG tablet Take 20 mg by mouth daily.     azithromycin (ZITHROMAX) 250 MG tablet Take 2 tabs PO x 1 dose, then 1 tab PO QD x 4 days 6 tablet 0   buPROPion (WELLBUTRIN XL) 300 MG 24  hr tablet Take 1 tablet (300 mg total) by mouth daily. 90 tablet 1   cetirizine (ZYRTEC) 10 MG tablet Take 10 mg by mouth daily.     diphenoxylate-atropine (LOMOTIL) 2.5-0.025 MG tablet Take 1 tablet by mouth 4 (four) times daily as needed for diarrhea or loose stools. 15 tablet 0   Erenumab-aooe (AIMOVIG) 140 MG/ML SOAJ Inject 140 mg subcutaneously every 28 (twenty-eight) days 1 mL 11   fenofibrate 54 MG tablet Take 108 mg by mouth daily.     Lactic Ac-Citric Ac-Pot Bitart (PHEXXI)  1.8-1-0.4 % GEL Insert 1 applicator intravaginally immediately before or up to 1 hour before [facility] act of vaginal intercourse as needed. Insert an additional dose if intercourse does not occur within an hour of insertion. 60 g 5   lisdexamfetamine (VYVANSE) 40 MG capsule Take 1 capsule (40 mg total) by mouth every morning. 30 capsule 0   lisdexamfetamine (VYVANSE) 40 MG capsule Take 1 capsule (40 mg total) by mouth every morning. 30 capsule 0   lisdexamfetamine (VYVANSE) 40 MG capsule Take 1 capsule (40 mg total) by mouth every morning. 30 capsule 0   LORazepam (ATIVAN) 0.5 MG tablet Take 1 tablet (0.5 mg total) by mouth 2 (two) times daily as needed. 30 tablet 0   MAGNESIUM PO Take by mouth.     MELATONIN PO Take by mouth at bedtime as needed.     Multiple Vitamin (MULTIVITAMIN WITH MINERALS) TABS tablet Take 1 tablet by mouth daily.     ondansetron (ZOFRAN-ODT) 4 MG disintegrating tablet Take 1 tablet (4 mg total) by mouth every 8 (eight) hours as needed for nausea or vomiting. 20 tablet 0   Probiotic Product (PROBIOTIC PO) Take 1 tablet by mouth daily.      promethazine (PHENERGAN) 12.5 MG tablet Take 12.5 mg by mouth every 6 (six) hours as needed. for nausea     promethazine-dextromethorphan (PROMETHAZINE-DM) 6.25-15 MG/5ML syrup Take 5 mLs by mouth 4 (four) times daily as needed for cough (congestion). 180 mL 0   psyllium (REGULOID) 0.52 g capsule Take 0.52 g by mouth daily.     Rimegepant Sulfate (NURTEC) 75 MG TBDP Take by mouth daily as needed.     Rimegepant Sulfate (NURTEC) 75 MG TBDP Take 1 tablet (75 mg total) by mouth once daily as needed for up to 90 days 24 tablet 3   Semaglutide-Weight Management (WEGOVY) 1 MG/0.5ML SOAJ Inject 1 mg into the skin once a week. 2 mL 0   Semaglutide-Weight Management (WEGOVY) 2.4 MG/0.75ML SOAJ Inject 2.4 mg into the skin once a week. 3 mL 1   sertraline (ZOLOFT) 100 MG tablet Take 2 tablets (200 mg total) by mouth daily. 180 tablet 1   tiZANidine  (ZANAFLEX) 4 MG tablet Take 1 tablet (4 mg total) by mouth every 6 (six) hours as needed for muscle spasms. 30 tablet 6   zolmitriptan (ZOMIG) 5 MG tablet Take 5 mg by mouth 2 (two) times daily as needed for migraine. (Patient not taking: Reported on 07/22/2022)     No current facility-administered medications for this visit.    Medication Side Effects: None  Allergies:  Allergies  Allergen Reactions   Amoxicillin Other (See Comments)    Numbness and tingling at extremities.  Has patient had a PCN reaction causing immediate rash, facial/tongue/throat swelling, SOB or lightheadedness with hypotension: Yes (rash) Has patient had a PCN reaction causing severe rash involving mucus membranes or skin necrosis: No Has patient had a PCN reaction that  required hospitalization: No Has patient had a PCN reaction occurring within the last 10 years: Yes If all of the above answers are "NO", then may proceed with Cephalosporin use.    Ceftin [Cefuroxime] Other (See Comments)    Tingling, numbness, sweating    Past Medical History:  Diagnosis Date   Depression with anxiety    Guillain-Barre (HCC) 12/2007   Hyperlipemia    Migraine    Vertigo     Past Medical History, Surgical history, Social history, and Family history were reviewed and updated as appropriate.   Please see review of systems for further details on the patient's review from today.   Objective:   Physical Exam:  There were no vitals taken for this visit.  Physical Exam Constitutional:      General: She is not in acute distress. Musculoskeletal:        General: No deformity.  Neurological:     Mental Status: She is alert and oriented to person, place, and time.     Coordination: Coordination normal.  Psychiatric:        Attention and Perception: Attention and perception normal. She does not perceive auditory or visual hallucinations.        Mood and Affect: Mood normal. Mood is not anxious or depressed. Affect is not  labile, blunt, angry or inappropriate.        Speech: Speech normal.        Behavior: Behavior normal.        Thought Content: Thought content normal. Thought content is not paranoid or delusional. Thought content does not include homicidal or suicidal ideation. Thought content does not include homicidal or suicidal plan.        Cognition and Memory: Cognition and memory normal.        Judgment: Judgment normal.     Comments: Insight intact     Lab Review:     Component Value Date/Time   NA 141 11/19/2019 1422   K 3.9 11/19/2019 1422   CL 108 11/19/2019 1422   CO2 20 (L) 11/19/2019 1422   GLUCOSE 99 11/19/2019 1422   BUN 12 11/19/2019 1422   CREATININE 0.82 11/19/2019 1422   CALCIUM 9.6 11/19/2019 1422   PROT 8.1 11/19/2019 1422   ALBUMIN 4.2 11/19/2019 1422   AST 29 11/19/2019 1422   ALT 42 11/19/2019 1422   ALKPHOS 62 11/19/2019 1422   BILITOT 0.4 11/19/2019 1422   GFRNONAA >60 11/19/2019 1422   GFRAA >60 11/19/2019 1422       Component Value Date/Time   WBC 10.3 11/19/2019 1422   WBC 11.0 (H) 11/26/2017 1730   RBC 4.53 11/19/2019 1423   RBC 4.58 11/19/2019 1422   HGB 13.4 11/19/2019 1422   HCT 39.5 11/19/2019 1422   PLT 320 11/19/2019 1422   MCV 86.2 11/19/2019 1422   MCH 29.3 11/19/2019 1422   MCHC 33.9 11/19/2019 1422   RDW 13.8 11/19/2019 1422   LYMPHSABS 2.5 11/19/2019 1422   MONOABS 0.7 11/19/2019 1422   EOSABS 0.2 11/19/2019 1422   BASOSABS 0.0 11/19/2019 1422    No results found for: "POCLITH", "LITHIUM"   No results found for: "PHENYTOIN", "PHENOBARB", "VALPROATE", "CBMZ"   .res Assessment: Plan:    There are no diagnoses linked to this encounter.   Please see After Visit Summary for patient specific instructions.  No future appointments.  No orders of the defined types were placed in this encounter.   -------------------------------

## 2022-12-11 ENCOUNTER — Other Ambulatory Visit: Payer: Self-pay

## 2022-12-11 DIAGNOSIS — R7303 Prediabetes: Secondary | ICD-10-CM | POA: Diagnosis not present

## 2022-12-11 DIAGNOSIS — E559 Vitamin D deficiency, unspecified: Secondary | ICD-10-CM | POA: Diagnosis not present

## 2022-12-11 DIAGNOSIS — E669 Obesity, unspecified: Secondary | ICD-10-CM | POA: Diagnosis not present

## 2022-12-11 DIAGNOSIS — G43109 Migraine with aura, not intractable, without status migrainosus: Secondary | ICD-10-CM | POA: Diagnosis not present

## 2022-12-11 DIAGNOSIS — F329 Major depressive disorder, single episode, unspecified: Secondary | ICD-10-CM | POA: Diagnosis not present

## 2022-12-11 DIAGNOSIS — E782 Mixed hyperlipidemia: Secondary | ICD-10-CM | POA: Diagnosis not present

## 2022-12-11 DIAGNOSIS — Z6834 Body mass index (BMI) 34.0-34.9, adult: Secondary | ICD-10-CM | POA: Diagnosis not present

## 2022-12-11 DIAGNOSIS — Z0000 Encounter for general adult medical examination without abnormal findings: Secondary | ICD-10-CM | POA: Diagnosis not present

## 2022-12-13 ENCOUNTER — Other Ambulatory Visit: Payer: Self-pay

## 2022-12-13 ENCOUNTER — Other Ambulatory Visit (HOSPITAL_BASED_OUTPATIENT_CLINIC_OR_DEPARTMENT_OTHER): Payer: Self-pay

## 2022-12-30 ENCOUNTER — Other Ambulatory Visit (HOSPITAL_BASED_OUTPATIENT_CLINIC_OR_DEPARTMENT_OTHER): Payer: Self-pay

## 2023-01-02 ENCOUNTER — Other Ambulatory Visit (HOSPITAL_BASED_OUTPATIENT_CLINIC_OR_DEPARTMENT_OTHER): Payer: Self-pay

## 2023-01-11 ENCOUNTER — Other Ambulatory Visit (HOSPITAL_BASED_OUTPATIENT_CLINIC_OR_DEPARTMENT_OTHER): Payer: Self-pay

## 2023-01-25 DIAGNOSIS — J069 Acute upper respiratory infection, unspecified: Secondary | ICD-10-CM | POA: Diagnosis not present

## 2023-01-25 DIAGNOSIS — Z20822 Contact with and (suspected) exposure to covid-19: Secondary | ICD-10-CM | POA: Diagnosis not present

## 2023-01-25 DIAGNOSIS — H6992 Unspecified Eustachian tube disorder, left ear: Secondary | ICD-10-CM | POA: Diagnosis not present

## 2023-01-25 DIAGNOSIS — R0981 Nasal congestion: Secondary | ICD-10-CM | POA: Diagnosis not present

## 2023-01-27 ENCOUNTER — Other Ambulatory Visit (HOSPITAL_BASED_OUTPATIENT_CLINIC_OR_DEPARTMENT_OTHER): Payer: Self-pay | Admitting: Emergency Medicine

## 2023-01-27 ENCOUNTER — Other Ambulatory Visit: Payer: Self-pay | Admitting: Psychiatry

## 2023-01-27 ENCOUNTER — Other Ambulatory Visit (HOSPITAL_BASED_OUTPATIENT_CLINIC_OR_DEPARTMENT_OTHER): Payer: Self-pay

## 2023-01-27 ENCOUNTER — Other Ambulatory Visit: Payer: Self-pay

## 2023-01-27 DIAGNOSIS — F5081 Binge eating disorder: Secondary | ICD-10-CM

## 2023-01-27 DIAGNOSIS — F900 Attention-deficit hyperactivity disorder, predominantly inattentive type: Secondary | ICD-10-CM

## 2023-01-27 NOTE — Telephone Encounter (Signed)
Due 9/7

## 2023-02-06 ENCOUNTER — Other Ambulatory Visit (HOSPITAL_BASED_OUTPATIENT_CLINIC_OR_DEPARTMENT_OTHER): Payer: Self-pay

## 2023-02-07 ENCOUNTER — Other Ambulatory Visit (HOSPITAL_BASED_OUTPATIENT_CLINIC_OR_DEPARTMENT_OTHER): Payer: Self-pay

## 2023-02-07 MED ORDER — LISDEXAMFETAMINE DIMESYLATE 40 MG PO CAPS
40.0000 mg | ORAL_CAPSULE | ORAL | 0 refills | Status: DC
  Filled 2023-03-17: qty 30, 30d supply, fill #0

## 2023-02-07 MED ORDER — LISDEXAMFETAMINE DIMESYLATE 40 MG PO CAPS
40.0000 mg | ORAL_CAPSULE | ORAL | 0 refills | Status: DC
Start: 2023-02-08 — End: 2023-06-03
  Filled 2023-02-10: qty 30, 30d supply, fill #0

## 2023-02-07 MED ORDER — LISDEXAMFETAMINE DIMESYLATE 40 MG PO CAPS
40.0000 mg | ORAL_CAPSULE | ORAL | 0 refills | Status: DC
  Filled 2023-04-18: qty 30, 30d supply, fill #0

## 2023-02-10 ENCOUNTER — Other Ambulatory Visit (HOSPITAL_BASED_OUTPATIENT_CLINIC_OR_DEPARTMENT_OTHER): Payer: Self-pay

## 2023-02-24 ENCOUNTER — Other Ambulatory Visit (HOSPITAL_BASED_OUTPATIENT_CLINIC_OR_DEPARTMENT_OTHER): Payer: Self-pay

## 2023-02-24 MED ORDER — AMPHETAMINE-DEXTROAMPHETAMINE 10 MG PO TABS
5.0000 mg | ORAL_TABLET | Freq: Every day | ORAL | 0 refills | Status: DC
Start: 2023-04-21 — End: 2024-01-05
  Filled 2023-06-30: qty 30, 30d supply, fill #0

## 2023-02-24 MED ORDER — LISDEXAMFETAMINE DIMESYLATE 40 MG PO CAPS
40.0000 mg | ORAL_CAPSULE | ORAL | 0 refills | Status: DC
Start: 2023-05-05 — End: 2023-06-03
  Filled 2023-05-24: qty 30, 30d supply, fill #0

## 2023-02-24 MED ORDER — AMPHETAMINE-DEXTROAMPHETAMINE 10 MG PO TABS
5.0000 mg | ORAL_TABLET | Freq: Every day | ORAL | 0 refills | Status: DC
Start: 2023-02-24 — End: 2023-06-03
  Filled 2023-02-24: qty 30, 30d supply, fill #0

## 2023-02-24 MED ORDER — AMPHETAMINE-DEXTROAMPHETAMINE 10 MG PO TABS
5.0000 mg | ORAL_TABLET | Freq: Every day | ORAL | 0 refills | Status: DC
Start: 2023-03-24 — End: 2023-06-03
  Filled 2023-04-18: qty 30, 30d supply, fill #0

## 2023-02-24 MED ORDER — LISDEXAMFETAMINE DIMESYLATE 40 MG PO CAPS
40.0000 mg | ORAL_CAPSULE | ORAL | 0 refills | Status: DC
Start: 2023-06-02 — End: 2024-01-05
  Filled 2023-06-30: qty 30, 30d supply, fill #0

## 2023-03-17 ENCOUNTER — Other Ambulatory Visit (HOSPITAL_BASED_OUTPATIENT_CLINIC_OR_DEPARTMENT_OTHER): Payer: Self-pay

## 2023-03-18 ENCOUNTER — Other Ambulatory Visit (HOSPITAL_BASED_OUTPATIENT_CLINIC_OR_DEPARTMENT_OTHER): Payer: Self-pay

## 2023-03-19 ENCOUNTER — Other Ambulatory Visit: Payer: Self-pay

## 2023-04-16 ENCOUNTER — Encounter: Payer: Self-pay | Admitting: Psychiatry

## 2023-04-18 ENCOUNTER — Other Ambulatory Visit (HOSPITAL_BASED_OUTPATIENT_CLINIC_OR_DEPARTMENT_OTHER): Payer: Self-pay

## 2023-04-26 ENCOUNTER — Other Ambulatory Visit (HOSPITAL_BASED_OUTPATIENT_CLINIC_OR_DEPARTMENT_OTHER): Payer: Self-pay

## 2023-05-24 ENCOUNTER — Other Ambulatory Visit (HOSPITAL_BASED_OUTPATIENT_CLINIC_OR_DEPARTMENT_OTHER): Payer: Self-pay

## 2023-05-26 ENCOUNTER — Other Ambulatory Visit: Payer: Self-pay

## 2023-05-26 ENCOUNTER — Other Ambulatory Visit (HOSPITAL_BASED_OUTPATIENT_CLINIC_OR_DEPARTMENT_OTHER): Payer: Self-pay

## 2023-05-26 DIAGNOSIS — Z6837 Body mass index (BMI) 37.0-37.9, adult: Secondary | ICD-10-CM | POA: Diagnosis not present

## 2023-05-26 DIAGNOSIS — Z130 Encounter for screening for diseases of the blood and blood-forming organs and certain disorders involving the immune mechanism: Secondary | ICD-10-CM | POA: Diagnosis not present

## 2023-05-26 DIAGNOSIS — E7800 Pure hypercholesterolemia, unspecified: Secondary | ICD-10-CM | POA: Diagnosis not present

## 2023-05-26 DIAGNOSIS — Z1329 Encounter for screening for other suspected endocrine disorder: Secondary | ICD-10-CM | POA: Diagnosis not present

## 2023-05-26 DIAGNOSIS — Z131 Encounter for screening for diabetes mellitus: Secondary | ICD-10-CM | POA: Diagnosis not present

## 2023-05-26 DIAGNOSIS — N951 Menopausal and female climacteric states: Secondary | ICD-10-CM | POA: Diagnosis not present

## 2023-05-30 DIAGNOSIS — Z6838 Body mass index (BMI) 38.0-38.9, adult: Secondary | ICD-10-CM | POA: Diagnosis not present

## 2023-05-30 DIAGNOSIS — E7800 Pure hypercholesterolemia, unspecified: Secondary | ICD-10-CM | POA: Diagnosis not present

## 2023-05-30 DIAGNOSIS — N951 Menopausal and female climacteric states: Secondary | ICD-10-CM | POA: Diagnosis not present

## 2023-06-03 ENCOUNTER — Telehealth (INDEPENDENT_AMBULATORY_CARE_PROVIDER_SITE_OTHER): Payer: BC Managed Care – PPO | Admitting: Psychiatry

## 2023-06-03 ENCOUNTER — Encounter: Payer: Self-pay | Admitting: Psychiatry

## 2023-06-03 ENCOUNTER — Other Ambulatory Visit (HOSPITAL_BASED_OUTPATIENT_CLINIC_OR_DEPARTMENT_OTHER): Payer: Self-pay

## 2023-06-03 DIAGNOSIS — Z6838 Body mass index (BMI) 38.0-38.9, adult: Secondary | ICD-10-CM | POA: Diagnosis not present

## 2023-06-03 DIAGNOSIS — F3342 Major depressive disorder, recurrent, in full remission: Secondary | ICD-10-CM

## 2023-06-03 DIAGNOSIS — F411 Generalized anxiety disorder: Secondary | ICD-10-CM | POA: Diagnosis not present

## 2023-06-03 DIAGNOSIS — E7800 Pure hypercholesterolemia, unspecified: Secondary | ICD-10-CM | POA: Diagnosis not present

## 2023-06-03 DIAGNOSIS — F900 Attention-deficit hyperactivity disorder, predominantly inattentive type: Secondary | ICD-10-CM

## 2023-06-03 DIAGNOSIS — F50819 Binge eating disorder, unspecified: Secondary | ICD-10-CM | POA: Diagnosis not present

## 2023-06-03 MED ORDER — AMPHETAMINE-DEXTROAMPHETAMINE 10 MG PO TABS: 5.0000 mg | ORAL_TABLET | Freq: Every day | ORAL | 0 refills | Status: DC

## 2023-06-03 MED ORDER — BUPROPION HCL ER (XL) 300 MG PO TB24
300.0000 mg | ORAL_TABLET | Freq: Every day | ORAL | 1 refills | Status: DC
  Filled 2023-06-03 – 2023-07-21 (×2): qty 90, 90d supply, fill #0
  Filled 2023-10-14 – 2023-10-23 (×2): qty 90, 90d supply, fill #1

## 2023-06-03 MED ORDER — LISDEXAMFETAMINE DIMESYLATE 40 MG PO CAPS: 40.0000 mg | ORAL_CAPSULE | ORAL | 0 refills | Status: DC

## 2023-06-03 MED ORDER — LORAZEPAM 0.5 MG PO TABS
0.5000 mg | ORAL_TABLET | Freq: Two times a day (BID) | ORAL | 1 refills | Status: DC | PRN
  Filled 2023-06-03: qty 30, 15d supply, fill #0

## 2023-06-03 MED ORDER — LISDEXAMFETAMINE DIMESYLATE 40 MG PO CAPS
40.0000 mg | ORAL_CAPSULE | ORAL | 0 refills | Status: DC
  Filled 2023-08-04: qty 30, 30d supply, fill #0

## 2023-06-03 MED ORDER — SERTRALINE HCL 100 MG PO TABS
200.0000 mg | ORAL_TABLET | Freq: Every day | ORAL | 1 refills | Status: DC
  Filled 2023-06-03 – 2023-07-21 (×2): qty 180, 90d supply, fill #0
  Filled 2023-10-14 – 2023-10-23 (×2): qty 180, 90d supply, fill #1

## 2023-06-03 MED ORDER — WEGOVY 0.25 MG/0.5ML ~~LOC~~ SOAJ
0.2500 mg | SUBCUTANEOUS | 0 refills | Status: DC
  Filled 2023-06-03 – 2023-06-05 (×2): qty 2, 28d supply, fill #0

## 2023-06-03 NOTE — Progress Notes (Signed)
 [name] [id number] Apr 18, 1986 37 y.o.  Virtual Visit via Video Note  I connected with pt @ on 06/03/23 at 10:00 AM EST by a video enabled telemedicine application and verified that I am speaking with the correct person using two identifiers.   I discussed the limitations of evaluation and management by telemedicine and the availability of in person appointments. The patient expressed understanding and agreed to proceed.  I discussed the assessment and treatment plan with the patient. The patient was provided an opportunity to ask questions and all were answered. The patient agreed with the plan and demonstrated an understanding of the instructions.   The patient was advised to call back or seek an in-person evaluation if the symptoms worsen or if the condition fails to improve as anticipated.  I provided 29 minutes of non-face-to-face time during this encounter.  The patient was located at home.  The provider was located at home.   Harlene Pepper, PMHNP   Subjective:   Patient ID:  [name] is a 37 y.o. (DOB [birth year]) female.  Chief Complaint:  Chief Complaint  Patient presents with   Follow-up    ADHD, anxiety, and depression    HPI [name] presents for follow-up of ADHD, depression, and anxiety. She reports that she had a former coworker die unexpectedly in November from a brain tumor and this was difficult for her.  She reports some slight seasonal depression and this is much less compared to the past. She has been trying to spend times with friends and do some things. She has done some local travel. She noticed some mild irritability before winter break. She reports that she has some stress and feels that she is managing anxiety well overall. She reports that she rarely takes Ativan  prn. She reports some worry and occasionally thinking of worst-case scenarios. She reports that she is focusing on what she can control. Concentration has been adequate.  She feels that Vyvanse  is helpful for ADHD and feeling overly stimulated. She is able to get things done and stay on top of her work and has routines. Sleeping well overall. Appetite has been good. Denies binge eating. Denies SI.   She takes Vyvanse  daily. She reports taking Adderall on work days.   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 She is teaching at [employer].   Plans to start wellness program today.   Vvyanse last filled 05/24/23. Adderall last filled 04/18/23.  Past Psychiatric Medication Trials: Sertraline - Has taken since 2020. Has helped with depression. Initially seemed to help Lexapro- Took in 2015. Increased up to 20 mg. Took for a couple of years. Reports that it was helpful and may have stopped working. Effexor - Prescribed for Vestibular migraines Wellbutrin -Denies side effects. Started in July. Took Wellbutrin  XL 300 mg in the past Abilify - Seemed to be effective initially. Ativan - Took as monotherapy in the past and was a zombie. Clonidine- Ineffective Propranolol -Ineffective Topamax     Review of Systems:  Review of Systems  Musculoskeletal:  Negative for gait problem.  Neurological:        Improved migraines  Psychiatric/Behavioral:         Please refer to HPI    Vitamin D level was WNL.   Medications: I have reviewed the patient's current medications.  Current Outpatient Medications  Medication Sig Dispense Refill   AIMOVIG  140 MG/ML SOAJ Inject into the skin.     [  START ON 08/26/2023] amphetamine -dextroamphetamine  (ADDERALL) 10 MG tablet Take 1/2-1 tab po qd 30 tablet 0   atorvastatin (LIPITOR) 20 MG tablet Take 20 mg by mouth daily.     cetirizine (ZYRTEC) 10 MG tablet Take 10 mg by mouth daily.     Cholecalciferol (VITAMIN D3) 125 MCG (5000 UT) CAPS Take by mouth.     Erenumab -aooe (AIMOVIG ) 140 MG/ML SOAJ Inject 140 mg subcutaneously every 28 (twenty-eight) days 1 mL 11    fenofibrate 54 MG tablet Take 108 mg by mouth daily.     MAGNESIUM  PO Take by mouth.     MELATONIN PO Take by mouth at bedtime as needed.     Multiple Vitamin (MULTIVITAMIN WITH MINERALS) TABS tablet Take 1 tablet by mouth daily.     ondansetron  (ZOFRAN -ODT) 4 MG disintegrating tablet Take 1 tablet (4 mg total) by mouth every 8 (eight) hours as needed for nausea or vomiting. 20 tablet 0   Probiotic Product (PROBIOTIC PO) Take 1 tablet by mouth daily.      psyllium (REGULOID) 0.52 g capsule Take 0.52 g by mouth daily.     Rimegepant Sulfate  (NURTEC) 75 MG TBDP Take by mouth daily as needed.     Rimegepant Sulfate  (NURTEC) 75 MG TBDP Take 1 tablet (75 mg total) by mouth once daily as needed for up to 90 days 24 tablet 3   tiZANidine  (ZANAFLEX ) 4 MG tablet Take 1 tablet (4 mg total) by mouth every 6 (six) hours as needed for muscle spasms. 30 tablet 6   amphetamine -dextroamphetamine  (ADDERALL) 10 MG tablet Take 0.5-1 tablets (5-10 mg total) by mouth daily. 30 tablet 0   [START ON 07/01/2023] amphetamine -dextroamphetamine  (ADDERALL) 10 MG tablet Take 0.5-1 tablets (5-10 mg total) by mouth daily. 30 tablet 0   [START ON 07/29/2023] amphetamine -dextroamphetamine  (ADDERALL) 10 MG tablet Take 0.5-1 tablets (5-10 mg total) by mouth daily. 30 tablet 0   buPROPion  (WELLBUTRIN  XL) 300 MG 24 hr tablet Take 1 tablet (300 mg total) by mouth daily. 90 tablet 1   diphenoxylate -atropine  (LOMOTIL ) 2.5-0.025 MG tablet Take 1 tablet by mouth 4 (four) times daily as needed for diarrhea or loose stools. (Patient not taking: Reported on 12/09/2022) 15 tablet 0   Lactic Ac-Citric Ac-Pot Bitart (PHEXXI ) 1.8-1-0.4 % GEL Insert 1 applicator intravaginally immediately before or up to 1 hour before EACH act of vaginal intercourse as needed. Insert an additional dose if intercourse does not occur within an hour of insertion. 60 g 5   lisdexamfetamine (VYVANSE ) 40 MG capsule Take 1 capsule (40 mg total) by mouth every morning. 30  capsule 0   [START ON 07/19/2023] lisdexamfetamine (VYVANSE ) 40 MG capsule Take 1 capsule (40 mg total) by mouth every morning. 30 capsule 0   [START ON 08/16/2023] lisdexamfetamine (VYVANSE ) 40 MG capsule Take 1 capsule (40 mg total) by mouth every morning. 30 capsule 0   [START ON 09/13/2023] lisdexamfetamine (VYVANSE ) 40 MG capsule Take 1 capsule (40 mg total) by mouth every morning. 30 capsule 0   [START ON 10/11/2023] lisdexamfetamine (VYVANSE ) 40 MG capsule Take 1 capsule (40 mg total) by mouth every morning. 30 capsule 0   LORazepam  (ATIVAN ) 0.5 MG tablet Take 1 tablet (0.5 mg total) by mouth 2 (two) times daily as needed. 30 tablet 1   sertraline  (ZOLOFT ) 100 MG tablet Take 2 tablets (200 mg total) by mouth daily. 180 tablet 1   zolmitriptan (ZOMIG) 5 MG tablet Take 5 mg by mouth 2 (two) times daily as needed  for migraine. (Patient not taking: Reported on 07/22/2022)     No current facility-administered medications for this visit.    Medication Side Effects: None  Allergies:  Allergies  Allergen Reactions   Amoxicillin Other (See Comments)    Numbness and tingling at extremities.  Has patient had a PCN reaction causing immediate rash, facial/tongue/throat swelling, SOB or lightheadedness with hypotension: Yes (rash) Has patient had a PCN reaction causing severe rash involving mucus membranes or skin necrosis: No Has patient had a PCN reaction that required hospitalization: No Has patient had a PCN reaction occurring within the last 10 years: Yes If all of the above answers are NO, then may proceed with Cephalosporin use.    Ceftin [Cefuroxime] Other (See Comments)    Tingling, numbness, sweating    Past Medical History:  Diagnosis Date   Depression with anxiety    Guillain-Barre (HCC) 12/2007   Hyperlipemia    Migraine    Vertigo     Family History  Problem Relation Age of Onset   Hyperlipidemia Mother    Breast cancer Mother    Hyperlipidemia Father    Melanoma Father     Depression Father    Anxiety disorder Father    Dementia Father    Diabetes Maternal Grandmother    Bipolar disorder Niece    ADD / ADHD Niece    Anxiety disorder Sister    Depression Sister    Anxiety disorder Half-Sister    Depression Half-Sister    ADD / ADHD Half-Sister     Social History   Socioeconomic History   Marital status: Single    Spouse name: Not on file   Number of children: 0   Years of education: college   Highest education level: Bachelor's degree (e.g., BA, AB, BS)  Occupational History   Occupation: Teacher  Tobacco Use   Smoking status: Never   Smokeless tobacco: Never  Vaping Use   Vaping status: Never Used  Substance and Sexual Activity   Alcohol use: Yes    Comment: 1-2 drinks per month   Drug use: No   Sexual activity: Not on file  Other Topics Concern   Not on file  Social History Narrative   Lives at home alone.   Right-handed.   1 cup coffee per day.   Social Drivers of Corporate Investment Banker Strain: Not on file  Food Insecurity: Not on file  Transportation Needs: Not on file  Physical Activity: Not on file  Stress: Not on file  Social Connections: Not on file  Intimate Partner Violence: Not on file    Past Medical History, Surgical history, Social history, and Family history were reviewed and updated as appropriate.   Please see review of systems for further details on the patient's review from today.   Objective:   Physical Exam:  BP 130/86   Physical Exam Neurological:     Mental Status: She is alert and oriented to person, place, and time.     Cranial Nerves: No dysarthria.  Psychiatric:        Attention and Perception: Attention and perception normal.        Mood and Affect: Mood normal.        Speech: Speech normal.        Behavior: Behavior is cooperative.        Thought Content: Thought content normal. Thought content is not paranoid or delusional. Thought content does not include homicidal or suicidal  ideation. Thought content does  not include homicidal or suicidal plan.        Cognition and Memory: Cognition and memory normal.        Judgment: Judgment normal.     Comments: Insight intact     Lab Review:     Component Value Date/Time   NA 141 11/19/2019 1422   K 3.9 11/19/2019 1422   CL 108 11/19/2019 1422   CO2 20 (L) 11/19/2019 1422   GLUCOSE 99 11/19/2019 1422   BUN 12 11/19/2019 1422   CREATININE 0.82 11/19/2019 1422   CALCIUM  9.6 11/19/2019 1422   PROT 8.1 11/19/2019 1422   ALBUMIN 4.2 11/19/2019 1422   AST 29 11/19/2019 1422   ALT 42 11/19/2019 1422   ALKPHOS 62 11/19/2019 1422   BILITOT 0.4 11/19/2019 1422   GFRNONAA >60 11/19/2019 1422   GFRAA >60 11/19/2019 1422       Component Value Date/Time   WBC 10.3 11/19/2019 1422   WBC 11.0 (H) 11/26/2017 1730   RBC 4.53 11/19/2019 1423   RBC 4.58 11/19/2019 1422   HGB 13.4 11/19/2019 1422   HCT 39.5 11/19/2019 1422   PLT 320 11/19/2019 1422   MCV 86.2 11/19/2019 1422   MCH 29.3 11/19/2019 1422   MCHC 33.9 11/19/2019 1422   RDW 13.8 11/19/2019 1422   LYMPHSABS 2.5 11/19/2019 1422   MONOABS 0.7 11/19/2019 1422   EOSABS 0.2 11/19/2019 1422   BASOSABS 0.0 11/19/2019 1422    No results found for: POCLITH, LITHIUM   No results found for: PHENYTOIN, PHENOBARB, VALPROATE, CBMZ   .res Assessment: Plan:   34 minutes spent dedicated to the care of this patient on the date of this encounter to include pre-visit review of records, ordering of medication, post visit documentation, and face-to-face time with the patient discussing response to medications and plan to try to decrease Sertraline  in the summer. Will continue current plan of care since target signs and symptoms are well controlled without any tolerability issues. Continue Sertraline  200 mg daily for anxiety and depression.  Continue Wellbutrin  XL 300 mg daily for depression.  Continue Vyvanse  40 mg daily for ADHD.  Continue Adderall 10 mg daily  for ADHD. Continue Lorazepam  0.5 mg po BID prn anxiety.  Pt to follow-up in 6 months or sooner if clinically indicated.  Requested pt call in 3 months to provide update and request additional scripts.  Patient advised to contact office with any questions, adverse effects, or acute worsening in signs and symptoms.    [name] was seen today for follow-up.  Diagnoses and all orders for this visit:  Attention deficit hyperactivity disorder (ADHD), predominantly inattentive type -     lisdexamfetamine (VYVANSE ) 40 MG capsule; Take 1 capsule (40 mg total) by mouth every morning. -     lisdexamfetamine (VYVANSE ) 40 MG capsule; Take 1 capsule (40 mg total) by mouth every morning. -     lisdexamfetamine (VYVANSE ) 40 MG capsule; Take 1 capsule (40 mg total) by mouth every morning. -     lisdexamfetamine (VYVANSE ) 40 MG capsule; Take 1 capsule (40 mg total) by mouth every morning. -     amphetamine -dextroamphetamine  (ADDERALL) 10 MG tablet; Take 0.5-1 tablets (5-10 mg total) by mouth daily. -     amphetamine -dextroamphetamine  (ADDERALL) 10 MG tablet; Take 0.5-1 tablets (5-10 mg total) by mouth daily.  Binge eating disorder -     lisdexamfetamine (VYVANSE ) 40 MG capsule; Take 1 capsule (40 mg total) by mouth every morning.  Recurrent major depressive disorder, in  full remission (HCC) -     buPROPion  (WELLBUTRIN  XL) 300 MG 24 hr tablet; Take 1 tablet (300 mg total) by mouth daily. -     sertraline  (ZOLOFT ) 100 MG tablet; Take 2 tablets (200 mg total) by mouth daily.  Generalized anxiety disorder -     sertraline  (ZOLOFT ) 100 MG tablet; Take 2 tablets (200 mg total) by mouth daily. -     LORazepam  (ATIVAN ) 0.5 MG tablet; Take 1 tablet (0.5 mg total) by mouth 2 (two) times daily as needed.  Other orders -     amphetamine -dextroamphetamine  (ADDERALL) 10 MG tablet; Take 1/2-1 tab po qd     Please see After Visit Summary for patient specific instructions.  No future appointments.  No orders of  the defined types were placed in this encounter.     -------------------------------

## 2023-06-04 NOTE — L&D Delivery Note (Signed)
 Delivery Note [name] is a 38 y.o. G1 now P1001 who had a spontaneous delivery [redacted]w[redacted]d after IOL for cHTN with superimposed pre-eclampsia.  At 12:06pm, a healthy baby boy was delivered via  (Presentation:vertex ).  APGAR: 6, 8; weight  pending skin-to-skin   Admitted for induction of labor for cHTN with superimposed pre-eclampsia. Induced with cytotec, cervical ripening balloon, AROM, and pitocin. Slow latent phase of labor, then progressed normally. Received epidural for pain management. Pushed for 17 minutes. Baby was delivered without difficulty. Nuchal and body cord reduced after delivery. Baby was placed on maternal abdomen where routine bulb suctioning and stimulation was performed. Delayed cord clamping for 60 seconds. Cord was clamped and cut. Cord blood was collected. Delivery of placenta was spontaneous. Placenta was found to be intact, 3-vessel cord was noted. The fundus was found to be firm. 2nd degree perineal and periurethral lacerations were repaired in the normal sterile fashion with 3-0 vicryl rapide. Estimated blood loss 284cc. Cord gases not sent. Instrument and gauze counts were correct at the end of the procedure. Fundus was firm, bleeding was minimal,  and both mother and baby were doing well when I left the room.  Placenta status: L&D. Anesthesia:  epidural Episiotomy:  none Lacerations: 2nd degree perineal and periurethral lacerations Suture Repair: 3.0 vicryl rapide Est. Blood Loss (mL):  284cc  Mom to [facility] for continuation of magnesium.  Baby to Couplet care / Skin to Skin.  [name] 03/26/2024, 12:41 PM

## 2023-06-05 ENCOUNTER — Other Ambulatory Visit (HOSPITAL_BASED_OUTPATIENT_CLINIC_OR_DEPARTMENT_OTHER): Payer: Self-pay

## 2023-06-10 ENCOUNTER — Other Ambulatory Visit (HOSPITAL_BASED_OUTPATIENT_CLINIC_OR_DEPARTMENT_OTHER): Payer: Self-pay

## 2023-06-17 DIAGNOSIS — Z6837 Body mass index (BMI) 37.0-37.9, adult: Secondary | ICD-10-CM | POA: Diagnosis not present

## 2023-06-24 ENCOUNTER — Other Ambulatory Visit (HOSPITAL_BASED_OUTPATIENT_CLINIC_OR_DEPARTMENT_OTHER): Payer: Self-pay

## 2023-06-24 DIAGNOSIS — Z6837 Body mass index (BMI) 37.0-37.9, adult: Secondary | ICD-10-CM | POA: Diagnosis not present

## 2023-06-24 DIAGNOSIS — F330 Major depressive disorder, recurrent, mild: Secondary | ICD-10-CM | POA: Diagnosis not present

## 2023-06-24 MED ORDER — WEGOVY 0.25 MG/0.5ML ~~LOC~~ SOAJ
0.2500 mg | SUBCUTANEOUS | 0 refills | Status: DC
  Filled 2023-06-30: qty 2, 28d supply, fill #0

## 2023-06-27 ENCOUNTER — Other Ambulatory Visit (HOSPITAL_BASED_OUTPATIENT_CLINIC_OR_DEPARTMENT_OTHER): Payer: Self-pay

## 2023-06-30 ENCOUNTER — Other Ambulatory Visit (HOSPITAL_BASED_OUTPATIENT_CLINIC_OR_DEPARTMENT_OTHER): Payer: Self-pay

## 2023-06-30 ENCOUNTER — Ambulatory Visit: Payer: BC Managed Care – PPO | Admitting: Psychiatry

## 2023-07-01 ENCOUNTER — Other Ambulatory Visit (HOSPITAL_BASED_OUTPATIENT_CLINIC_OR_DEPARTMENT_OTHER): Payer: Self-pay

## 2023-07-01 ENCOUNTER — Other Ambulatory Visit: Payer: Self-pay

## 2023-07-01 DIAGNOSIS — Z6837 Body mass index (BMI) 37.0-37.9, adult: Secondary | ICD-10-CM | POA: Diagnosis not present

## 2023-07-01 DIAGNOSIS — F330 Major depressive disorder, recurrent, mild: Secondary | ICD-10-CM | POA: Diagnosis not present

## 2023-07-01 DIAGNOSIS — E782 Mixed hyperlipidemia: Secondary | ICD-10-CM | POA: Diagnosis not present

## 2023-07-01 MED ORDER — WEGOVY 0.25 MG/0.5ML ~~LOC~~ SOAJ
0.2500 mg | SUBCUTANEOUS | 1 refills | Status: DC
  Filled 2023-07-01: qty 2, 28d supply, fill #0
  Filled 2023-08-04: qty 2, 28d supply, fill #1

## 2023-07-09 DIAGNOSIS — E782 Mixed hyperlipidemia: Secondary | ICD-10-CM | POA: Diagnosis not present

## 2023-07-09 DIAGNOSIS — Z6837 Body mass index (BMI) 37.0-37.9, adult: Secondary | ICD-10-CM | POA: Diagnosis not present

## 2023-07-15 DIAGNOSIS — F909 Attention-deficit hyperactivity disorder, unspecified type: Secondary | ICD-10-CM | POA: Diagnosis not present

## 2023-07-15 DIAGNOSIS — Z6837 Body mass index (BMI) 37.0-37.9, adult: Secondary | ICD-10-CM | POA: Diagnosis not present

## 2023-07-21 ENCOUNTER — Other Ambulatory Visit (HOSPITAL_BASED_OUTPATIENT_CLINIC_OR_DEPARTMENT_OTHER): Payer: Self-pay

## 2023-07-28 ENCOUNTER — Other Ambulatory Visit: Payer: Self-pay

## 2023-08-04 ENCOUNTER — Other Ambulatory Visit (HOSPITAL_BASED_OUTPATIENT_CLINIC_OR_DEPARTMENT_OTHER): Payer: Self-pay

## 2023-08-08 ENCOUNTER — Other Ambulatory Visit (HOSPITAL_BASED_OUTPATIENT_CLINIC_OR_DEPARTMENT_OTHER): Payer: Self-pay

## 2023-08-23 DIAGNOSIS — B353 Tinea pedis: Secondary | ICD-10-CM | POA: Diagnosis not present

## 2023-09-09 DIAGNOSIS — Z113 Encounter for screening for infections with a predominantly sexual mode of transmission: Secondary | ICD-10-CM | POA: Diagnosis not present

## 2023-09-09 DIAGNOSIS — Z3201 Encounter for pregnancy test, result positive: Secondary | ICD-10-CM | POA: Diagnosis not present

## 2023-09-09 DIAGNOSIS — N925 Other specified irregular menstruation: Secondary | ICD-10-CM | POA: Diagnosis not present

## 2023-09-10 DIAGNOSIS — Z3490 Encounter for supervision of normal pregnancy, unspecified, unspecified trimester: Secondary | ICD-10-CM | POA: Insufficient documentation

## 2023-09-22 DIAGNOSIS — Z3481 Encounter for supervision of other normal pregnancy, first trimester: Secondary | ICD-10-CM | POA: Diagnosis not present

## 2023-09-22 DIAGNOSIS — G43009 Migraine without aura, not intractable, without status migrainosus: Secondary | ICD-10-CM | POA: Diagnosis not present

## 2023-09-22 DIAGNOSIS — O3680X0 Pregnancy with inconclusive fetal viability, not applicable or unspecified: Secondary | ICD-10-CM | POA: Diagnosis not present

## 2023-09-22 DIAGNOSIS — Z3480 Encounter for supervision of other normal pregnancy, unspecified trimester: Secondary | ICD-10-CM | POA: Diagnosis not present

## 2023-09-22 DIAGNOSIS — Z3A10 10 weeks gestation of pregnancy: Secondary | ICD-10-CM | POA: Diagnosis not present

## 2023-09-22 DIAGNOSIS — O09511 Supervision of elderly primigravida, first trimester: Secondary | ICD-10-CM | POA: Diagnosis not present

## 2023-09-22 LAB — OB RESULTS CONSOLE GC/CHLAMYDIA
Chlamydia: NEGATIVE
Neisseria Gonorrhea: NEGATIVE

## 2023-09-22 LAB — OB RESULTS CONSOLE RPR: RPR: NONREACTIVE

## 2023-09-22 LAB — HEPATITIS C ANTIBODY: HCV Ab: NEGATIVE

## 2023-09-22 LAB — OB RESULTS CONSOLE RUBELLA ANTIBODY, IGM: Rubella: NON-IMMUNE/NOT IMMUNE

## 2023-09-22 LAB — OB RESULTS CONSOLE ANTIBODY SCREEN: Antibody Screen: NEGATIVE

## 2023-09-22 LAB — OB RESULTS CONSOLE HIV ANTIBODY (ROUTINE TESTING): HIV: NONREACTIVE

## 2023-09-22 LAB — OB RESULTS CONSOLE HEPATITIS B SURFACE ANTIGEN: Hepatitis B Surface Ag: NEGATIVE

## 2023-09-24 DIAGNOSIS — Z789 Other specified health status: Secondary | ICD-10-CM | POA: Insufficient documentation

## 2023-09-30 DIAGNOSIS — O09511 Supervision of elderly primigravida, first trimester: Secondary | ICD-10-CM | POA: Diagnosis not present

## 2023-10-13 DIAGNOSIS — Z8669 Personal history of other diseases of the nervous system and sense organs: Secondary | ICD-10-CM | POA: Insufficient documentation

## 2023-10-13 DIAGNOSIS — O09529 Supervision of elderly multigravida, unspecified trimester: Secondary | ICD-10-CM | POA: Insufficient documentation

## 2023-10-15 ENCOUNTER — Ambulatory Visit: Attending: Obstetrics and Gynecology

## 2023-10-15 DIAGNOSIS — O285 Abnormal chromosomal and genetic finding on antenatal screening of mother: Secondary | ICD-10-CM | POA: Diagnosis not present

## 2023-10-15 DIAGNOSIS — O09511 Supervision of elderly primigravida, first trimester: Secondary | ICD-10-CM | POA: Diagnosis not present

## 2023-10-15 DIAGNOSIS — Z3A13 13 weeks gestation of pregnancy: Secondary | ICD-10-CM | POA: Diagnosis not present

## 2023-10-15 NOTE — Progress Notes (Signed)
 [facility] at [facility] [address] Phone:  [phone]   Fax:  [phone]      Virtual Visit via Video Note  I connected with  [name] on 10/15/23 at  2:30 PM EDT by a video enabled telemedicine application and verified that I am speaking with the correct person using two identifiers.  Location: Patient: At work in a private setting. Provider: Maternal Fetal Care.  Genetic Counseling Clinic Note:   I spoke with 38 y.o. [name] today to discuss her NIPS results. She was referred by Alene Ana, MD. She was accompanied by FOB Rusty who was joining in by phone.   Pregnancy History:    G1P0. EGA: [redacted]w[redacted]d by LMP. EDD: 04/16/2024. Personal history of chronic migraines. Denies other health concerns. Denies bleeding, infections, and fevers in this pregnancy. Denies using tobacco, alcohol, or street drugs in this pregnancy.   Family History:    A three-generation pedigree was created and scanned into Epic under the Media tab.  Breashia reports a strong family history of cancer on her maternal side. She reports her mother was diagnosed with breast cancer in her mid 51s. No known genetic testing was performed.  Riyaq also reports her paternal half sister was diagnosed with breast cancer at 38 yo. Zell reports that she, her affected sister, and two other sisters all received genetic testing for hereditary cancer genes that was negative. She has not met with a cancer genetic counselor. Different types of cancer can have a hereditary component that increases an individual's susceptibility to develop cancer; however, most cancers occur by chance due to a combination of genetics and the environment. I offered a referral to cancer genetics so they can ensure that the testing was comprehensive. She declined at this time but may consider it in the future. Otherwise, Alera is followed by her OB due to her family  history of cancer.  Xariyah reports that her other paternal half sister had a miscarriage to due trisomy (possibly T70) in the fetus. She is not aware of confirmatory diagnostic testing that was performed. Her sister also had a twin pregnancy, with demise of one twin. We reviewed that most aneuploidies occur by chance; however, familial cases can occur. NIPS can screen for major aneuploidies that can be seen during pregnancy.  If Lateya has access to any genetic or medical reports for her family members, we are happy to revisit and reassess recurrence risk.  Maternal ethnicity reported as White and paternal ethnicity reported as White. Denies Ashkenazi Jewish ancestry.  Family history not remarkable for consanguinity, individuals with birth defects, intellectual disability, autism spectrum disorder, multiple spontaneous abortions, still births, or unexplained neonatal death.   Abnormal NIPS:  Vassiliki had two abnormal noninvasive prenatal screening (NIPS) during this pregnancy. The first test performed at [redacted]w[redacted]d returned as high-risk for trisomy 48, trisomy 28, and triploidy due to a low fetal fraction of 1.7%. The lab reported a 1/17 chance for the fetus to be affected with those conditions. The second test performed at [redacted]w[redacted]d gestation returned as insufficient fetal fraction (2.6% fetal fraction). Please see lab reports for details.   NIPS analyzes cell-free DNA originating from the placenta that is found in the maternal blood circulation during pregnancy to provide information regarding the presence or absence of extra DNA for chromosomes 13, 18, and 21 as well as the sex chromosomes. Low fetal fraction has been associated with early gestational  age, high maternal weight, suboptimal sample collection, pregnancies conceived using in vitro fertilization, use of certain medications such as blood thinners, certain maternal health conditions such as autoimmune disorders, small placentas, fetal chromosomal  abnormalities, and normal variation.  To explain the results of the first test, when low fetal fraction is detected, the lab is not able to analyze the chromosome material from the placenta. Therefore, the lab then incorporates other factors, such as maternal weight, maternal age, and gestational age to further assess the risk of adverse conditions to the pregnancy. Based upon the results of the additional analysis, the current pregnancy was reported to be at high risk (1 in 66) for triploidy, trisomy 84, or trisomy 15. The risks for trisomy 9 and monosomy X were unchanged. The lab reports this increased risk due to the known association of those conditions with small placentas and low fetal fractions. We briefly discussed the three conditions that the lab reported the pregnancy is at an increased risk for. Greater than 90% of cases with fetuses affected with trisomy 18, trisomy 49, or triploidy will show signs of the respective condition on ultrasound.   Her second Panorama test also returned as low fetal fraction, but the additional analysis performed by the lab did not flag it as high-risk for the conditions listed above. However, due to the low fetal fraction, the chromosomes were still not analyzed. We discussed that the low fetal fraction may be due to Georganne's increased maternal weight and early gestational age at time of collection. However, since low fetal fractions may also be due to a chromosomal difference in the fetus, we reviewed and offered alternative screening and testing options including repeat NIPS through [facility] or a different laboratory, diagnostic testing through amniocentesis, and monitoring through ultrasounds. The benefits and limitations of each were reviewed including the 1 in 500 risk for miscarriage by amniocentesis. We reviewed that no screen can act as a diagnostic tool, and low-risk NIPS and/or normal ultrasounds do not guarantee a healthy pregnancy. Moreover, other labs that  offer NIPS are not able to screen for triploidy. Sibella elected to have [facility] NIPS drawn Friday 10/17/2023. She declined amniocentesis at the moment but may considering depending on the NIPS and ultrasound results.  Advanced Maternal Age:  We briefly discussed that the chance that a fetus would be affected with a chromosome difference increases with advanced maternal age. The chance that Silena's current pregnancy would be affected with a chromosome difference based on her age alone is approximately 1 in 123 (~0.8%). This means that there is a 122 in 123 (~99.2%) chance that the fetus would not be affected with a chromosome difference based on age alone.   Newborn Screening. The [place]  Newborn Screening (NBS) program will screen all newborn babies for cystic fibrosis, spinal muscular atrophy, hemoglobinopathies, and numerous other conditions.  Previous Testing Completed:  Negative carrier screening: Talmadge previously completed carrier screening. She screened to not be a carrier for the fourteen conditions listed on the [facility] report. Please see report for details. A negative result on carrier screening reduces but does not eliminate the chance of being a carrier.    Plan of Care:   Chryssa elected to have a repeat NIPS drawn this Friday. This will be performed through [facility] due to their AMPLIFY technology. Celesta declined amniocentesis at this time. She may consider it depending on NIPS and ultrasound results. Jassmyn does not yet have an anatomy ultrasound scheduled with her OB office. Her next OB appointment is  on 10/29/2023.   Informed consent was obtained. All questions were answered.   75 minutes were spent on the date of the encounter in service to the patient including preparation, consultation through video chat, discussion of test reports and available next steps, pedigree construction, genetic risk assessment, documentation, and care coordination.    Thank you for sharing in  the care of Elle with us .  Please do not hesitate to contact us  at [phone] if you have any questions.   Georgean Kindle, MS, [facility] Certified Genetic Counselor   Genetic counseling student involved in appointment: No.

## 2023-10-17 ENCOUNTER — Ambulatory Visit: Attending: Obstetrics and Gynecology

## 2023-10-17 DIAGNOSIS — O09512 Supervision of elderly primigravida, second trimester: Secondary | ICD-10-CM | POA: Diagnosis not present

## 2023-10-17 DIAGNOSIS — O09511 Supervision of elderly primigravida, first trimester: Secondary | ICD-10-CM

## 2023-10-17 DIAGNOSIS — O285 Abnormal chromosomal and genetic finding on antenatal screening of mother: Secondary | ICD-10-CM

## 2023-10-23 ENCOUNTER — Other Ambulatory Visit (HOSPITAL_COMMUNITY): Payer: Self-pay

## 2023-10-23 ENCOUNTER — Other Ambulatory Visit (HOSPITAL_BASED_OUTPATIENT_CLINIC_OR_DEPARTMENT_OTHER): Payer: Self-pay

## 2023-10-24 DIAGNOSIS — Z369 Encounter for antenatal screening, unspecified: Secondary | ICD-10-CM | POA: Diagnosis not present

## 2023-10-24 DIAGNOSIS — Z3482 Encounter for supervision of other normal pregnancy, second trimester: Secondary | ICD-10-CM | POA: Diagnosis not present

## 2023-10-24 DIAGNOSIS — R030 Elevated blood-pressure reading, without diagnosis of hypertension: Secondary | ICD-10-CM | POA: Diagnosis not present

## 2023-10-28 ENCOUNTER — Telehealth: Payer: Self-pay

## 2023-10-28 NOTE — Telephone Encounter (Signed)
 I spoke with the patient to return her recent [facility] noninvasive prenatal screening (NIPS) results. The result is low risk, consistent with a female fetus. This screening significantly reduces but does not eliminate the chance that the current pregnancy has Down syndrome (trisomy 61), trisomy 4, trisomy 40, and common sex chromosome conditions. Please see report for details. There are many genetic conditions that cannot be detected by NIPS.  Georgean Kindle, MS, [facility] Certified Genetic Counselor [facility] [phone]

## 2023-10-29 ENCOUNTER — Other Ambulatory Visit: Payer: Self-pay | Admitting: Obstetrics and Gynecology

## 2023-10-29 DIAGNOSIS — R899 Unspecified abnormal finding in specimens from other organs, systems and tissues: Secondary | ICD-10-CM

## 2023-11-12 DIAGNOSIS — E559 Vitamin D deficiency, unspecified: Secondary | ICD-10-CM | POA: Diagnosis not present

## 2023-11-12 DIAGNOSIS — Z0000 Encounter for general adult medical examination without abnormal findings: Secondary | ICD-10-CM | POA: Diagnosis not present

## 2023-11-12 DIAGNOSIS — R7303 Prediabetes: Secondary | ICD-10-CM | POA: Diagnosis not present

## 2023-11-13 DIAGNOSIS — E559 Vitamin D deficiency, unspecified: Secondary | ICD-10-CM | POA: Diagnosis not present

## 2023-11-13 DIAGNOSIS — R7303 Prediabetes: Secondary | ICD-10-CM | POA: Diagnosis not present

## 2023-11-13 DIAGNOSIS — Z0000 Encounter for general adult medical examination without abnormal findings: Secondary | ICD-10-CM | POA: Diagnosis not present

## 2023-11-14 ENCOUNTER — Other Ambulatory Visit (HOSPITAL_BASED_OUTPATIENT_CLINIC_OR_DEPARTMENT_OTHER): Payer: Self-pay

## 2023-11-14 DIAGNOSIS — F909 Attention-deficit hyperactivity disorder, unspecified type: Secondary | ICD-10-CM | POA: Diagnosis not present

## 2023-11-14 DIAGNOSIS — F411 Generalized anxiety disorder: Secondary | ICD-10-CM | POA: Diagnosis not present

## 2023-11-14 DIAGNOSIS — F3342 Major depressive disorder, recurrent, in full remission: Secondary | ICD-10-CM | POA: Diagnosis not present

## 2023-11-14 MED ORDER — SERTRALINE HCL 100 MG PO TABS
100.0000 mg | ORAL_TABLET | Freq: Two times a day (BID) | ORAL | 1 refills | Status: DC
  Filled 2024-02-01: qty 180, 90d supply, fill #0

## 2023-11-14 MED ORDER — BUPROPION HCL ER (XL) 150 MG PO TB24
150.0000 mg | ORAL_TABLET | Freq: Every day | ORAL | 1 refills | Status: AC
  Filled 2024-02-01: qty 90, 90d supply, fill #0

## 2023-11-21 DIAGNOSIS — R030 Elevated blood-pressure reading, without diagnosis of hypertension: Secondary | ICD-10-CM | POA: Diagnosis not present

## 2023-11-21 DIAGNOSIS — Z369 Encounter for antenatal screening, unspecified: Secondary | ICD-10-CM | POA: Diagnosis not present

## 2023-12-01 ENCOUNTER — Ambulatory Visit: Attending: Obstetrics and Gynecology

## 2023-12-01 ENCOUNTER — Ambulatory Visit (HOSPITAL_BASED_OUTPATIENT_CLINIC_OR_DEPARTMENT_OTHER): Admitting: Obstetrics

## 2023-12-01 ENCOUNTER — Other Ambulatory Visit: Payer: Self-pay | Admitting: *Deleted

## 2023-12-01 VITALS — BP 129/77 | HR 111

## 2023-12-01 DIAGNOSIS — O99212 Obesity complicating pregnancy, second trimester: Secondary | ICD-10-CM | POA: Diagnosis not present

## 2023-12-01 DIAGNOSIS — O09522 Supervision of elderly multigravida, second trimester: Secondary | ICD-10-CM | POA: Insufficient documentation

## 2023-12-01 DIAGNOSIS — R899 Unspecified abnormal finding in specimens from other organs, systems and tissues: Secondary | ICD-10-CM | POA: Diagnosis not present

## 2023-12-01 DIAGNOSIS — Z8669 Personal history of other diseases of the nervous system and sense organs: Secondary | ICD-10-CM | POA: Diagnosis not present

## 2023-12-01 DIAGNOSIS — O09529 Supervision of elderly multigravida, unspecified trimester: Secondary | ICD-10-CM

## 2023-12-01 DIAGNOSIS — Z3A20 20 weeks gestation of pregnancy: Secondary | ICD-10-CM | POA: Insufficient documentation

## 2023-12-01 DIAGNOSIS — O285 Abnormal chromosomal and genetic finding on antenatal screening of mother: Secondary | ICD-10-CM

## 2023-12-01 DIAGNOSIS — E669 Obesity, unspecified: Secondary | ICD-10-CM | POA: Diagnosis not present

## 2023-12-01 DIAGNOSIS — O09512 Supervision of elderly primigravida, second trimester: Secondary | ICD-10-CM

## 2023-12-01 DIAGNOSIS — O09292 Supervision of pregnancy with other poor reproductive or obstetric history, second trimester: Secondary | ICD-10-CM

## 2023-12-01 NOTE — Progress Notes (Signed)
 MFM Consult Note  [name] is currently at 20 weeks and 3 days.  She was seen due to advanced maternal age (38 years old) and maternal obesity with a BMI of 40.7.  She denies any significant past medical history and denies any problems in her current pregnancy.    She had a cell free DNA test ([facility] test) earlier in her pregnancy which indicated a low risk for trisomy 31, 8, and 13. A female fetus is predicted.   She was informed that the fetal growth and amniotic fluid level were appropriate for her gestational age.   There were no obvious fetal anomalies noted on today's ultrasound exam.  However, today's exam was limited due to the fetal position and maternal body habitus.  The patient was informed that anomalies may be missed due to technical limitations. If the fetus is in a suboptimal position or maternal habitus is increased, visualization of the fetus in the maternal uterus may be impaired.  The following were discussed during today's consultation:  Advanced maternal age in pregnancy  The increased risk of fetal aneuploidy due to advanced maternal age was discussed.   Due to advanced maternal age, the patient was offered and declined an amniocentesis today for definitive diagnosis of fetal aneuploidy.  She is comfortable with the low risk indicated by her cell free DNA test.  Obesity in pregnancy  Due to maternal obesity, we will continue to follow her with monthly growth ultrasounds throughout her pregnancy.    Weekly fetal testing should be started at around 34 weeks.    She will return in 4 weeks for another ultrasound exam to assess the fetal growth and to complete the views of the fetal anatomy.    The patient stated that all of her questions were answered today.  A total of 30 minutes was spent counseling and coordinating the care for this patient.  Greater than 50% of the time was spent in direct face-to-face contact.

## 2023-12-15 DIAGNOSIS — Z369 Encounter for antenatal screening, unspecified: Secondary | ICD-10-CM | POA: Diagnosis not present

## 2023-12-29 ENCOUNTER — Ambulatory Visit

## 2024-01-05 ENCOUNTER — Ambulatory Visit (HOSPITAL_BASED_OUTPATIENT_CLINIC_OR_DEPARTMENT_OTHER): Attending: Obstetrics and Gynecology | Admitting: Maternal & Fetal Medicine

## 2024-01-05 ENCOUNTER — Ambulatory Visit (HOSPITAL_BASED_OUTPATIENT_CLINIC_OR_DEPARTMENT_OTHER)

## 2024-01-05 VITALS — BP 137/75 | HR 107

## 2024-01-05 DIAGNOSIS — O09292 Supervision of pregnancy with other poor reproductive or obstetric history, second trimester: Secondary | ICD-10-CM | POA: Insufficient documentation

## 2024-01-05 DIAGNOSIS — Z3689 Encounter for other specified antenatal screening: Secondary | ICD-10-CM | POA: Diagnosis not present

## 2024-01-05 DIAGNOSIS — O09512 Supervision of elderly primigravida, second trimester: Secondary | ICD-10-CM

## 2024-01-05 DIAGNOSIS — O99212 Obesity complicating pregnancy, second trimester: Secondary | ICD-10-CM | POA: Insufficient documentation

## 2024-01-05 DIAGNOSIS — E669 Obesity, unspecified: Secondary | ICD-10-CM | POA: Diagnosis not present

## 2024-01-05 DIAGNOSIS — Z3A25 25 weeks gestation of pregnancy: Secondary | ICD-10-CM | POA: Insufficient documentation

## 2024-01-05 DIAGNOSIS — O285 Abnormal chromosomal and genetic finding on antenatal screening of mother: Secondary | ICD-10-CM | POA: Diagnosis not present

## 2024-01-05 DIAGNOSIS — O09529 Supervision of elderly multigravida, unspecified trimester: Secondary | ICD-10-CM

## 2024-01-05 NOTE — Progress Notes (Signed)
 After review, MFM consult with provider is not indicated for today  William Glenn, DO 01/05/2024 4:04 PM  [facility]

## 2024-01-07 NOTE — Addendum Note (Signed)
 Addended by: WILLIAM DELORA SAILOR on: 01/07/2024 09:13 AM   Modules accepted: Level of Service

## 2024-01-20 DIAGNOSIS — Z3480 Encounter for supervision of other normal pregnancy, unspecified trimester: Secondary | ICD-10-CM | POA: Diagnosis not present

## 2024-01-20 DIAGNOSIS — Z23 Encounter for immunization: Secondary | ICD-10-CM | POA: Diagnosis not present

## 2024-01-25 DIAGNOSIS — H6501 Acute serous otitis media, right ear: Secondary | ICD-10-CM | POA: Diagnosis not present

## 2024-01-26 ENCOUNTER — Ambulatory Visit

## 2024-01-26 ENCOUNTER — Other Ambulatory Visit

## 2024-02-01 ENCOUNTER — Other Ambulatory Visit (HOSPITAL_BASED_OUTPATIENT_CLINIC_OR_DEPARTMENT_OTHER): Payer: Self-pay

## 2024-02-01 DIAGNOSIS — H6501 Acute serous otitis media, right ear: Secondary | ICD-10-CM | POA: Diagnosis not present

## 2024-02-03 ENCOUNTER — Ambulatory Visit (HOSPITAL_BASED_OUTPATIENT_CLINIC_OR_DEPARTMENT_OTHER)

## 2024-02-03 ENCOUNTER — Other Ambulatory Visit (HOSPITAL_BASED_OUTPATIENT_CLINIC_OR_DEPARTMENT_OTHER): Payer: Self-pay

## 2024-02-03 ENCOUNTER — Ambulatory Visit: Attending: Obstetrics | Admitting: Obstetrics and Gynecology

## 2024-02-03 ENCOUNTER — Other Ambulatory Visit: Payer: Self-pay | Admitting: *Deleted

## 2024-02-03 VITALS — BP 139/65 | HR 97

## 2024-02-03 DIAGNOSIS — E669 Obesity, unspecified: Secondary | ICD-10-CM | POA: Diagnosis not present

## 2024-02-03 DIAGNOSIS — O09523 Supervision of elderly multigravida, third trimester: Secondary | ICD-10-CM

## 2024-02-03 DIAGNOSIS — O99212 Obesity complicating pregnancy, second trimester: Secondary | ICD-10-CM

## 2024-02-03 DIAGNOSIS — O99213 Obesity complicating pregnancy, third trimester: Secondary | ICD-10-CM

## 2024-02-03 DIAGNOSIS — O09293 Supervision of pregnancy with other poor reproductive or obstetric history, third trimester: Secondary | ICD-10-CM | POA: Diagnosis not present

## 2024-02-03 DIAGNOSIS — O285 Abnormal chromosomal and genetic finding on antenatal screening of mother: Secondary | ICD-10-CM

## 2024-02-03 DIAGNOSIS — O09513 Supervision of elderly primigravida, third trimester: Secondary | ICD-10-CM | POA: Diagnosis not present

## 2024-02-03 DIAGNOSIS — O09529 Supervision of elderly multigravida, unspecified trimester: Secondary | ICD-10-CM

## 2024-02-03 DIAGNOSIS — O358XX0 Maternal care for other (suspected) fetal abnormality and damage, not applicable or unspecified: Secondary | ICD-10-CM

## 2024-02-03 DIAGNOSIS — Z3A29 29 weeks gestation of pregnancy: Secondary | ICD-10-CM

## 2024-02-03 NOTE — Progress Notes (Signed)
 Maternal-Fetal Medicine Consultation Name: [name] MRN: [id number]  G1 P0 at 29w 4d gestation.  Patient is here for fetal growth assessment. - Advanced maternal age.  On cell free fetal DNA screening ([facility]), the risks of fetal aneuploidies are not increased. - Pregravid BMI 40.  Patient does not have gestational diabetes.  Blood pressure today at our office is 139/65 mmHg.  She takes low-dose aspirin prophylaxis.  Ultrasound Fetal growth is appropriate for gestational age.  Amniotic fluid normal good fetal activity seen.  I reassured the patient of normal fetal growth assessment.  Pregravid BMI  Grade 3 obesity is independently associated with increased risk of stillbirth (2.5- to 3-fold), but the absolute risk is very small.  I discussed protocol of weekly antenatal testing from [redacted] weeks gestation until delivery. Ultrasound has limitations in the resolution of ultrasound images and fetal anomalies may be missed.   In the absence of hypertension and diabetes, we should expect good pregnancy outcome. I counseled the patient that stational hypertension/preeclampsia is more common in first pregnancies.  Low-dose aspirin delays or prevents preeclampsia.  I encouraged her to continue aspirin.   Recommendations - An appointment was made for her to return in 5 weeks for fetal growth assessment and BPP. - Follow-up ultrasound may be performed at your office for patient's convenience and she may cancel her next appointment with us .  Consultation including face-to-face (more than 50%) counseling 20 minutes.

## 2024-02-05 DIAGNOSIS — Z369 Encounter for antenatal screening, unspecified: Secondary | ICD-10-CM | POA: Diagnosis not present

## 2024-02-06 ENCOUNTER — Other Ambulatory Visit (HOSPITAL_BASED_OUTPATIENT_CLINIC_OR_DEPARTMENT_OTHER): Payer: Self-pay

## 2024-02-06 MED ORDER — OXYCODONE HCL 5 MG PO TABS
5.0000 mg | ORAL_TABLET | ORAL | 0 refills | Status: DC | PRN
  Filled 2024-02-06: qty 12, 2d supply, fill #0

## 2024-02-12 ENCOUNTER — Other Ambulatory Visit (HOSPITAL_BASED_OUTPATIENT_CLINIC_OR_DEPARTMENT_OTHER): Payer: Self-pay

## 2024-02-12 DIAGNOSIS — Z369 Encounter for antenatal screening, unspecified: Secondary | ICD-10-CM | POA: Diagnosis not present

## 2024-02-12 DIAGNOSIS — O10919 Unspecified pre-existing hypertension complicating pregnancy, unspecified trimester: Secondary | ICD-10-CM | POA: Diagnosis not present

## 2024-02-12 MED ORDER — NIFEDIPINE ER OSMOTIC RELEASE 30 MG PO TB24
30.0000 mg | ORAL_TABLET | Freq: Every day | ORAL | 3 refills | Status: DC
  Filled 2024-02-12: qty 30, 30d supply, fill #0

## 2024-02-17 ENCOUNTER — Other Ambulatory Visit (HOSPITAL_BASED_OUTPATIENT_CLINIC_OR_DEPARTMENT_OTHER): Payer: Self-pay

## 2024-02-17 DIAGNOSIS — O163 Unspecified maternal hypertension, third trimester: Secondary | ICD-10-CM | POA: Diagnosis not present

## 2024-02-17 DIAGNOSIS — I10 Essential (primary) hypertension: Secondary | ICD-10-CM | POA: Diagnosis not present

## 2024-02-17 DIAGNOSIS — Z23 Encounter for immunization: Secondary | ICD-10-CM | POA: Diagnosis not present

## 2024-02-17 DIAGNOSIS — Z3A31 31 weeks gestation of pregnancy: Secondary | ICD-10-CM | POA: Diagnosis not present

## 2024-02-17 DIAGNOSIS — O99213 Obesity complicating pregnancy, third trimester: Secondary | ICD-10-CM | POA: Diagnosis not present

## 2024-02-17 MED ORDER — ABRYSVO 120 MCG/0.5ML IM SOLR: INTRAMUSCULAR | 0 refills | Status: DC

## 2024-02-17 MED ORDER — COMIRNATY 30 MCG/0.3ML IM SUSY: PREFILLED_SYRINGE | INTRAMUSCULAR | 0 refills | Status: DC

## 2024-02-17 MED ORDER — NIFEDIPINE ER OSMOTIC RELEASE 30 MG PO TB24
ORAL_TABLET | ORAL | 3 refills | Status: DC
  Filled 2024-02-17: qty 60, 30d supply, fill #0

## 2024-02-18 DIAGNOSIS — F411 Generalized anxiety disorder: Secondary | ICD-10-CM | POA: Diagnosis not present

## 2024-02-18 DIAGNOSIS — F3342 Major depressive disorder, recurrent, in full remission: Secondary | ICD-10-CM | POA: Diagnosis not present

## 2024-02-18 DIAGNOSIS — F909 Attention-deficit hyperactivity disorder, unspecified type: Secondary | ICD-10-CM | POA: Diagnosis not present

## 2024-02-20 ENCOUNTER — Encounter (HOSPITAL_COMMUNITY): Payer: Self-pay | Admitting: Obstetrics and Gynecology

## 2024-02-20 ENCOUNTER — Observation Stay (HOSPITAL_BASED_OUTPATIENT_CLINIC_OR_DEPARTMENT_OTHER)

## 2024-02-20 ENCOUNTER — Other Ambulatory Visit: Payer: Self-pay

## 2024-02-20 ENCOUNTER — Inpatient Hospital Stay (HOSPITAL_COMMUNITY)
Admission: AD | Admit: 2024-02-20 | Discharge: 2024-02-23 | DRG: 833 | Disposition: A | Discharge status: Home / Self Care | Attending: Obstetrics and Gynecology | Admitting: Obstetrics and Gynecology

## 2024-02-20 DIAGNOSIS — Z3A32 32 weeks gestation of pregnancy: Secondary | ICD-10-CM | POA: Diagnosis not present

## 2024-02-20 DIAGNOSIS — E876 Hypokalemia: Secondary | ICD-10-CM | POA: Diagnosis present

## 2024-02-20 DIAGNOSIS — O09529 Supervision of elderly multigravida, unspecified trimester: Secondary | ICD-10-CM

## 2024-02-20 DIAGNOSIS — O09513 Supervision of elderly primigravida, third trimester: Secondary | ICD-10-CM | POA: Diagnosis not present

## 2024-02-20 DIAGNOSIS — Z79899 Other long term (current) drug therapy: Secondary | ICD-10-CM

## 2024-02-20 DIAGNOSIS — Z833 Family history of diabetes mellitus: Secondary | ICD-10-CM

## 2024-02-20 DIAGNOSIS — O99343 Other mental disorders complicating pregnancy, third trimester: Secondary | ICD-10-CM | POA: Diagnosis not present

## 2024-02-20 DIAGNOSIS — O133 Gestational [pregnancy-induced] hypertension without significant proteinuria, third trimester: Secondary | ICD-10-CM | POA: Diagnosis not present

## 2024-02-20 DIAGNOSIS — Z23 Encounter for immunization: Secondary | ICD-10-CM | POA: Diagnosis not present

## 2024-02-20 DIAGNOSIS — O10919 Unspecified pre-existing hypertension complicating pregnancy, unspecified trimester: Principal | ICD-10-CM | POA: Diagnosis present

## 2024-02-20 DIAGNOSIS — O99213 Obesity complicating pregnancy, third trimester: Secondary | ICD-10-CM | POA: Diagnosis not present

## 2024-02-20 DIAGNOSIS — O09523 Supervision of elderly multigravida, third trimester: Secondary | ICD-10-CM

## 2024-02-20 DIAGNOSIS — E669 Obesity, unspecified: Secondary | ICD-10-CM | POA: Diagnosis not present

## 2024-02-20 DIAGNOSIS — F419 Anxiety disorder, unspecified: Secondary | ICD-10-CM | POA: Diagnosis present

## 2024-02-20 DIAGNOSIS — O10913 Unspecified pre-existing hypertension complicating pregnancy, third trimester: Principal | ICD-10-CM | POA: Diagnosis present

## 2024-02-20 DIAGNOSIS — O99283 Endocrine, nutritional and metabolic diseases complicating pregnancy, third trimester: Secondary | ICD-10-CM | POA: Diagnosis present

## 2024-02-20 DIAGNOSIS — O10013 Pre-existing essential hypertension complicating pregnancy, third trimester: Secondary | ICD-10-CM | POA: Diagnosis not present

## 2024-02-20 DIAGNOSIS — O1092 Unspecified pre-existing hypertension complicating childbirth: Secondary | ICD-10-CM | POA: Diagnosis not present

## 2024-02-20 DIAGNOSIS — O113 Pre-existing hypertension with pre-eclampsia, third trimester: Secondary | ICD-10-CM | POA: Diagnosis not present

## 2024-02-20 LAB — CBC
HCT: 35.5 % — ABNORMAL LOW (ref 36.0–46.0)
Hemoglobin: 12 g/dL (ref 12.0–15.0)
MCH: 28 pg (ref 26.0–34.0)
MCHC: 33.8 g/dL (ref 30.0–36.0)
MCV: 82.8 fL (ref 80.0–100.0)
Platelets: 219 K/uL (ref 150–400)
RBC: 4.29 MIL/uL (ref 3.87–5.11)
RDW: 14.9 % (ref 11.5–15.5)
WBC: 11.7 K/uL — ABNORMAL HIGH (ref 4.0–10.5)
nRBC: 0 % (ref 0.0–0.2)

## 2024-02-20 LAB — COMPREHENSIVE METABOLIC PANEL WITH GFR
ALT: 12 U/L (ref 0–44)
AST: 20 U/L (ref 15–41)
Albumin: 2.7 g/dL — ABNORMAL LOW (ref 3.5–5.0)
Alkaline Phosphatase: 68 U/L (ref 38–126)
Anion gap: 15 (ref 5–15)
BUN: <5 mg/dL — ABNORMAL LOW (ref 6–20)
CO2: 19 mmol/L — ABNORMAL LOW (ref 22–32)
Calcium: 9 mg/dL (ref 8.9–10.3)
Chloride: 102 mmol/L (ref 98–111)
Creatinine, Ser: 0.5 mg/dL (ref 0.44–1.00)
GFR, Estimated: >60 mL/min (ref >60)
Glucose, Bld: 110 mg/dL — ABNORMAL HIGH (ref 70–99)
Potassium: 3.2 mmol/L — ABNORMAL LOW (ref 3.5–5.1)
Sodium: 136 mmol/L (ref 135–145)
Total Bilirubin: 0.4 mg/dL (ref 0.0–1.2)
Total Protein: 6.6 g/dL (ref 6.5–8.1)

## 2024-02-20 LAB — TYPE AND SCREEN
ABO/RH(D): A POS
Antibody Screen: NEGATIVE

## 2024-02-20 LAB — URIC ACID: Uric Acid, Serum: 5.9 mg/dL (ref 2.5–7.1)

## 2024-02-20 LAB — PROTEIN / CREATININE RATIO, URINE
Creatinine, Urine: 55 mg/dL
Total Protein, Urine: <6 mg/dL

## 2024-02-20 MED ORDER — BETAMETHASONE SOD PHOS & ACET 6 (3-3) MG/ML IJ SUSP
12.0000 mg | INTRAMUSCULAR | Status: AC
  Administered 2024-02-20 – 2024-02-21 (×2): 12 mg via INTRAMUSCULAR
  Filled 2024-02-20: qty 5

## 2024-02-20 MED ORDER — DOCUSATE SODIUM 100 MG PO CAPS
100.0000 mg | ORAL_CAPSULE | Freq: Every day | ORAL | Status: DC
  Administered 2024-02-20 – 2024-02-22 (×3): 100 mg via ORAL
  Filled 2024-02-20 (×4): qty 1

## 2024-02-20 MED ORDER — LABETALOL HCL 5 MG/ML IV SOLN
20.0000 mg | Freq: Once | INTRAVENOUS | Status: AC
  Administered 2024-02-20: 20 mg via INTRAVENOUS

## 2024-02-20 MED ORDER — LABETALOL HCL 5 MG/ML IV SOLN
INTRAVENOUS | Status: AC
  Filled 2024-02-20: qty 4

## 2024-02-20 MED ORDER — BUPROPION HCL ER (XL) 150 MG PO TB24
150.0000 mg | ORAL_TABLET | Freq: Every day | ORAL | Status: DC
Start: 2024-02-21 — End: 2024-02-23
  Administered 2024-02-21 – 2024-02-22 (×2): 150 mg via ORAL
  Filled 2024-02-20 (×3): qty 1

## 2024-02-20 MED ORDER — LABETALOL HCL 200 MG PO TABS: 200.0000 mg | ORAL_TABLET | Freq: Three times a day (TID) | ORAL | Status: DC

## 2024-02-20 MED ORDER — LACTATED RINGERS IV SOLN: 125.0000 mL/h | INTRAVENOUS | Status: AC

## 2024-02-20 MED ORDER — CALCIUM CARBONATE ANTACID 500 MG PO CHEW
2.0000 | CHEWABLE_TABLET | ORAL | Status: DC | PRN
  Administered 2024-02-21 – 2024-02-22 (×2): 400 mg via ORAL
  Filled 2024-02-20 (×2): qty 2

## 2024-02-20 MED ORDER — SERTRALINE HCL 50 MG PO TABS
200.0000 mg | ORAL_TABLET | Freq: Every day | ORAL | Status: DC
  Administered 2024-02-21 – 2024-02-22 (×2): 200 mg via ORAL
  Filled 2024-02-20 (×2): qty 4

## 2024-02-20 MED ORDER — PRENATAL MULTIVITAMIN CH
1.0000 | ORAL_TABLET | Freq: Every day | ORAL | Status: DC
  Administered 2024-02-21 – 2024-02-22 (×2): 1 via ORAL
  Filled 2024-02-20 (×2): qty 1

## 2024-02-20 MED ORDER — LABETALOL HCL 200 MG PO TABS
200.0000 mg | ORAL_TABLET | Freq: Three times a day (TID) | ORAL | Status: DC
  Administered 2024-02-20 – 2024-02-22 (×5): 200 mg via ORAL
  Filled 2024-02-20 (×5): qty 1

## 2024-02-20 MED ORDER — ACETAMINOPHEN 325 MG PO TABS
650.0000 mg | ORAL_TABLET | ORAL | Status: DC | PRN
  Administered 2024-02-20: 650 mg via ORAL
  Filled 2024-02-20: qty 2

## 2024-02-20 NOTE — Plan of Care (Signed)

## 2024-02-20 NOTE — Consult Note (Signed)
 MFM Consult Note  [name] is a gravida 1 para 0 currently at 32 weeks and 0 days.  She was seen in consultation at the request of Dr. Diedre due to elevated blood pressures most likely related to gestational hypertension.    The patient reports that severe range blood pressures were noted during her NST in the office this afternoon.    She was started on Procardia  XL 30 mg daily which was then increased to 30 mg twice a day about a week ago.  She reports that she started experiencing intermittent headaches which was relieved with Tylenol  while taking Procardia .  Currently, the patient denies any signs or symptoms of preeclampsia.    Her blood pressure since being admitted in the hospital have been in the 140s to 150s over 70s to 80s range.  Her PIH labs on the admission were all within normal limits.  The total protein noted in her urine was less than 6.  The patient denies any history of elevated blood pressures outside of pregnancy.    Her pregnancy has been complicated by advanced maternal age (38 years old) and maternal obesity with a BMI of 40.  A BPP performed today was 8 out of 8.    The fetus was noted to be in the vertex presentation.    A normal-appearing anterior placenta was noted along with normal amniotic fluid with a total AFI of 10 cm.    The patient had a growth scan performed in our office on September 2 (about 2 weeks ago) that showed an EFW of 3 pounds 9 ounces (78th percentile).  Due to her elevated blood pressures, she is receiving a complete course of antenatal corticosteroids and undergoing a 24-hour urine collection.    As Procardia  is causing her to have mild headaches, she will be switched to on labetalol  200 mg 3 times a day later this evening.  The patient was advised that we will watch what her blood pressures after she has been switched to labetalol  and we will await her 24-hour urine results.    Should her 24-hour urine show less than 300 mg of  protein and therefore she does not meet the criteria for preeclampsia, due to gestational hypertension, delivery will be recommended at 37 weeks.   Should she be diagnosed with preeclampsia based on either severe range blood pressures despite treatment with medication or should her 24-hour urine show more than 300 mg of protein, delivery will be recommended at 34 weeks or greater.  Should the patient remain stable after she has been transition to labetalol , outpatient management with twice-weekly fetal testing and blood pressure checks in your office may be considered.    She will have another growth scan performed in our office in about 2 weeks.    The patient understands that should preeclampsia be ruled out during this admission, women with gestational hypertension may still be at risk for developing preeclampsia later in pregnancy.    Preeclampsia precautions were reviewed today.    Should she be diagnosed with preeclampsia after 34 weeks or greater, delivery will be recommended at that time.    The patient stated that all of her questions were answered today.    A total of 60 minutes was spent counseling and coordinating the care for this patient.

## 2024-02-20 NOTE — H&P (Signed)
 [name] is a 38 y.o. female G1P0 @ [redacted]w[redacted]d with pregnancy complicated by chronic hypertension, presenting for evaluation of worsening chronic hypertension.  Was started on Procardia  XL 30mg  at 30.6 (9/11). On 9/16, office BP was severe initially and then came down to mild rnage on repeat. Her Procardia  Xl 30mg  was increased to BID dosing. She returned for BP check today and BP was 158/82, repeat 160/80. She was sent for direct admission to rule out superimposed preeclampsia.   She reports intermittent headaches since starting Procardia , they resolve with Tylenol . Also reports increased stress, states her father passed 3 weeks ago, her husband had eye procedure last week, and she has to give up her dogs as they are moving.  No vision changes, RUQ pain.  Pregnancy complicated by: Obesity: BMI 40.7, most recent growth with MFM on 9/3 1628 g (3 pounds 9 ounces) 78th percentile. 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  OB History     Gravida  1   Para      Term      Preterm      AB      Living         SAB      IAB      Ectopic      Multiple      Live Births             Past Medical History:  Diagnosis Date   Depression with anxiety    Guillain-Barre (HCC) 12/2007   Hyperlipemia    Migraine    Vertigo    Past Surgical History:  Procedure Laterality Date   ANKLE SURGERY Left    Family History: family history includes ADD / ADHD in her half-sister and niece; Anxiety disorder in her father, half-sister, and sister; Bipolar disorder in her niece; Breast cancer in her mother; Dementia in her father; Depression in her father, half-sister, and sister; Diabetes in her maternal grandmother; Hyperlipidemia in her father and mother; Melanoma in her father. Social History:  reports that she has never smoked. She has never used smokeless tobacco. She reports  that she does not currently use alcohol. She reports that she does not use drugs.     Maternal Diabetes: No Genetic Screening: Normal Maternal Ultrasounds/Referrals: Normal Fetal Ultrasounds or other Referrals:  Referred to Materal Fetal Medicine  Maternal Substance Abuse:  No Significant Maternal Medications:  Zoloft , Wellbutrin  Significant Maternal Lab Results:  None Number of Prenatal Visits:greater than 3 verified prenatal visits Maternal Vaccinations:TDap and Flu Other Comments:  None  Review of Systems as noted in HPI History   Blood pressure (!) 154/76, pulse (!) 112, temperature 98.2 F (36.8 C), temperature source Oral, resp. rate 16, height 5' 6 (1.676 m), weight 117 kg, last menstrual period 07/11/2023, SpO2 97%. Exam Physical Exam   Gen: well appearing CVS: normal pulses Lungs: nonlabored respirations Abd: gravid Ext: no calf edema or tenderness  NST: 160bpm, mod variability, + accels, no decels Toco: quiet  CBC    Component Value Date/Time   WBC 11.7 (H) 02/20/2024 1534   RBC 4.29 02/20/2024 1534   HGB 12.0 02/20/2024 1534   HGB 13.4 11/19/2019 1422   HCT 35.5 (L) 02/20/2024 1534   PLT 219 02/20/2024 1534   PLT 320 11/19/2019 1422   MCV 82.8 02/20/2024 1534   MCH 28.0 02/20/2024 1534   MCHC 33.8 02/20/2024 1534  RDW 14.9 02/20/2024 1534   LYMPHSABS 2.5 11/19/2019 1422   MONOABS 0.7 11/19/2019 1422   EOSABS 0.2 11/19/2019 1422   BASOSABS 0.0 11/19/2019 1422   CMP     Component Value Date/Time   NA 136 02/20/2024 1534   K 3.2 (L) 02/20/2024 1534   CL 102 02/20/2024 1534   CO2 19 (L) 02/20/2024 1534   GLUCOSE 110 (H) 02/20/2024 1534   BUN <5 (L) 02/20/2024 1534   CREATININE 0.50 02/20/2024 1534   CREATININE 0.82 11/19/2019 1422   CALCIUM  9.0 02/20/2024 1534   PROT 6.6 02/20/2024 1534   ALBUMIN 2.7 (L) 02/20/2024 1534   AST 20 02/20/2024 1534   AST 29 11/19/2019 1422   ALT 12 02/20/2024 1534   ALT 42 11/19/2019 1422   ALKPHOS 68  02/20/2024 1534   BILITOT 0.4 02/20/2024 1534   BILITOT 0.4 11/19/2019 1422   GFRNONAA >60 02/20/2024 1534   GFRNONAA >60 11/19/2019 1422    Latest Reference Range & Units 02/20/24 14:43  Protein Creatinine Ratio 0.00 - 0.15 mg/mgCre           Assessment/Plan: 38Y G1P0 @ [redacted]w[redacted]d, worsening chronic hypertension, rule out superimposed preeclampsia Admit to observation Preterm: BMZ course now in case early delivery necessitated, first dose at 1406. NICU currently full, in case of delivery, infant would need to be transferred.  CHTN, r/o severe preeclampsia: admission labs normal and urine P/C negative. Started 24 hour urine protein collection. Patient reports headaches with Procardia  and wishes to switch antihypertensives. Will switch to labetalol  200mg  TID. Advised switching antihypertensive may delay discharge, patient aware. Will continue to titrate PRN. Given mild range Bps since arrival to hospital, will hold on magnesium sulfate unless signs/symptoms of severe preeclampsia and/or additional severe range Bps.  MFM (Dr. Ileana) consulted on admission, requested BPP and agrees with plan.  Anxiety: continue home Wellbutrin  and Zoloft     [name] 02/20/2024, 5:41 PM

## 2024-02-21 DIAGNOSIS — Z23 Encounter for immunization: Secondary | ICD-10-CM | POA: Diagnosis not present

## 2024-02-21 DIAGNOSIS — Z3A32 32 weeks gestation of pregnancy: Secondary | ICD-10-CM | POA: Diagnosis not present

## 2024-02-21 DIAGNOSIS — E876 Hypokalemia: Secondary | ICD-10-CM | POA: Diagnosis present

## 2024-02-21 DIAGNOSIS — Z833 Family history of diabetes mellitus: Secondary | ICD-10-CM | POA: Diagnosis not present

## 2024-02-21 DIAGNOSIS — O99343 Other mental disorders complicating pregnancy, third trimester: Secondary | ICD-10-CM | POA: Diagnosis present

## 2024-02-21 DIAGNOSIS — O1092 Unspecified pre-existing hypertension complicating childbirth: Secondary | ICD-10-CM | POA: Diagnosis present

## 2024-02-21 DIAGNOSIS — O10913 Unspecified pre-existing hypertension complicating pregnancy, third trimester: Secondary | ICD-10-CM | POA: Diagnosis present

## 2024-02-21 DIAGNOSIS — O99213 Obesity complicating pregnancy, third trimester: Secondary | ICD-10-CM | POA: Diagnosis present

## 2024-02-21 DIAGNOSIS — F419 Anxiety disorder, unspecified: Secondary | ICD-10-CM | POA: Diagnosis present

## 2024-02-21 DIAGNOSIS — O99283 Endocrine, nutritional and metabolic diseases complicating pregnancy, third trimester: Secondary | ICD-10-CM | POA: Diagnosis present

## 2024-02-21 DIAGNOSIS — Z79899 Other long term (current) drug therapy: Secondary | ICD-10-CM | POA: Diagnosis not present

## 2024-02-21 LAB — PROTEIN, URINE, 24 HOUR
Collection Interval-UPROT: 24 h
Protein, 24H Urine: 160 mg/d — ABNORMAL HIGH (ref 50–100)
Protein, Urine: 8 mg/dL
Urine Total Volume-UPROT: 2000 mL

## 2024-02-21 MED ORDER — POTASSIUM CHLORIDE CRYS ER 20 MEQ PO TBCR
20.0000 meq | EXTENDED_RELEASE_TABLET | Freq: Two times a day (BID) | ORAL | Status: AC
  Administered 2024-02-21 (×2): 20 meq via ORAL
  Filled 2024-02-21 (×2): qty 1

## 2024-02-21 MED ORDER — COVID-19 MRNA VAC-TRIS(PFIZER) 30 MCG/0.3ML IM SUSY
0.3000 mL | PREFILLED_SYRINGE | Freq: Once | INTRAMUSCULAR | Status: AC
  Administered 2024-02-21: 0.3 mL via INTRAMUSCULAR
  Filled 2024-02-21: qty 0.3

## 2024-02-21 MED ORDER — RSV PRE-FUSION F A&B VAC RCMB 120 MCG/0.5ML IM SOLR
0.5000 mL | Freq: Once | INTRAMUSCULAR | Status: AC
  Administered 2024-02-21: 0.5 mL via INTRAMUSCULAR
  Filled 2024-02-21: qty 0.5

## 2024-02-21 NOTE — Progress Notes (Signed)
 AP PROGRESS NOTE S: +FM, denies VB, LOF, cramping. Denies PreE symptoms including HA, SOB, RUQ pain, N/V. HA has improved since switching to labetalol  protocol  O: BP 138/78 (BP Location: Left Arm)   Pulse 90   Temp 97.8 F (36.6 C) (Oral)   Resp 20   Ht 5' 6 (1.676 m)   Wt 117 kg   LMP 07/11/2023 (Exact Date)   SpO2 98%   BMI 41.64 kg/m  Gen: NAD, sitting up in bed CV: No MRG< mild tachy Abd: gravid, NTTP GU deferred MSK: neg calf TTP/edema Psych/Neuro: grossly WNL  Labs: 12/35.5/219K, K 3.2, Cr 0.50, AST/ALT 20/12, uPC too low, 24hr urine pending  A/P: This is a 37yo G1 @ 32 1/7/HD#2 admitted for worsening CHTN, rule out SI-PreE 1) move to inpatient status 2) Preterm: no ctxs now, NST q-shift. S/p BMTZ x1, next due at 1406 today. S/p MFM consult 3) CHTN: switched from Procardia  BID to Labetalol  200mg  TID 2/2 HA with meds. Has been normotensive overnight. Continue 24hr urine, monitor BP    *Last IV push 2019 labetalol  20mg  9/19    *Last GS 1628 g (3 pounds 9 ounces) 78th percentile on 9/3, next in 2wks    *BPP 8/8 9/19 4) Anxiety: continue home Wellbutrin  and Zoloft  5) Hypokalemia: mild, PO repletion today, rechek labs in AM  Anticipate DC home HD#3-4 pending BMTZ course completion, BP monitoring.

## 2024-02-22 LAB — CBC WITH DIFFERENTIAL/PLATELET
Abs Immature Granulocytes: 0.33 K/uL — ABNORMAL HIGH (ref 0.00–0.07)
Basophils Absolute: 0 K/uL (ref 0.0–0.1)
Basophils Relative: 0 %
Eosinophils Absolute: 0 K/uL (ref 0.0–0.5)
Eosinophils Relative: 0 %
HCT: 35.4 % — ABNORMAL LOW (ref 36.0–46.0)
Hemoglobin: 11.9 g/dL — ABNORMAL LOW (ref 12.0–15.0)
Immature Granulocytes: 2 %
Lymphocytes Relative: 10 %
Lymphs Abs: 1.4 K/uL (ref 0.7–4.0)
MCH: 28.2 pg (ref 26.0–34.0)
MCHC: 33.6 g/dL (ref 30.0–36.0)
MCV: 83.9 fL (ref 80.0–100.0)
Monocytes Absolute: 0.5 K/uL (ref 0.1–1.0)
Monocytes Relative: 3 %
Neutro Abs: 12.2 K/uL — ABNORMAL HIGH (ref 1.7–7.7)
Neutrophils Relative %: 85 %
Platelets: 217 K/uL (ref 150–400)
RBC: 4.22 MIL/uL (ref 3.87–5.11)
RDW: 15.1 % (ref 11.5–15.5)
WBC: 14.4 K/uL — ABNORMAL HIGH (ref 4.0–10.5)
nRBC: 0 % (ref 0.0–0.2)

## 2024-02-22 LAB — COMPREHENSIVE METABOLIC PANEL WITH GFR
ALT: 12 U/L (ref 0–44)
AST: 20 U/L (ref 15–41)
Albumin: 2.8 g/dL — ABNORMAL LOW (ref 3.5–5.0)
Alkaline Phosphatase: 59 U/L (ref 38–126)
Anion gap: 10 (ref 5–15)
BUN: 8 mg/dL (ref 6–20)
CO2: 19 mmol/L — ABNORMAL LOW (ref 22–32)
Calcium: 9.3 mg/dL (ref 8.9–10.3)
Chloride: 107 mmol/L (ref 98–111)
Creatinine, Ser: 0.52 mg/dL (ref 0.44–1.00)
GFR, Estimated: >60 mL/min (ref >60)
Glucose, Bld: 119 mg/dL — ABNORMAL HIGH (ref 70–99)
Potassium: 3.8 mmol/L (ref 3.5–5.1)
Sodium: 136 mmol/L (ref 135–145)
Total Bilirubin: 0.3 mg/dL (ref 0.0–1.2)
Total Protein: 6.6 g/dL (ref 6.5–8.1)

## 2024-02-22 MED ORDER — LABETALOL HCL 200 MG PO TABS
300.0000 mg | ORAL_TABLET | Freq: Three times a day (TID) | ORAL | Status: DC
  Administered 2024-02-22 – 2024-02-23 (×4): 300 mg via ORAL
  Filled 2024-02-22 (×4): qty 1

## 2024-02-22 NOTE — Progress Notes (Signed)
 AP PROGRESS NOTE S: +FM, denies VB, LOF, cramping. Denies PreE symptoms including HA, SOB, RUQ pain, N/V. HA has improved since switching to labetalol  protocol. Reviewed elevated Bps and also reviewed BP goal for rest of pregnancy  O: BP (!) 152/75 (BP Location: Left Arm)   Pulse (!) 106   Temp 98.3 F (36.8 C) (Oral)   Resp 18   Ht 5' 6 (1.676 m)   Wt 117 kg   LMP 07/11/2023 (Exact Date)   SpO2 98%   BMI 41.64 kg/m  Gen: NAD, sitting up in bed CV: No MRG< mild tachy Abd: gravid, NTTP GU deferred MSK: neg calf TTP/edema Psych/Neuro: grossly WNL  Labs: 12/35.5/219K > 11.9/35.4/217K, K 3.2>3.9, Cr 0.50>0.52, AST/ALT 20/12, uPC too low, 24hr urine 160  A/P: This is a 37yo G1 @ 32 1/7/HD#3 admitted for worsening CHTN, rule out SI-PreE 1) move to inpatient status 2) Preterm: no ctxs now, NST q-shift. S/p BMTZ x2. S/p MFM consult 3) CHTN: switched from Procardia  BID to Labetalol  200mg  TID 2/2 HA with meds. Slight elevation as above. 24hr urine WNL (160)    *Last IV push 2019 labetalol  20mg  9/19    *Last GS 1628 g (3 pounds 9 ounces) 78th percentile on 9/3, next in 2wks    *BPP 8/8 9/19    *increase labetalol  to 300mg  TID, monitor 4) Anxiety: continue home Wellbutrin  and Zoloft  5) Hypokalemia: improved after 1 day repletion  Anticipate DC home HD#4 pending BP control.

## 2024-02-23 ENCOUNTER — Other Ambulatory Visit: Payer: Self-pay

## 2024-02-23 ENCOUNTER — Other Ambulatory Visit (HOSPITAL_BASED_OUTPATIENT_CLINIC_OR_DEPARTMENT_OTHER): Payer: Self-pay

## 2024-02-23 ENCOUNTER — Other Ambulatory Visit (HOSPITAL_COMMUNITY): Payer: Self-pay

## 2024-02-23 MED ORDER — LABETALOL HCL 300 MG PO TABS
300.0000 mg | ORAL_TABLET | Freq: Three times a day (TID) | ORAL | 1 refills | Status: DC
  Filled 2024-02-23: qty 90, 30d supply, fill #0

## 2024-02-23 MED ORDER — SERTRALINE HCL 100 MG PO TABS
200.0000 mg | ORAL_TABLET | Freq: Every day | ORAL | 1 refills | Status: AC
  Filled 2024-02-23: qty 40, 20d supply, fill #0

## 2024-02-23 MED ORDER — SERTRALINE HCL 100 MG PO TABS
200.0000 mg | ORAL_TABLET | Freq: Every day | ORAL | 1 refills | Status: DC
  Filled 2024-02-23: qty 40, 20d supply, fill #0

## 2024-02-23 NOTE — Discharge Summary (Addendum)
 Physician Discharge Summary  Patient ID: [name] MRN: [id number] DOB/AGE: 1985/06/22 37 y.o.  Admit date: 02/20/2024 Discharge date: 02/23/2024  Admission Diagnoses: Chronic hypertension affecting pregnancy   Discharge Diagnoses:  Principal Problem:   Chronic hypertension affecting pregnancy   Discharged Condition: stable  Hospital Course: Pt admitted for worsening hypertension in pregnancy despite medication ( procardia  30xl bid). Pt was ruled out for preE with nl labs and HA resolution with switch from procardia  to labetalol . Her BP improved over the course of her stay. She also received BMZ and got an MFM consult with delivery planning on ongoing care well outlined. Pt to follow up closely outpt with BIW fetal testing and BP checkups. Pt is reliable with visits   Consults: maternal fetal medicine  Significant Diagnostic Studies: BPP  Treatments: analgesia: acetaminophen  and BP medication   Discharge Exam: Blood pressure 138/74, pulse 90, temperature 98.2 F (36.8 C), temperature source Oral, resp. rate 17, height 5' 6 (1.676 m), weight 117 kg, last menstrual period 07/11/2023, SpO2 99%.  GEN - NAD SVE - deferred EXT - no homans , mild edema   Disposition: Discharge disposition: 01-Home or Self Care       Discharge Instructions     Activity as tolerated - No restrictions   Complete by: As directed    Call MD for:  difficulty breathing, headache or visual disturbances   Complete by: As directed    Call MD for:  persistant dizziness or light-headedness   Complete by: As directed    Call MD for:  persistant nausea and vomiting   Complete by: As directed    Call MD for:  severe uncontrolled pain   Complete by: As directed    Call MD for:  temperature >100.4   Complete by: As directed    Diet - low sodium heart healthy   Complete by: As directed    Discharge instructions   Complete by: As directed    Call office with any concerns [phone]       Allergies as of 02/23/2024       Reactions   Amoxicillin Other (See Comments)   Numbness and tingling at extremities.  Has patient had a PCN reaction causing immediate rash, facial/tongue/throat swelling, SOB or lightheadedness with hypotension: Yes (rash) Has patient had a PCN reaction causing severe rash involving mucus membranes or skin necrosis: No Has patient had a PCN reaction that required hospitalization: No Has patient had a PCN reaction occurring within the last 10 years: Yes If all of the above answers are NO, then may proceed with Cephalosporin use.   Ceftin [cefuroxime] Other (See Comments)   Tingling, numbness, sweating        Medication List     STOP taking these medications    NIFEdipine  30 MG 24 hr tablet Commonly known as: Procardia  XL   ondansetron  4 MG disintegrating tablet Commonly known as: ZOFRAN -ODT       TAKE these medications    Abrysvo  120 MCG/0.5ML injection Generic drug: RSV bivalent vaccine INJECT 0.5 MILLILITER (120 MCG) BY INTRAMUSCULAR ROUTE ONCE   aspirin EC 81 MG tablet Take 81 mg by mouth daily. Swallow whole.   buPROPion  150 MG 24 hr tablet Commonly known as: Wellbutrin  XL Take 1 tablet (150 mg total) by mouth daily. What changed: Another medication with the same name was removed. Continue taking this medication, and follow the directions you see here.   cetirizine 10 MG tablet Commonly known as: ZYRTEC Take  10 mg by mouth daily.   Comirnaty  syringe Generic drug: COVID-19 mRNA vaccine (Pfizer) INJECT 0.3 MILLILITER (30 MCG) BY INTRAMUSCULAR ROUTE ONCE   dexamethasone  6 MG tablet Commonly known as: DECADRON  Take 6 mg by mouth daily.   labetalol  300 MG tablet Commonly known as: NORMODYNE  Take 1 tablet (300 mg total) by mouth 3 (three) times daily.   MAGNESIUM 27 PO Take 1 tablet by mouth daily.   MAGNESIUM PO Take by mouth.   PRENATAL VITAMINS PO Take 1 tablet by mouth daily.   PROBIOTIC PO Take 1 tablet by  mouth daily.   psyllium 0.52 g capsule Commonly known as: REGULOID Take 0.52 g by mouth daily.   sertraline  100 MG tablet Commonly known as: ZOLOFT  Take 2 tablets (200 mg total) by mouth daily. What changed:  how much to take when to take this   VITAMIN D (CHOLECALCIFEROL) PO Take 1 tablet by mouth daily.        Follow-up Information     Ob/Gyn, [name]. Schedule an appointment as soon as possible for a visit.   Why: In 24hrs for BP check + BPP - need BPP/NST and BP check twice weekly till delivery Contact information: [address] [place] [address] [phone]                 Signed: Ted LELON Stone 02/23/2024, 5:07 PM

## 2024-02-23 NOTE — Discharge Instructions (Addendum)
 Call office with any concerns [phone]

## 2024-02-23 NOTE — Plan of Care (Signed)
  Problem: Education: Goal: Knowledge of General Education information will improve Description: Including pain rating scale, medication(s)/side effects and non-pharmacologic comfort measures Outcome: Adequate for Discharge   Problem: Health Behavior/Discharge Planning: Goal: Ability to manage health-related needs will improve Outcome: Adequate for Discharge   Problem: Clinical Measurements: Goal: Ability to maintain clinical measurements within normal limits will improve Outcome: Adequate for Discharge Goal: Will remain free from infection Outcome: Adequate for Discharge Goal: Diagnostic test results will improve Outcome: Adequate for Discharge Goal: Respiratory complications will improve Outcome: Adequate for Discharge Goal: Cardiovascular complication will be avoided Outcome: Adequate for Discharge   Problem: Activity: Goal: Risk for activity intolerance will decrease Outcome: Adequate for Discharge   Problem: Nutrition: Goal: Adequate nutrition will be maintained Outcome: Adequate for Discharge   Problem: Coping: Goal: Level of anxiety will decrease Outcome: Adequate for Discharge   Problem: Elimination: Goal: Will not experience complications related to bowel motility Outcome: Adequate for Discharge Goal: Will not experience complications related to urinary retention Outcome: Adequate for Discharge   Problem: Pain Managment: Goal: General experience of comfort will improve and/or be controlled Outcome: Adequate for Discharge   Problem: Safety: Goal: Ability to remain free from injury will improve Outcome: Adequate for Discharge   Problem: Skin Integrity: Goal: Risk for impaired skin integrity will decrease Outcome: Adequate for Discharge   Problem: Education: Goal: Knowledge of disease or condition will improve Outcome: Adequate for Discharge Goal: Knowledge of the prescribed therapeutic regimen will improve Outcome: Adequate for Discharge Goal:  Individualized Educational Video(s) Outcome: Adequate for Discharge   Problem: Clinical Measurements: Goal: Complications related to the disease process, condition or treatment will be avoided or minimized Outcome: Adequate for Discharge

## 2024-02-23 NOTE — Care Management (Signed)
 Consult placed for medication needs. CM in contact with RN for patient and she shared that consult not needed for Meds to [facility] for pharmacy and CM not needed. CM will sign off. Please call if there are any needs.  Julian Amber RN IP Care Management [phone]

## 2024-02-23 NOTE — Progress Notes (Addendum)
 Patient ID: [name], female   DOB: [birth year], 38 y.o.   MRN: [id number] BP at noon 147/75  Repeat requested later and noted at  134/69.  Rec one more BP prior to discharge - can obtain in two hours.  EFM - 150, moderate variability, + accels, no decels   TOCO - no contractions   SVE deferred    Rec one more BP prior to discharge - can obtain in two hours.

## 2024-02-23 NOTE — Progress Notes (Addendum)
 Patient ID: [name], female   DOB: 10-26-1985, 38 y.o.   MRN: [id number]  Pt reports feels well - had good nights' sleep. Denies any HAs since switch to labetalol . Denies SOB, CP or contractions. Appreciating FMs. She is hoping for discharge to home today  BP ( 12hrs) :  130-148/59-72;         Current : 130/68 HR  wnl   GEN - NAD, ambulating, in good spirits  ABD - cw GA  EXT - mild edema bilaterally only   EFM - 145. Moderate variability, no decels  TOCO - contraction noted x 1 overnight SVE - deferred  NL CMP + CBC 02/22/24, slight elev gluc 119  A/P: HD#4 37yo G1 at 32 2/[redacted]wks gestations admitted for management of worsening chtn - currently stable  Preterm - no signs/symptoms of labor. Continue NSTs q shift. S/P BMZ on 9/19 and 9/20. S/P MFM consult    CHTN - BP better controlled and tolerated on labetalol  300mg  tid ( from procardia  30xl bid). Had SBP of 148 last night but improved from 159 at admission. Neg preE workup ( 24urine + LFTs; nl plts).  - Last IV push of 20mg  labetalol  9/19 @ 2019 - Last GS 9/3: was 1628g ( 3lbs 9oz) 78%ile. Next with MFM in 2 weeks (scheduled)  - Last BPP 9/19 : 8/8 - Per MFM ok for outpt mgmt with well controlled BP. Plan on BIW fetal testing   - Will monitor for at least 12 hrs of well controlled BP and then ok for discharge with close outpt mgmt. Pt agreeable w/ plan. BP q 4hrs ordered   Anxiety - well controlled on wellbutrin  and zoloft    Hypokalemia - resolved on HD#2 after repletion  Delivery planning - per MFM 37wks with good BP control or 34 weeks if BP not well controlled

## 2024-02-24 DIAGNOSIS — Z3A32 32 weeks gestation of pregnancy: Secondary | ICD-10-CM | POA: Diagnosis not present

## 2024-02-24 DIAGNOSIS — O10913 Unspecified pre-existing hypertension complicating pregnancy, third trimester: Secondary | ICD-10-CM | POA: Diagnosis not present

## 2024-03-02 DIAGNOSIS — O163 Unspecified maternal hypertension, third trimester: Secondary | ICD-10-CM | POA: Diagnosis not present

## 2024-03-02 DIAGNOSIS — Z3A33 33 weeks gestation of pregnancy: Secondary | ICD-10-CM | POA: Diagnosis not present

## 2024-03-02 DIAGNOSIS — O99213 Obesity complicating pregnancy, third trimester: Secondary | ICD-10-CM | POA: Diagnosis not present

## 2024-03-05 DIAGNOSIS — O10913 Unspecified pre-existing hypertension complicating pregnancy, third trimester: Secondary | ICD-10-CM | POA: Diagnosis not present

## 2024-03-05 DIAGNOSIS — Z3A34 34 weeks gestation of pregnancy: Secondary | ICD-10-CM | POA: Diagnosis not present

## 2024-03-08 DIAGNOSIS — Z3A34 34 weeks gestation of pregnancy: Secondary | ICD-10-CM | POA: Diagnosis not present

## 2024-03-08 DIAGNOSIS — O163 Unspecified maternal hypertension, third trimester: Secondary | ICD-10-CM | POA: Diagnosis not present

## 2024-03-08 DIAGNOSIS — O99213 Obesity complicating pregnancy, third trimester: Secondary | ICD-10-CM | POA: Diagnosis not present

## 2024-03-09 ENCOUNTER — Ambulatory Visit

## 2024-03-09 ENCOUNTER — Other Ambulatory Visit

## 2024-03-11 DIAGNOSIS — O99213 Obesity complicating pregnancy, third trimester: Secondary | ICD-10-CM | POA: Diagnosis not present

## 2024-03-11 DIAGNOSIS — Z3A34 34 weeks gestation of pregnancy: Secondary | ICD-10-CM | POA: Diagnosis not present

## 2024-03-11 DIAGNOSIS — O163 Unspecified maternal hypertension, third trimester: Secondary | ICD-10-CM | POA: Diagnosis not present

## 2024-03-12 ENCOUNTER — Telehealth (HOSPITAL_COMMUNITY): Payer: Self-pay | Admitting: *Deleted

## 2024-03-12 NOTE — Telephone Encounter (Signed)
 Preadmission screen

## 2024-03-15 DIAGNOSIS — O133 Gestational [pregnancy-induced] hypertension without significant proteinuria, third trimester: Secondary | ICD-10-CM | POA: Diagnosis not present

## 2024-03-15 DIAGNOSIS — O288 Other abnormal findings on antenatal screening of mother: Secondary | ICD-10-CM | POA: Diagnosis not present

## 2024-03-15 DIAGNOSIS — Z3A35 35 weeks gestation of pregnancy: Secondary | ICD-10-CM | POA: Diagnosis not present

## 2024-03-15 DIAGNOSIS — O99213 Obesity complicating pregnancy, third trimester: Secondary | ICD-10-CM | POA: Diagnosis not present

## 2024-03-18 DIAGNOSIS — Z3480 Encounter for supervision of other normal pregnancy, unspecified trimester: Secondary | ICD-10-CM | POA: Diagnosis not present

## 2024-03-18 DIAGNOSIS — Z3A35 35 weeks gestation of pregnancy: Secondary | ICD-10-CM | POA: Diagnosis not present

## 2024-03-18 DIAGNOSIS — O10913 Unspecified pre-existing hypertension complicating pregnancy, third trimester: Secondary | ICD-10-CM | POA: Diagnosis not present

## 2024-03-18 LAB — OB RESULTS CONSOLE GBS: GBS: NEGATIVE

## 2024-03-19 ENCOUNTER — Telehealth (HOSPITAL_COMMUNITY): Payer: Self-pay | Admitting: *Deleted

## 2024-03-19 NOTE — Telephone Encounter (Signed)
 Preadmission screen

## 2024-03-22 ENCOUNTER — Telehealth (HOSPITAL_COMMUNITY): Payer: Self-pay | Admitting: *Deleted

## 2024-03-22 ENCOUNTER — Encounter (HOSPITAL_COMMUNITY): Payer: Self-pay | Admitting: *Deleted

## 2024-03-22 DIAGNOSIS — O10913 Unspecified pre-existing hypertension complicating pregnancy, third trimester: Secondary | ICD-10-CM | POA: Diagnosis not present

## 2024-03-22 DIAGNOSIS — Z3A36 36 weeks gestation of pregnancy: Secondary | ICD-10-CM | POA: Diagnosis not present

## 2024-03-22 DIAGNOSIS — O10919 Unspecified pre-existing hypertension complicating pregnancy, unspecified trimester: Secondary | ICD-10-CM | POA: Diagnosis not present

## 2024-03-22 NOTE — Telephone Encounter (Signed)
 Preadmission screen

## 2024-03-24 ENCOUNTER — Other Ambulatory Visit: Payer: Self-pay

## 2024-03-24 ENCOUNTER — Inpatient Hospital Stay (HOSPITAL_COMMUNITY)
Admission: AD | Admit: 2024-03-24 | Discharge: 2024-03-28 | DRG: 807 | Disposition: A | Discharge status: Home / Self Care

## 2024-03-24 ENCOUNTER — Encounter (HOSPITAL_COMMUNITY): Payer: Self-pay | Admitting: Obstetrics and Gynecology

## 2024-03-24 DIAGNOSIS — O1092 Unspecified pre-existing hypertension complicating childbirth: Secondary | ICD-10-CM | POA: Diagnosis present

## 2024-03-24 DIAGNOSIS — Z3A37 37 weeks gestation of pregnancy: Secondary | ICD-10-CM | POA: Diagnosis not present

## 2024-03-24 DIAGNOSIS — E66813 Obesity, class 3: Secondary | ICD-10-CM | POA: Diagnosis not present

## 2024-03-24 DIAGNOSIS — Z833 Family history of diabetes mellitus: Secondary | ICD-10-CM | POA: Diagnosis not present

## 2024-03-24 DIAGNOSIS — Z3A36 36 weeks gestation of pregnancy: Secondary | ICD-10-CM

## 2024-03-24 DIAGNOSIS — O114 Pre-existing hypertension with pre-eclampsia, complicating childbirth: Principal | ICD-10-CM | POA: Diagnosis present

## 2024-03-24 DIAGNOSIS — Z23 Encounter for immunization: Secondary | ICD-10-CM | POA: Diagnosis not present

## 2024-03-24 DIAGNOSIS — O99214 Obesity complicating childbirth: Secondary | ICD-10-CM | POA: Diagnosis present

## 2024-03-24 DIAGNOSIS — F419 Anxiety disorder, unspecified: Secondary | ICD-10-CM | POA: Diagnosis not present

## 2024-03-24 DIAGNOSIS — O368390 Maternal care for abnormalities of the fetal heart rate or rhythm, unspecified trimester, not applicable or unspecified: Secondary | ICD-10-CM | POA: Diagnosis present

## 2024-03-24 DIAGNOSIS — O10913 Unspecified pre-existing hypertension complicating pregnancy, third trimester: Secondary | ICD-10-CM | POA: Diagnosis not present

## 2024-03-24 DIAGNOSIS — F32A Depression, unspecified: Secondary | ICD-10-CM | POA: Diagnosis present

## 2024-03-24 DIAGNOSIS — O10919 Unspecified pre-existing hypertension complicating pregnancy, unspecified trimester: Principal | ICD-10-CM

## 2024-03-24 DIAGNOSIS — O99344 Other mental disorders complicating childbirth: Secondary | ICD-10-CM | POA: Diagnosis present

## 2024-03-24 LAB — COMPREHENSIVE METABOLIC PANEL WITH GFR
ALT: 18 U/L (ref 0–44)
AST: 23 U/L (ref 15–41)
Albumin: 2.8 g/dL — ABNORMAL LOW (ref 3.5–5.0)
Alkaline Phosphatase: 87 U/L (ref 38–126)
Anion gap: 15 (ref 5–15)
BUN: 12 mg/dL (ref 6–20)
CO2: 19 mmol/L — ABNORMAL LOW (ref 22–32)
Calcium: 9.3 mg/dL (ref 8.9–10.3)
Chloride: 100 mmol/L (ref 98–111)
Creatinine, Ser: 0.73 mg/dL (ref 0.44–1.00)
GFR, Estimated: >60 mL/min (ref >60)
Glucose, Bld: 145 mg/dL — ABNORMAL HIGH (ref 70–99)
Potassium: 3.5 mmol/L (ref 3.5–5.1)
Sodium: 134 mmol/L — ABNORMAL LOW (ref 135–145)
Total Bilirubin: 0.4 mg/dL (ref 0.0–1.2)
Total Protein: 6.8 g/dL (ref 6.5–8.1)

## 2024-03-24 LAB — TYPE AND SCREEN
ABO/RH(D): A POS
Antibody Screen: NEGATIVE

## 2024-03-24 LAB — PROTEIN / CREATININE RATIO, URINE
Creatinine, Urine: 168 mg/dL
Protein Creatinine Ratio: 0.28 mg/mg{creat} — ABNORMAL HIGH (ref 0.00–0.15)
Total Protein, Urine: 47 mg/dL

## 2024-03-24 LAB — CBC
HCT: 38.6 % (ref 36.0–46.0)
Hemoglobin: 12.8 g/dL (ref 12.0–15.0)
MCH: 27.6 pg (ref 26.0–34.0)
MCHC: 33.2 g/dL (ref 30.0–36.0)
MCV: 83.4 fL (ref 80.0–100.0)
Platelets: 240 K/uL (ref 150–400)
RBC: 4.63 MIL/uL (ref 3.87–5.11)
RDW: 15.8 % — ABNORMAL HIGH (ref 11.5–15.5)
WBC: 12.4 K/uL — ABNORMAL HIGH (ref 4.0–10.5)
nRBC: 0 % (ref 0.0–0.2)

## 2024-03-24 MED ORDER — SOD CITRATE-CITRIC ACID 500-334 MG/5ML PO SOLN
30.0000 mL | ORAL | Status: DC | PRN
Start: 2024-03-24 — End: 2024-03-26

## 2024-03-24 MED ORDER — MISOPROSTOL 25 MCG QUARTER TABLET
25.0000 ug | ORAL_TABLET | ORAL | Status: DC | PRN
Start: 2024-03-24 — End: 2024-03-26
  Administered 2024-03-24 – 2024-03-25 (×2): 25 ug via VAGINAL
  Filled 2024-03-24 (×2): qty 1

## 2024-03-24 MED ORDER — LABETALOL HCL 5 MG/ML IV SOLN
20.0000 mg | INTRAVENOUS | Status: DC | PRN
  Administered 2024-03-24: 20 mg via INTRAVENOUS
  Filled 2024-03-24 (×2): qty 4

## 2024-03-24 MED ORDER — LIDOCAINE HCL (PF) 1 % IJ SOLN
30.0000 mL | INTRAMUSCULAR | Status: DC | PRN
Start: 2024-03-24 — End: 2024-03-26

## 2024-03-24 MED ORDER — HYDRALAZINE HCL 20 MG/ML IJ SOLN: 10.0000 mg | INTRAMUSCULAR | Status: DC | PRN

## 2024-03-24 MED ORDER — LACTATED RINGERS IV SOLN: INTRAVENOUS | Status: AC

## 2024-03-24 MED ORDER — TERBUTALINE SULFATE 1 MG/ML IJ SOLN: 0.2500 mg | Freq: Once | INTRAMUSCULAR | Status: DC | PRN

## 2024-03-24 MED ORDER — ACETAMINOPHEN 325 MG PO TABS
650.0000 mg | ORAL_TABLET | ORAL | Status: DC | PRN
  Administered 2024-03-24 – 2024-03-26 (×3): 650 mg via ORAL
  Filled 2024-03-24 (×3): qty 2

## 2024-03-24 MED ORDER — LABETALOL HCL 5 MG/ML IV SOLN: 80.0000 mg | INTRAVENOUS | Status: DC | PRN

## 2024-03-24 MED ORDER — LACTATED RINGERS IV SOLN: 500.0000 mL | INTRAVENOUS | Status: AC | PRN

## 2024-03-24 MED ORDER — OXYTOCIN BOLUS FROM INFUSION
333.0000 mL | Freq: Once | INTRAVENOUS | Status: AC
  Administered 2024-03-26: 333 mL via INTRAVENOUS

## 2024-03-24 MED ORDER — OXYTOCIN-SODIUM CHLORIDE 30-0.9 UT/500ML-% IV SOLN
2.5000 [IU]/h | INTRAVENOUS | Status: DC
  Administered 2024-03-26: 2.5 [IU]/h via INTRAVENOUS
  Filled 2024-03-24: qty 500

## 2024-03-24 MED ORDER — FENTANYL CITRATE (PF) 100 MCG/2ML IJ SOLN
50.0000 ug | INTRAMUSCULAR | Status: DC | PRN
  Administered 2024-03-25: 50 ug via INTRAVENOUS
  Administered 2024-03-25 (×4): 100 ug via INTRAVENOUS
  Filled 2024-03-24 (×5): qty 2

## 2024-03-24 MED ORDER — ONDANSETRON HCL 4 MG/2ML IJ SOLN
4.0000 mg | Freq: Four times a day (QID) | INTRAMUSCULAR | Status: DC | PRN
Start: 2024-03-24 — End: 2024-03-26
  Administered 2024-03-25 – 2024-03-26 (×2): 4 mg via INTRAVENOUS
  Filled 2024-03-24 (×2): qty 2

## 2024-03-24 MED ORDER — LABETALOL HCL 5 MG/ML IV SOLN: 40.0000 mg | INTRAVENOUS | Status: DC | PRN

## 2024-03-24 MED ORDER — OXYCODONE-ACETAMINOPHEN 5-325 MG PO TABS: 1.0000 | ORAL_TABLET | ORAL | Status: DC | PRN

## 2024-03-24 MED ORDER — OXYCODONE-ACETAMINOPHEN 5-325 MG PO TABS: 2.0000 | ORAL_TABLET | ORAL | Status: DC | PRN

## 2024-03-24 NOTE — MAU Note (Signed)
 Pt sent from office for induction for elevated B/P. L&D not able to take her at this time. .  C/O mild headache. Denies abd pain or visual changes. . Good fetal movement reported.

## 2024-03-24 NOTE — Progress Notes (Signed)
 MAU provider was requested by RN to verify presentation.  NST - FHR: 150 bpm / moderate variability / accels present / decels absent / TOCO: no uterine contractions   Patient informed that the ultrasound is considered a limited OB ultrasound and is not intended to be a complete ultrasound exam.  Patient also informed that the ultrasound is not being completed with the intent of assessing for fetal or placental anomalies or any pelvic abnormalities.  Explained that the purpose of today's ultrasound is to assess for presentation.  Patient acknowledges the purpose of the exam and the limitations of the study.  Baby was found to be in a VERTEX presentation.   Pamela DELENA Sear, PA  03/24/2024 7:40 PM

## 2024-03-24 NOTE — H&P (Signed)
 [name] is a 38 y.o. female presenting for induction of labor for BPP of 6 out of 8 in context of worsening chronic hypertension  38 year old G1 P0 at 36+5 presents from her routine prenatal visit for chronic hypertension.  Hypertension diagnosed at 19 weeks of pregnancy.  Patient was started on Procardia  30 XL at 31 weeks.  Her dose was increased to 30 twice daily and she continued to have some severe range blood pressures.  She was admitted to the hospital at 32 weeks for rule out of superimposed preeclampsia.  Her antihypertensive was switched to labetalol  300 mg 3 times daily due to headaches on Procardia .  Overall her blood pressures remained well-controlled however today her blood pressures were elevated with a severe range blood pressure.  A biophysical profile was performed and was 6 out of 8.  Given worsening hypertension in the context of chronic hypertension and BPP of 6 out of 8 decision was made to proceed with induction of labor  Pregnancy problems: 1) advanced maternal age 59) maternal obesity: Initial BMI 40.7 3) low fetal fraction x 2 on NIPT with panorama, repeat with myriad NIPT low risk female 4) chronic hypertension: Labetalol  300 mg 3 times daily 5) rubella nonimmune 6) anxiety: Well-controlled on Wellbutrin  and sertraline  OB History     Gravida  1   Para      Term      Preterm      AB      Living         SAB      IAB      Ectopic      Multiple      Live Births             Past Medical History:  Diagnosis Date   Depression with anxiety    Guillain-Barre 12/2007   Hyperlipemia    Migraine    Pregnancy induced hypertension    Vertigo    Past Surgical History:  Procedure Laterality Date   ANKLE SURGERY Left    Family History: family history includes ADD / ADHD in her half-sister and niece; Anxiety disorder in her father, half-sister, and sister; Bipolar disorder in her niece; Breast cancer in her mother; Dementia in her father; Depression  in her father, half-sister, and sister; Diabetes in her maternal grandmother; Hyperlipidemia in her father and mother; Melanoma in her father. Social History:  reports that she has never smoked. She has never used smokeless tobacco. She reports that she does not currently use alcohol. She reports that she does not use drugs.     Maternal Diabetes: No Genetic Screening: Normal Maternal Ultrasounds/Referrals: Normal Fetal Ultrasounds or other Referrals:  None Maternal Substance Abuse:  No Significant Maternal Medications:  Meds include: Zoloft  Other: Wellbutrin  Significant Maternal Lab Results:  Other: Rubella nonimmune Number of Prenatal Visits:greater than 3 verified prenatal visits Maternal Vaccinations:RSV: Given during pregnancy >/=14 days ago, Tdap, flu Other Comments:  None  Review of Systems History   Blood pressure (!) 155/79, pulse 92, temperature 98.3 F (36.8 C), resp. rate 18, height 5' 6 (1.676 m), weight 119.3 kg, last menstrual period 07/11/2023. Exam Physical Exam  And orient x 3, no apparent distress Obese, gravid, soft Fetal heart rate 150, reactive, category 1 tracing Prenatal labs: ABO, Rh: --/--/A POS (10/22 1715) Antibody: NEG (10/22 1715) Rubella: Nonimmune (04/21 0000) RPR: Nonreactive (04/21 0000)  HBsAg: Negative (04/21 0000)  HIV: Non-reactive (04/21 0000)  GBS:   Negative  Results for  orders placed or performed during the hospital encounter of 03/24/24 (from the past 24 hours)  Comprehensive metabolic panel     Status: Abnormal   Collection Time: 03/24/24  5:15 PM  Result Value Ref Range   Sodium 134 (L) 135 - 145 mmol/L   Potassium 3.5 3.5 - 5.1 mmol/L   Chloride 100 98 - 111 mmol/L   CO2 19 (L) 22 - 32 mmol/L   Glucose, Bld 145 (H) 70 - 99 mg/dL   BUN 12 6 - 20 mg/dL   Creatinine, Ser 9.26 0.44 - 1.00 mg/dL   Calcium  9.3 8.9 - 10.3 mg/dL   Total Protein 6.8 6.5 - 8.1 g/dL   Albumin 2.8 (L) 3.5 - 5.0 g/dL   AST 23 15 - 41 U/L   ALT 18 0 -  44 U/L   Alkaline Phosphatase 87 38 - 126 U/L   Total Bilirubin 0.4 0.0 - 1.2 mg/dL   GFR, Estimated >39 >39 mL/min   Anion gap 15 5 - 15  Protein / creatinine ratio, urine     Status: Abnormal   Collection Time: 03/24/24  5:15 PM  Result Value Ref Range   Creatinine, Urine 168 mg/dL   Total Protein, Urine 47 mg/dL   Protein Creatinine Ratio 0.28 (H) 0.00 - 0.15 mg/mg[Cre]  CBC     Status: Abnormal   Collection Time: 03/24/24  5:15 PM  Result Value Ref Range   WBC 12.4 (H) 4.0 - 10.5 K/uL   RBC 4.63 3.87 - 5.11 MIL/uL   Hemoglobin 12.8 12.0 - 15.0 g/dL   HCT 61.3 63.9 - 53.9 %   MCV 83.4 80.0 - 100.0 fL   MCH 27.6 26.0 - 34.0 pg   MCHC 33.2 30.0 - 36.0 g/dL   RDW 84.1 (H) 88.4 - 84.4 %   Platelets 240 150 - 400 K/uL   nRBC 0.0 0.0 - 0.2 %  Type and screen [facility]     Status: None   Collection Time: 03/24/24  5:15 PM  Result Value Ref Range   ABO/RH(D) A POS    Antibody Screen NEG    Sample Expiration      03/27/2024,2359 Performed at [facility], [address] [place], [place] [address]      Assessment/Plan: 1) admit 2) labetalol  per protocol 3) epidural on request 4) misoprostol 25 mcg per vagina every 4 hours.  Foley balloon versus AROM when able   Marjorie Gull 03/24/2024, 6:27 PM

## 2024-03-24 NOTE — MAU Provider Note (Signed)
 None     S Ms. [name] is a 38 y.o. G1P0 patient who presents to MAU today with complaint of elevated BP.  She has been from her OB office for direct admission to labor and delivery for elevated blood pressure.  O BP 138/76   Pulse (!) 109   Temp 98.3 F (36.8 C)   Resp 18   Ht 5' 6 (1.676 m)   Wt 119.3 kg   LMP 07/11/2023 (Exact Date)   BMI 42.45 kg/m  Physical Exam Vitals reviewed.  Constitutional:      General: She is not in acute distress.    Appearance: She is well-developed. She is not diaphoretic.  Eyes:     General: No scleral icterus. Pulmonary:     Effort: Pulmonary effort is normal. No respiratory distress.  Skin:    General: Skin is warm and dry.  Neurological:     Mental Status: She is alert.     Coordination: Coordination normal.     A Medical screening exam complete 1. Chronic hypertension affecting pregnancy (Primary)  2. [redacted] weeks gestation of pregnancy   P Patient to be admitted to L&D by her OB.  Wallace Joesph LABOR, [place] 03/24/2024 4:51 PM

## 2024-03-25 ENCOUNTER — Inpatient Hospital Stay (HOSPITAL_COMMUNITY): Admitting: Anesthesiology

## 2024-03-25 LAB — COMPREHENSIVE METABOLIC PANEL WITH GFR
ALT: 16 U/L (ref 0–44)
ALT: 17 U/L (ref 0–44)
ALT: 18 U/L (ref 0–44)
AST: 27 U/L (ref 15–41)
AST: 21 U/L (ref 15–41)
AST: 23 U/L (ref 15–41)
Albumin: 2.7 g/dL — ABNORMAL LOW (ref 3.5–5.0)
Albumin: 2.7 g/dL — ABNORMAL LOW (ref 3.5–5.0)
Albumin: 2.8 g/dL — ABNORMAL LOW (ref 3.5–5.0)
Alkaline Phosphatase: 90 U/L (ref 38–126)
Alkaline Phosphatase: 86 U/L (ref 38–126)
Alkaline Phosphatase: 93 U/L (ref 38–126)
Anion gap: 14 (ref 5–15)
Anion gap: 15 (ref 5–15)
Anion gap: 11 (ref 5–15)
BUN: 11 mg/dL (ref 6–20)
BUN: 7 mg/dL (ref 6–20)
BUN: 5 mg/dL — ABNORMAL LOW (ref 6–20)
CO2: 19 mmol/L — ABNORMAL LOW (ref 22–32)
CO2: 19 mmol/L — ABNORMAL LOW (ref 22–32)
CO2: 21 mmol/L — ABNORMAL LOW (ref 22–32)
Calcium: 8.9 mg/dL (ref 8.9–10.3)
Calcium: 8.3 mg/dL — ABNORMAL LOW (ref 8.9–10.3)
Calcium: 8.2 mg/dL — ABNORMAL LOW (ref 8.9–10.3)
Chloride: 103 mmol/L (ref 98–111)
Chloride: 100 mmol/L (ref 98–111)
Chloride: 100 mmol/L (ref 98–111)
Creatinine, Ser: 0.57 mg/dL (ref 0.44–1.00)
Creatinine, Ser: 0.55 mg/dL (ref 0.44–1.00)
Creatinine, Ser: 0.57 mg/dL (ref 0.44–1.00)
GFR, Estimated: >60 mL/min (ref >60)
GFR, Estimated: >60 mL/min (ref >60)
GFR, Estimated: >60 mL/min (ref >60)
Glucose, Bld: 87 mg/dL (ref 70–99)
Glucose, Bld: 92 mg/dL (ref 70–99)
Glucose, Bld: 97 mg/dL (ref 70–99)
Potassium: 4 mmol/L (ref 3.5–5.1)
Potassium: 3.9 mmol/L (ref 3.5–5.1)
Potassium: 3.9 mmol/L (ref 3.5–5.1)
Sodium: 136 mmol/L (ref 135–145)
Sodium: 134 mmol/L — ABNORMAL LOW (ref 135–145)
Sodium: 132 mmol/L — ABNORMAL LOW (ref 135–145)
Total Bilirubin: 0.5 mg/dL (ref 0.0–1.2)
Total Bilirubin: 0.6 mg/dL (ref 0.0–1.2)
Total Bilirubin: 0.5 mg/dL (ref 0.0–1.2)
Total Protein: 6.6 g/dL (ref 6.5–8.1)
Total Protein: 6.6 g/dL (ref 6.5–8.1)
Total Protein: 7 g/dL (ref 6.5–8.1)

## 2024-03-25 LAB — CBC
HCT: 37.2 % (ref 36.0–46.0)
HCT: 38.7 % (ref 36.0–46.0)
HCT: 39.7 % (ref 36.0–46.0)
Hemoglobin: 12.7 g/dL (ref 12.0–15.0)
Hemoglobin: 12.9 g/dL (ref 12.0–15.0)
Hemoglobin: 13.1 g/dL (ref 12.0–15.0)
MCH: 27.8 pg (ref 26.0–34.0)
MCH: 27.7 pg (ref 26.0–34.0)
MCH: 27.3 pg (ref 26.0–34.0)
MCHC: 34.1 g/dL (ref 30.0–36.0)
MCHC: 33.3 g/dL (ref 30.0–36.0)
MCHC: 33 g/dL (ref 30.0–36.0)
MCV: 81.4 fL (ref 80.0–100.0)
MCV: 83.2 fL (ref 80.0–100.0)
MCV: 82.9 fL (ref 80.0–100.0)
Platelets: 221 K/uL (ref 150–400)
Platelets: 220 K/uL (ref 150–400)
Platelets: 237 K/uL (ref 150–400)
RBC: 4.57 MIL/uL (ref 3.87–5.11)
RBC: 4.65 MIL/uL (ref 3.87–5.11)
RBC: 4.79 MIL/uL (ref 3.87–5.11)
RDW: 15.9 % — ABNORMAL HIGH (ref 11.5–15.5)
RDW: 15.9 % — ABNORMAL HIGH (ref 11.5–15.5)
RDW: 15.9 % — ABNORMAL HIGH (ref 11.5–15.5)
WBC: 11.8 K/uL — ABNORMAL HIGH (ref 4.0–10.5)
WBC: 12.5 K/uL — ABNORMAL HIGH (ref 4.0–10.5)
WBC: 13.4 K/uL — ABNORMAL HIGH (ref 4.0–10.5)
nRBC: 0 % (ref 0.0–0.2)
nRBC: 0 % (ref 0.0–0.2)
nRBC: 0 % (ref 0.0–0.2)

## 2024-03-25 LAB — RPR: RPR Ser Ql: NONREACTIVE

## 2024-03-25 MED ORDER — LACTATED RINGERS IV SOLN: 500.0000 mL | INTRAVENOUS | Status: DC | PRN

## 2024-03-25 MED ORDER — MAGNESIUM SULFATE BOLUS VIA INFUSION
4.0000 g | Freq: Once | INTRAVENOUS | Status: AC
  Administered 2024-03-25: 4 g via INTRAVENOUS
  Filled 2024-03-25: qty 1000

## 2024-03-25 MED ORDER — TERBUTALINE SULFATE 1 MG/ML IJ SOLN: 0.2500 mg | Freq: Once | INTRAMUSCULAR | Status: DC | PRN

## 2024-03-25 MED ORDER — LACTATED RINGERS IV SOLN
500.0000 mL | Freq: Once | INTRAVENOUS | Status: AC
  Administered 2024-03-25: 500 mL via INTRAVENOUS

## 2024-03-25 MED ORDER — MAGNESIUM SULFATE 40 GM/1000ML IV SOLN
2.0000 g/h | INTRAVENOUS | Status: DC
  Administered 2024-03-25 – 2024-03-26 (×2): 2 g/h via INTRAVENOUS
  Filled 2024-03-25 (×2): qty 1000

## 2024-03-25 MED ORDER — DIPHENHYDRAMINE HCL 50 MG/ML IJ SOLN: 12.5000 mg | INTRAMUSCULAR | Status: DC | PRN

## 2024-03-25 MED ORDER — EPHEDRINE 5 MG/ML INJ
10.0000 mg | INTRAVENOUS | Status: DC | PRN
  Filled 2024-03-25: qty 5

## 2024-03-25 MED ORDER — PHENYLEPHRINE 80 MCG/ML (10ML) SYRINGE FOR IV PUSH (FOR BLOOD PRESSURE SUPPORT)
80.0000 ug | PREFILLED_SYRINGE | INTRAVENOUS | Status: DC | PRN
  Filled 2024-03-25: qty 10

## 2024-03-25 MED ORDER — LABETALOL HCL 200 MG PO TABS
400.0000 mg | ORAL_TABLET | Freq: Three times a day (TID) | ORAL | Status: DC
  Administered 2024-03-25 – 2024-03-27 (×7): 400 mg via ORAL
  Filled 2024-03-25 (×8): qty 2

## 2024-03-25 MED ORDER — LACTATED RINGERS IV SOLN: INTRAVENOUS | Status: DC

## 2024-03-25 MED ORDER — EPHEDRINE 5 MG/ML INJ: 10.0000 mg | INTRAVENOUS | Status: DC | PRN

## 2024-03-25 MED ORDER — OXYTOCIN-SODIUM CHLORIDE 30-0.9 UT/500ML-% IV SOLN
1.0000 m[IU]/min | INTRAVENOUS | Status: DC
  Administered 2024-03-25: 2 m[IU]/min via INTRAVENOUS
  Filled 2024-03-25: qty 500

## 2024-03-25 MED ORDER — LIDOCAINE HCL (PF) 1 % IJ SOLN
INTRAMUSCULAR | Status: DC | PRN
  Administered 2024-03-25: 8 mL via EPIDURAL

## 2024-03-25 MED ORDER — LACTATED RINGERS IV SOLN
INTRAVENOUS | Status: AC
Start: 2024-03-25 — End: 2024-03-26

## 2024-03-25 MED ORDER — FENTANYL-BUPIVACAINE-NACL 0.5-0.125-0.9 MG/250ML-% EP SOLN
12.0000 mL/h | EPIDURAL | Status: DC | PRN
  Administered 2024-03-25: 12 mL/h via EPIDURAL
  Filled 2024-03-25: qty 250

## 2024-03-25 MED ORDER — PHENYLEPHRINE 80 MCG/ML (10ML) SYRINGE FOR IV PUSH (FOR BLOOD PRESSURE SUPPORT): 80.0000 ug | PREFILLED_SYRINGE | INTRAVENOUS | Status: DC | PRN

## 2024-03-25 NOTE — Anesthesia Procedure Notes (Signed)
 Epidural Patient location during procedure: OB Start time: 03/25/2024 10:15 PM End time: 03/25/2024 10:25 PM  Staffing Anesthesiologist: Niels Marien CROME, MD Performed: anesthesiologist   Preanesthetic Checklist Completed: patient identified, IV checked, risks and benefits discussed, monitors and equipment checked, pre-op evaluation and timeout performed  Epidural Patient position: sitting Prep: DuraPrep and site prepped and draped Patient monitoring: continuous pulse ox, blood pressure, heart rate and cardiac monitor Approach: midline Location: L3-L4 Injection technique: LOR air  Needle:  Needle type: Tuohy  Needle gauge: 17 G Needle length: 9 cm Needle insertion depth: 8 cm Catheter type: closed end flexible Catheter size: 19 Gauge Catheter at skin depth: 14 cm Test dose: negative  Assessment Sensory level: T8 Events: blood not aspirated, no cerebrospinal fluid, injection not painful, no injection resistance, no paresthesia and negative IV test  Additional Notes Patient identified. Risks/Benefits/Options discussed with patient including but not limited to bleeding, infection, nerve damage, paralysis, failed block, incomplete pain control, headache, blood pressure changes, nausea, vomiting, reactions to medication both or allergic, itching and postpartum back pain. Confirmed with bedside nurse the patient's most recent platelet count. Confirmed with patient that they are not currently taking any anticoagulation, have any bleeding history or any family history of bleeding disorders. Patient expressed understanding and wished to proceed. All questions were answered. Sterile technique was used throughout the entire procedure. Please see nursing notes for vital signs. Test dose was given through epidural catheter and negative prior to continuing to dose epidural or start infusion. Warning signs of high block given to the patient including shortness of breath, tingling/numbness in  hands, complete motor block, or any concerning symptoms with instructions to call for help. Patient was given instructions on fall risk and not to get out of bed. All questions and concerns addressed with instructions to call with any issues or inadequate analgesia.  Reason for block:procedure for pain

## 2024-03-25 NOTE — Progress Notes (Signed)
 [name] is a 38 y.o. G1P0 at [redacted]w[redacted]d admitted for induction of labor due to uncontroleld cHTN and non-reassuring ANT. Has now ruled in for cHTN w/ SIPE.  Subjective: Patient doing well. Reports mild pain with contractions. Denies VB or LOF. + FM. Denies Denies HA, VC, CP, SOB or RUQ pain.   Objective: BP (!) 141/56   Pulse 81   Temp 98 F (36.7 C) (Oral)   Resp 17   Ht 5' 6 (1.676 m)   Wt 119.3 kg   LMP 07/11/2023 (Exact Date)   SpO2 98%   BMI 42.45 kg/m  No intake/output data recorded. No intake/output data recorded.  FHT:  FHR: 150 bpm, variability: moderate,  accelerations:  Present,  decelerations:  Absent UC:   regular, every 3 minutes SVE:   Dilation: Fingertip Effacement (%): Thick Station: -2 Exam by:: Dr. Lane  Labs: Lab Results  Component Value Date   WBC 12.4 (H) 03/24/2024   HGB 12.8 03/24/2024   HCT 38.6 03/24/2024   MCV 83.4 03/24/2024   PLT 240 03/24/2024    Assessment / Plan: Induction of labor due to cHTN now w/ SIPE.  Labor:  s/p cytotec x 2 0.5/20/-4, cooks @ 07:35 Pitocin ordered, has not been started   Preeclampsia:  on magnesium sulfate, no signs or symptoms of toxicity, intake and ouput balanced, and labs stable - plt 221 Fetal Wellbeing:  Category I Pain Control:  Epidural and IV pain meds   Charmaine CHRISTELLA Lane, MD 03/25/2024, 7:12 AM

## 2024-03-25 NOTE — Anesthesia Preprocedure Evaluation (Signed)
 Anesthesia Evaluation  Patient identified by MRN, date of birth, ID band Patient awake    Reviewed: Allergy & Precautions, NPO status , Patient's Chart, lab work & pertinent test results, reviewed documented beta blocker date and time   Airway Mallampati: III  TM Distance: >3 FB Neck ROM: Full    Dental no notable dental hx.    Pulmonary neg pulmonary ROS   Pulmonary exam normal breath sounds clear to auscultation       Cardiovascular hypertension (cHTN with SIPE), Pt. on home beta blockers and Pt. on medications Normal cardiovascular exam Rhythm:Regular Rate:Normal     Neuro/Psych  Headaches PSYCHIATRIC DISORDERS Anxiety Depression       GI/Hepatic negative GI ROS, Neg liver ROS,,,  Endo/Other    Class 3 obesity (BMI 43)  Renal/GU negative Renal ROS  negative genitourinary   Musculoskeletal negative musculoskeletal ROS (+)    Abdominal   Peds  Hematology negative hematology ROS (+)   Anesthesia Other Findings IOL for cHTN with SIPE  Reproductive/Obstetrics (+) Pregnancy                              Anesthesia Physical Anesthesia Plan  ASA: 3  Anesthesia Plan: Epidural   Post-op Pain Management:    Induction:   PONV Risk Score and Plan: Treatment may vary due to age or medical condition  Airway Management Planned: Natural Airway  Additional Equipment:   Intra-op Plan:   Post-operative Plan:   Informed Consent: I have reviewed the patients History and Physical, chart, labs and discussed the procedure including the risks, benefits and alternatives for the proposed anesthesia with the patient or authorized representative who has indicated his/her understanding and acceptance.       Plan Discussed with: Anesthesiologist  Anesthesia Plan Comments: (Patient identified. Risks, benefits, options discussed with patient including but not limited to bleeding, infection, nerve  damage, paralysis, failed block, incomplete pain control, headache, blood pressure changes, nausea, vomiting, reactions to medication, itching, and post partum back pain. Confirmed with bedside nurse the patient's most recent platelet count. Confirmed with the patient that they are not taking any anticoagulation, have any bleeding history or any family history of bleeding disorders. Patient expressed understanding and wishes to proceed. All questions were answered. )        Anesthesia Quick Evaluation

## 2024-03-26 ENCOUNTER — Inpatient Hospital Stay (HOSPITAL_COMMUNITY): Admission: RE | Admit: 2024-03-26 | Source: Home / Self Care | Admitting: Obstetrics and Gynecology

## 2024-03-26 ENCOUNTER — Encounter (HOSPITAL_COMMUNITY): Payer: Self-pay | Admitting: Obstetrics and Gynecology

## 2024-03-26 ENCOUNTER — Inpatient Hospital Stay (HOSPITAL_COMMUNITY)

## 2024-03-26 LAB — CBC
HCT: 37.6 % (ref 36.0–46.0)
HCT: 37.6 % (ref 36.0–46.0)
Hemoglobin: 12.5 g/dL (ref 12.0–15.0)
Hemoglobin: 12.6 g/dL (ref 12.0–15.0)
MCH: 27.4 pg (ref 26.0–34.0)
MCH: 27.6 pg (ref 26.0–34.0)
MCHC: 33.2 g/dL (ref 30.0–36.0)
MCHC: 33.5 g/dL (ref 30.0–36.0)
MCV: 82.3 fL (ref 80.0–100.0)
MCV: 82.5 fL (ref 80.0–100.0)
Platelets: 219 K/uL (ref 150–400)
Platelets: 212 K/uL (ref 150–400)
RBC: 4.57 MIL/uL (ref 3.87–5.11)
RBC: 4.56 MIL/uL (ref 3.87–5.11)
RDW: 15.8 % — ABNORMAL HIGH (ref 11.5–15.5)
RDW: 15.7 % — ABNORMAL HIGH (ref 11.5–15.5)
WBC: 15.4 K/uL — ABNORMAL HIGH (ref 4.0–10.5)
WBC: 16.9 K/uL — ABNORMAL HIGH (ref 4.0–10.5)
nRBC: 0 % (ref 0.0–0.2)
nRBC: 0 % (ref 0.0–0.2)

## 2024-03-26 MED ORDER — PRENATAL MULTIVITAMIN CH
1.0000 | ORAL_TABLET | Freq: Every day | ORAL | Status: DC
  Administered 2024-03-26 – 2024-03-27 (×2): 1 via ORAL
  Filled 2024-03-26 (×2): qty 1

## 2024-03-26 MED ORDER — LABETALOL HCL 5 MG/ML IV SOLN: 40.0000 mg | INTRAVENOUS | Status: DC | PRN

## 2024-03-26 MED ORDER — WITCH HAZEL-GLYCERIN EX PADS: 1.0000 | MEDICATED_PAD | CUTANEOUS | Status: DC | PRN

## 2024-03-26 MED ORDER — LACTATED RINGERS IV SOLN
INTRAVENOUS | Status: AC
Start: 2024-03-27 — End: 2024-03-28

## 2024-03-26 MED ORDER — ONDANSETRON HCL 4 MG/2ML IJ SOLN: 4.0000 mg | INTRAMUSCULAR | Status: DC | PRN

## 2024-03-26 MED ORDER — SENNOSIDES-DOCUSATE SODIUM 8.6-50 MG PO TABS
2.0000 | ORAL_TABLET | Freq: Every day | ORAL | Status: DC
  Administered 2024-03-27 – 2024-03-28 (×2): 2 via ORAL
  Filled 2024-03-26 (×2): qty 2

## 2024-03-26 MED ORDER — SERTRALINE HCL 50 MG PO TABS
200.0000 mg | ORAL_TABLET | Freq: Every day | ORAL | Status: DC
  Administered 2024-03-26 – 2024-03-28 (×3): 200 mg via ORAL
  Filled 2024-03-26 (×2): qty 4
  Filled 2024-03-26: qty 2

## 2024-03-26 MED ORDER — ZOLPIDEM TARTRATE 5 MG PO TABS: 5.0000 mg | ORAL_TABLET | Freq: Every evening | ORAL | Status: DC | PRN

## 2024-03-26 MED ORDER — SIMETHICONE 80 MG PO CHEW: 80.0000 mg | CHEWABLE_TABLET | ORAL | Status: DC | PRN

## 2024-03-26 MED ORDER — DIPHENHYDRAMINE HCL 25 MG PO CAPS: 25.0000 mg | ORAL_CAPSULE | Freq: Four times a day (QID) | ORAL | Status: DC | PRN

## 2024-03-26 MED ORDER — LABETALOL HCL 5 MG/ML IV SOLN: 20.0000 mg | INTRAVENOUS | Status: DC | PRN

## 2024-03-26 MED ORDER — TETANUS-DIPHTH-ACELL PERTUSSIS 5-2-15.5 LF-MCG/0.5 IM SUSP
0.5000 mL | Freq: Once | INTRAMUSCULAR | Status: DC
Start: 2024-03-27 — End: 2024-03-28

## 2024-03-26 MED ORDER — IBUPROFEN 600 MG PO TABS
600.0000 mg | ORAL_TABLET | Freq: Four times a day (QID) | ORAL | Status: DC
  Administered 2024-03-26 – 2024-03-28 (×7): 600 mg via ORAL
  Filled 2024-03-26 (×8): qty 1

## 2024-03-26 MED ORDER — COCONUT OIL OIL
1.0000 | TOPICAL_OIL | Status: DC | PRN
  Administered 2024-03-26: 1 via TOPICAL

## 2024-03-26 MED ORDER — DIBUCAINE (PERIANAL) 1 % EX OINT: 1.0000 | TOPICAL_OINTMENT | CUTANEOUS | Status: DC | PRN

## 2024-03-26 MED ORDER — HYDRALAZINE HCL 20 MG/ML IJ SOLN: 5.0000 mg | INTRAMUSCULAR | Status: DC | PRN

## 2024-03-26 MED ORDER — MAGNESIUM SULFATE 40 GM/1000ML IV SOLN
2.0000 g/h | INTRAVENOUS | Status: AC
  Administered 2024-03-26: 2 g/h via INTRAVENOUS
  Filled 2024-03-26: qty 1000

## 2024-03-26 MED ORDER — HYDRALAZINE HCL 20 MG/ML IJ SOLN: 10.0000 mg | INTRAMUSCULAR | Status: DC | PRN

## 2024-03-26 MED ORDER — BUPROPION HCL ER (XL) 150 MG PO TB24
150.0000 mg | ORAL_TABLET | Freq: Every day | ORAL | Status: DC
  Administered 2024-03-26 – 2024-03-28 (×3): 150 mg via ORAL
  Filled 2024-03-26 (×3): qty 1

## 2024-03-26 MED ORDER — ACETAMINOPHEN 325 MG PO TABS
650.0000 mg | ORAL_TABLET | ORAL | Status: DC | PRN
  Administered 2024-03-27: 650 mg via ORAL
  Filled 2024-03-26: qty 2

## 2024-03-26 MED ORDER — BENZOCAINE-MENTHOL 20-0.5 % EX AERO
1.0000 | INHALATION_SPRAY | CUTANEOUS | Status: DC | PRN
  Administered 2024-03-26: 1 via TOPICAL
  Filled 2024-03-26: qty 56

## 2024-03-26 MED ORDER — ONDANSETRON HCL 4 MG PO TABS: 4.0000 mg | ORAL_TABLET | ORAL | Status: DC | PRN

## 2024-03-26 NOTE — Progress Notes (Signed)
 Delayed documentation due to delivery in another room, visit occurred at 9:20am SOLYMAR GRACE is a 38 y.o. G1P0 at [redacted]w[redacted]d admitted for induction of labor due to uncontroleld cHTN and non-reassuring ANT. Has now ruled in for cHTN w/ SIPE.  Subjective: Patient doing well. Pain well controlled with epidural. Reports pelvic pressure and mild headache.  Objective: BP 130/80   Pulse 99   Temp 98.6 F (37 C)   Resp 20   Ht 5' 6 (1.676 m)   Wt 119.3 kg   LMP 07/11/2023 (Exact Date)   SpO2 96%   BMI 42.45 kg/m  I/O last 3 completed shifts: In: 5302.7 [P.O.:1250; I.V.:3945.7; Other:107] Out: 5725 [Urine:5725] Total I/O In: 154.6 [I.V.:142.4; Other:12.2] Out: 30 [Urine:30]  FHT:  FHR: 150 bpm, variability: moderate,  accelerations:  present,  decelerations:  Absent UC:   regular, q2-1min, intermittently adequate SVE:   Dilation: 5 Effacement (%): 70 Station: -1 Exam by:: Jerome Viglione  Labs: Lab Results  Component Value Date   WBC 15.4 (H) 03/26/2024   HGB 12.5 03/26/2024   HCT 37.6 03/26/2024   MCV 82.3 03/26/2024   PLT 219 03/26/2024    Assessment / Plan: Sandrine Bloodsworth is a 38 year old G1P0 at [redacted]w[redacted]d undergoing induction of labor due to cHTN now w/ SIPE.  -IOL: s/p cytotec x 2 & cooks. AROM @ 00:10, blood tinged fluid. IUPC in place, intermittently adequate contractions. Continue augmentation w/ pitocin. Recheck in 4hr. Epidural in place, mildly uncomfortable, was given bolus.  -superimposed pre-eclampsia: on magnesium infusion. Home labetalol  increased to 400mg  TID. Bps mild range. Labs stable. Mildly symptomatic with headache, to be treated. No signs of magnesium toxicity. UOP adequate but borderline. Will monitor.  -anxiety/depression: home Wellbutrin  and Zoloft  ordered.   Dispo: Anticipate SVD.   Rubie DELENA Husky, MD 03/26/2024, 10:52 AM

## 2024-03-26 NOTE — Lactation Note (Signed)
 This note was copied from a baby's chart. Lactation Consultation Note  Patient Name: [name] Date: 03/26/2024 Age:38 hours Reason for consult: Initial assessment;Primapara;Early term 37-38.6wks  P1. Mom stated the baby BF well to Lt. Breast for 20 min. 1700. Mom gave formula round 1900. Attempted to get baby to latch and he wasn't having it. Fussing. LC checked diaper and changed void and stool. Placed baby on Lt. Breast and he suckled about 30 sec. Then stopped. Mom placed baby on her chest to calm him. Then she gave formula. LC gave purple nipple. He tolerated well. Newborn feeding habits, behavior, STS, I&O, positioning, support, body alignment reviewed. Worked w/mom on latching but baby was fussy. Mom encouraged to feed baby 8-12 times/24 hours and with feeding cues.  LPI information sheet given and Plan of care sheet w/supplemental amounts given and reviewed d/t behavior. Answered questions. Encouraged to call for assistance as needed.   Maternal Data Has patient been taught Hand Expression?: Yes Does the patient have breastfeeding experience prior to this delivery?: No  Feeding Nipple Type: Nfant Slow Flow (purple)  LATCH Score Latch: Too sleepy or reluctant, no latch achieved, no sucking elicited.  Audible Swallowing: None  Type of Nipple: Everted at rest and after stimulation  Comfort (Breast/Nipple): Soft / non-tender  Hold (Positioning): Assistance needed to correctly position infant at breast and maintain latch.  LATCH Score: 5   Lactation Tools Discussed/Used Tools: Pump;Flanges Flange Size: 24 Breast pump type: Double-Electric Breast Pump (RN set up/asked mom if she had questions she stated no) Reason for Pumping: less than 6 lbs Pumping frequency: q 3hr  Interventions Interventions: Breast feeding basics reviewed;Assisted with latch;Skin to skin;Breast massage;Hand express;Breast compression;Adjust position;Support pillows;Position  options;DEBP;Education;LC Services brochure;Infant Driven Feeding Algorithm education;LPT handout/interventions  Discharge Discharge Education: Outpatient recommendation Pump: Hands Free (mom cozy)  Consult Status Consult Status: Follow-up Date: 03/27/24 Follow-up type: In-patient    [name] G 03/26/2024, 9:53 PM

## 2024-03-26 NOTE — Progress Notes (Signed)
 [name] is a 38 y.o. G1P0 at [redacted]w[redacted]d admitted for induction of labor due to uncontroleld cHTN and non-reassuring ANT. Has now ruled in for cHTN w/ SIPE.  Subjective: Patient doing well. Pain well controlled with epidural. Denies VB, or LF. + ctx. Good fetal movement.  Denies Denies HA, VC, CP, SOB or RUQ pain.   Objective: BP 120/61   Pulse 75   Temp 98.1 F (36.7 C) (Axillary)   Resp 20   Ht 5' 6 (1.676 m)   Wt 119.3 kg   LMP 07/11/2023 (Exact Date)   SpO2 96%   BMI 42.45 kg/m  I/O last 3 completed shifts: In: 1891.9 [I.V.:1891.9] Out: 2350 [Urine:2350] Total I/O In: 2059 [P.O.:950; I.V.:1109] Out: 825 [Urine:825]  FHT:  FHR: 130 bpm, variability: moderate,  accelerations:  absent,  decelerations:  Absent UC:   regular, but difficult to trace while lying flat SVE:   Dilation: 5 Effacement (%): 70 Station: -2 Exam by:: Pamela Hyland MD  Labs: Lab Results  Component Value Date   WBC 13.4 (H) 03/25/2024   HGB 13.1 03/25/2024   HCT 39.7 03/25/2024   MCV 82.9 03/25/2024   PLT 237 03/25/2024    Assessment / Plan: Induction of labor due to cHTN now w/ SIPE. Continue augmentation w/ pitocin. Recheck in 4hr  Labor:  s/p cytotec x 2 & cooks Pitocin at 10 milli-unit, increased by 2 q30 min PRN AROM @ 00:10, blood tinged fluid    Preeclampsia:  on magnesium sulfate, no signs or symptoms of toxicity, intake and ouput balanced, and labs stable - plt 237 Fetal Wellbeing:  Category I Pain Control:  Epidural in place    Pamela CHRISTELLA Oz, MD 03/26/2024, 12:15 AM

## 2024-03-27 LAB — CBC
HCT: 32.9 % — ABNORMAL LOW (ref 36.0–46.0)
Hemoglobin: 11 g/dL — ABNORMAL LOW (ref 12.0–15.0)
MCH: 28 pg (ref 26.0–34.0)
MCHC: 33.4 g/dL (ref 30.0–36.0)
MCV: 83.7 fL (ref 80.0–100.0)
Platelets: 217 K/uL (ref 150–400)
RBC: 3.93 MIL/uL (ref 3.87–5.11)
RDW: 15.7 % — ABNORMAL HIGH (ref 11.5–15.5)
WBC: 15.4 K/uL — ABNORMAL HIGH (ref 4.0–10.5)
nRBC: 0 % (ref 0.0–0.2)

## 2024-03-27 MED ORDER — LABETALOL HCL 200 MG PO TABS
300.0000 mg | ORAL_TABLET | Freq: Three times a day (TID) | ORAL | Status: DC
  Administered 2024-03-27 – 2024-03-28 (×2): 300 mg via ORAL
  Filled 2024-03-27 (×2): qty 1

## 2024-03-27 NOTE — Progress Notes (Signed)
 Post Partum Day 1 Subjective: up ad lib, voiding, tolerating PO, + flatus, and lochia mild. She is tolerating being on magnesium sulfate well - denies HA, SOB or CP. Fatigue tolerable. Pain well controlled. She is bonding well with baby [name]. Confirms they do not desire circumcision for him  Objective: Blood pressure 118/62, pulse 83, temperature 97.8 F (36.6 C), temperature source Oral, resp. rate 16, height 5' 6 (1.676 m), weight 119.3 kg, last menstrual period 07/11/2023, SpO2 97%, unknown if currently breastfeeding.  Physical Exam:  General: alert, cooperative, and no distress Lochia: appropriate Uterine Fundus: firm Incision: n/a DVT Evaluation: No evidence of DVT seen on physical exam.  Recent Labs    03/26/24 1407 03/27/24 0408  HGB 12.6 11.0*  HCT 37.6 32.9*    Assessment/Plan:  PPD#1 - s/p svd CHTN with superI preE - On magnesium sulfate - scheduled stop at 1444pm today.       Continue on labetalol  400mg  po tid- consider adjusting time for better ease for pt. Titrate dose as needed off magnesium sulfate       Asymptomatic   Anxiety - stable on wellbutrin  and zoloft  Routine pp care  Plan for discharge tomorrow   LOS: 3 days   Dmarcus Decicco W Kennethia Lynes, DO 03/27/2024, 11:47 AM

## 2024-03-27 NOTE — Anesthesia Postprocedure Evaluation (Signed)
 Anesthesia Post Note  Patient: [name]  Procedure(s) Performed: AN AD HOC LABOR EPIDURAL     Patient location during evaluation: Mother Baby Anesthesia Type: Epidural Level of consciousness: awake and alert Pain management: pain level controlled Vital Signs Assessment: post-procedure vital signs reviewed and stable Respiratory status: spontaneous breathing, nonlabored ventilation and respiratory function stable Cardiovascular status: stable Postop Assessment: no headache, no backache and epidural receding Anesthetic complications: no   No notable events documented.  Last Vitals:  Vitals:   03/27/24 0800 03/27/24 0905  BP:    Pulse:    Resp: 18 16  Temp:    SpO2:      Last Pain:  Vitals:   03/27/24 0800  TempSrc:   PainSc: 2    Pain Goal: Patients Stated Pain Goal: 3 (03/26/24 1405)                 [name]

## 2024-03-27 NOTE — Lactation Note (Signed)
 This note was copied from a baby's chart. Lactation Consultation Note  Patient Name: [name] Unijb'd Date: 03/27/2024 Age:38 hours Reason for consult: Follow-up assessment;Breastfeeding assistance;Early term 37-38.6wks;Infant < 6lbs;Difficult latch;Other (Comment) (Maternal HTN on Procardia , Maternal anxiety on Zoloft  and Wellbutrin )  LC in to visit with [name] of baby [name] born at 19wks. Baby is currently at a 3% weight loss and output it adequate.  Mom currently trying to latch baby in cradle hold.  LC offered to assist with positioning baby.  LC reclined Mom in bed and undressed baby to provide for STS explaining the benefits.  Baby trying to open his mouth wide enough for Mom's breast, he did but when he started sucking, his mouth dropped to base of nipple.  Mom describes this when she has been feeding him.  Baby has a little mouth, and Mom has an ample size nipple (length and diameter).  LC reassured her that baby would grow and be able to handle latching deeply to her breast, but currently, baby is struggling to latch onto areola.  Talked about OP lactation support and how importance this would be.  LC noticed baby was jittery.  RN came in and DC'd magnesium sulfate.  Lab came in to draw PKU.  Talked about normal ET newborn feeding behavior and the need to supplement due to being <6 lbs.  Last supplement was 4 am per Mom.  Baby has been breastfeeding every 2-3 hrs without supplementation.   Mom hasn't initiated pumping.  LC provided a pumping top and assisted her to pump using the 21 mm flanges for a better fit.  Mom instructed on using the initiation setting.  Plan recommended- 1- Keep baby STS as much as possible 2- at least every 3 hrs, offer the breast with feeding cues 3- supplement with EBM+/formula by paced bottle (RN teaching this to FOB) volume per guidelines 4- Pump both breasts on initiation setting for 15 mins.  Feeding Mother's Current Feeding Choice: Breast  Milk and Formula  LATCH Score Latch: Repeated attempts needed to sustain latch, nipple held in mouth throughout feeding, stimulation needed to elicit sucking reflex.  Audible Swallowing: None  Type of Nipple: Everted at rest and after stimulation  Comfort (Breast/Nipple): Soft / non-tender  Hold (Positioning): Assistance needed to correctly position infant at breast and maintain latch.  LATCH Score: 6   Lactation Tools Discussed/Used Tools: Pump;Flanges;Hands-free pumping top Flange Size: 21 Breast pump type: Double-Electric Breast Pump Pump Education: Setup, frequency, and cleaning;Milk Storage Reason for Pumping: support full milk supply/ ET infant/ <6 lbs/ difficult latch Pumping frequency: Mom initiated double pumping at 24 hrs post delivery  Interventions Interventions: Breast feeding basics reviewed;Assisted with latch;Skin to skin;Breast massage;Hand express;Adjust position;Support pillows;Position options;DEBP;Education;Pace feeding  Discharge Pump: Hands Free;Personal (MomCozy)  Consult Status Consult Status: Follow-up Date: 03/28/24 Follow-up type: In-patient    [name] 03/27/2024, 12:52 PM

## 2024-03-28 MED ORDER — MEASLES, MUMPS & RUBELLA VAC ~~LOC~~ SUSR
0.5000 mL | Freq: Once | SUBCUTANEOUS | Status: AC
  Administered 2024-03-28: 0.5 mL via SUBCUTANEOUS
  Filled 2024-03-28: qty 0.5

## 2024-03-28 MED ORDER — IBUPROFEN 600 MG PO TABS
600.0000 mg | ORAL_TABLET | Freq: Four times a day (QID) | ORAL | 1 refills | Status: AC | PRN
  Filled 2024-03-28: qty 40, 10d supply, fill #0

## 2024-03-28 MED ORDER — LABETALOL HCL 300 MG PO TABS
300.0000 mg | ORAL_TABLET | Freq: Three times a day (TID) | ORAL | 1 refills | Status: AC
  Filled 2024-03-28: qty 90, 30d supply, fill #0

## 2024-03-28 NOTE — Progress Notes (Signed)
 Patient ID: [name], female   DOB: April 30, 1986, 38 y.o.   MRN: [id number] Pt was due for her 400mg  dose of labetalol  at 9pm however her BP had run low following cessation of magnesium sulfate. HR stable  Delayed medication x 1 year and BP increased but now in normal range  Rec decrease dose of labetalol  back to 300mg  ( her original dose) and administered   Plan to recheck BP at 5am and plan timing and dosage of medication.

## 2024-03-28 NOTE — Discharge Summary (Signed)
 Postpartum Discharge Summary  Date of Service updated      Patient Name: [name] DOB: Jun 14, 1985 MRN: [id number]  Date of admission: 03/24/2024 Delivery date:03/26/2024 Delivering provider: CLAIRE RAMAN A Date of discharge: 03/28/2024  Admitting diagnosis: chtn with superimposed preecclampsia  Intrauterine pregnancy: [redacted]w[redacted]d     Secondary diagnosis:  chtn with superimposed preecclampsia  Additional problems: n/a    Discharge diagnosis: Term Pregnancy Delivered and CHTN                                              Post partum procedures:n/a Augmentation: AROM, Pitocin, Cytotec, and IP Foley Complications: None  Hospital course: Induction of Labor With Vaginal Delivery   38 y.o. yo G1P1001 at [redacted]w[redacted]d was admitted to the hospital 03/24/2024 for induction of labor.  Indication for induction: Preeclampsia.  Patient had an labor course complicated by n/a Membrane Rupture Time/Date: 12:10 AM,03/26/2024  Delivery Method:Vaginal, Spontaneous Operative Delivery:N/A Episiotomy: None Lacerations:  2nd degree;Periurethral Details of delivery can be found in separate delivery note.  Patient had a postpartum course complicated by chtn SI preE . Patient is discharged home 03/28/24.  Newborn Data: Birth date:03/26/2024 Birth time:12:06 PM Gender:Female Living status:Living Apgars:6 ,8  Weight:2620 g  Magnesium Sulfate received: Yes: Seizure prophylaxis BMZ received: Yes Rhophylac:N/A MMR:N/A T-DaP:Given prenatally Flu: Yes RSV Vaccine received: Yes Transfusion:No Immunizations administered: Immunization History  Administered Date(s) Administered    sv, Bivalent, Protein Subunit Rsvpref,pf (Abrysvo ) 02/21/2024   MMR 03/28/2024   Moderna Sars-Covid-2 Vaccination 08/02/2019, 08/30/2019, 04/07/2020   Pfizer(Comirnaty )Fall Seasonal Vaccine 12 years and older 02/21/2024    Physical exam  Vitals:   03/27/24 2223 03/28/24 0232 03/28/24 0630 03/28/24 0813  BP: (!) 130/58  (!) 127/59 128/63 (!) 134/54  Pulse: 87 79 78 85  Resp:  16 17 18   Temp:  97.6 F (36.4 C) 98 F (36.7 C) 97.8 F (36.6 C)  TempSrc:  Oral Oral Oral  SpO2:  98% 98% 95%  Weight:      Height:       General: alert, cooperative, and no distress Lochia: appropriate Uterine Fundus: firm Incision: N/A DVT Evaluation: No evidence of DVT seen on physical exam. Labs: Lab Results  Component Value Date   WBC 15.4 (H) 03/27/2024   HGB 11.0 (L) 03/27/2024   HCT 32.9 (L) 03/27/2024   MCV 83.7 03/27/2024   PLT 217 03/27/2024      Latest Ref Rng & Units 03/25/2024    7:28 PM  CMP  Glucose 70 - 99 mg/dL 97   BUN 6 - 20 mg/dL 5   Creatinine 9.55 - 8.99 mg/dL 9.42   Sodium 864 - 854 mmol/L 132   Potassium 3.5 - 5.1 mmol/L 3.9   Chloride 98 - 111 mmol/L 100   CO2 22 - 32 mmol/L 21   Calcium  8.9 - 10.3 mg/dL 8.2   Total Protein 6.5 - 8.1 g/dL 7.0   Total Bilirubin 0.0 - 1.2 mg/dL 0.5   Alkaline Phos 38 - 126 U/L 93   AST 15 - 41 U/L 23   ALT 0 - 44 U/L 18    Edinburgh Score:    03/28/2024   10:08 AM  Edinburgh Postnatal Depression Scale Screening Tool  I have been able to laugh and see the funny side of things. 0  I have looked forward with enjoyment  to things. 0  I have blamed myself unnecessarily when things went wrong. 1  I have been anxious or worried for no good reason. 0  I have felt scared or panicky for no good reason. 0  Things have been getting on top of me. 0  I have been so unhappy that I have had difficulty sleeping. 1  I have felt sad or miserable. 1  I have been so unhappy that I have been crying. 1  The thought of harming myself has occurred to me. 0  Edinburgh Postnatal Depression Scale Total 4      After visit meds:  Allergies as of 03/28/2024       Reactions   Amoxicillin Other (See Comments)   Numbness and tingling at extremities.  Has patient had a PCN reaction causing immediate rash, facial/tongue/throat swelling, SOB or lightheadedness with  hypotension: Yes (rash) Has patient had a PCN reaction causing severe rash involving mucus membranes or skin necrosis: No Has patient had a PCN reaction that required hospitalization: No Has patient had a PCN reaction occurring within the last 10 years: Yes If all of the above answers are NO, then may proceed with Cephalosporin use.   Ceftin [cefuroxime] Other (See Comments)   Tingling, numbness, sweating        Medication List     TAKE these medications    buPROPion  150 MG 24 hr tablet Commonly known as: Wellbutrin  XL Take 1 tablet (150 mg total) by mouth daily.   ibuprofen  600 MG tablet Commonly known as: ADVIL  Take 1 tablet (600 mg total) by mouth every 6 (six) hours as needed for moderate pain (pain score 4-6) or cramping.   labetalol  300 MG tablet Commonly known as: NORMODYNE  Take 1 tablet (300 mg total) by mouth 3 (three) times daily.   sertraline  100 MG tablet Commonly known as: ZOLOFT  Take 2 tablets (200 mg total) by mouth daily.         Discharge home in stable condition Infant Feeding: Breast Infant Disposition:home with mother Discharge instruction: per After Visit Summary and Postpartum booklet. Activity: Advance as tolerated. Pelvic rest for 6 weeks.  Diet: low salt diet Anticipated Birth Control: Unsure Postpartum Appointment:6 weeks Additional Postpartum F/U: Postpartum Depression checkup and BP check 3-4 days  Future Appointments:No future appointments. Follow up Visit:  Follow-up Information     Ob/Gyn, [facility] Follow up.   Why: Call office with any concerns [phone] Appt in 3-4 days for BP check and in 6 weeks for postpartum visit Contact information: [address] [place] [address] [phone]                     03/28/2024 Ted LELON Solo, DO

## 2024-03-28 NOTE — Discharge Instructions (Signed)
 Call office with any concerns [phone]

## 2024-03-28 NOTE — Lactation Note (Signed)
 This note was copied from a baby's chart.  NICU Lactation Consultation Note  Patient Name: [name] Unijb'd Date: 03/28/2024 Age:38 hours  Reason for consult: Follow-up assessment; Early term 37-38.6wks; Difficult latch; 1st time breastfeeding; Primapara; Other (Comment) (HTN on Procardia )  SUBJECTIVE  LC in to visit with [name] of ET baby on day of discharge. Baby is at a 2% weight loss with good output and bilirubin level dropping. Baby is currently primarily bottle feeding, with some latching prior to supplementation.  Baby recently took 30 ml by bottle and is currently sleeping comfortably in his crib.    Mom still having discomfort and is lying on her side.  Mom states she is pumping, but not sure how consistent her pumping has been.  Mom has a wearable pump for home use.  Mom aware that the wearables are not recommended to establish milk supply,  Mom to take her pump parts home as she was notified of pump rentals in the hospital gift shop.  Mom encouraged to have OP lactation F/U.  Offered to message the [facility] OP lactation team, but Mom politely declined as she will be using the IBCLC at [employer], [employer].  Mom aware of support through [facility] and encouraged to call prn.  OBJECTIVE Infant data: Mother's Current Feeding Choice: Breast Milk and Formula  No data recorded Infant feeding assessment No data recorded  Maternal data: G1P1001 Vaginal, Spontaneous Pumping frequency: encouraged to pump at each feeding Pumped volume: 1 mL Flange Size: 21 Hands-free pumping top sizes: Large Alejos)  Pump: Hands Free, Personal (MomCozy)  ASSESSMENT Infant:   Feeding method: Bottle Nipple Type: Nfant Slow Flow (purple)  Maternal: No data recorded INTERVENTIONS/PLAN Interventions: Interventions: Skin to skin; Breast massage; Hand express; DEBP; Education Discharge Education: Engorgement and breast care; Warning signs for feeding baby; Outpatient  recommendation Tools: Pump; Flanges; Bottle; Hands-free pumping top Pump Education: Setup, frequency, and cleaning; Milk Storage  Plan: Consult Status: Complete (mother declined follow up)   [name] 03/28/2024, 11:57 AM

## 2024-03-28 NOTE — Progress Notes (Signed)
 Post Partum Day 2 Subjective: no complaints, up ad lib, voiding, tolerating PO, + flatus, and lochia mild. She reports had no dizziness with low BP overnight. She also denies palpitations, CP or SOB. She feels well for discharge to home today. Bonding well with baby.   Objective: Blood pressure (!) 134/54, pulse 85, temperature 97.8 F (36.6 C), temperature source Oral, resp. rate 18, height 5' 6 (1.676 m), weight 119.3 kg, last menstrual period 07/11/2023, SpO2 95%, unknown if currently breastfeeding.  Physical Exam:  General: alert, cooperative, and no distress Lochia: appropriate Uterine Fundus: firm Incision: n/a DVT Evaluation: No evidence of DVT seen on physical exam.  Recent Labs    03/26/24 1407 03/27/24 0408  HGB 12.6 11.0*  HCT 37.6 32.9*    Assessment/Plan: Discharge home and Breastfeeding Chtn w/ SI preE: was on magnesium sulfate in labor and for 24hrs postpartum. BP meds had been increased from 300mg  to 400mg  tid however after completed magnesium sulfate her BP was noted to be rather low thus dose was dropped back to 300mg  last night ( and this am) and BP in normal range Pt given hypo and hypertensive precautions. Plan for BP check in office this week.    LOS: 4 days   Fountain Derusha W Dallen Bunte, DO 03/28/2024, 11:29 AM

## 2024-03-29 ENCOUNTER — Other Ambulatory Visit (HOSPITAL_BASED_OUTPATIENT_CLINIC_OR_DEPARTMENT_OTHER): Payer: Self-pay

## 2024-04-01 ENCOUNTER — Other Ambulatory Visit (HOSPITAL_BASED_OUTPATIENT_CLINIC_OR_DEPARTMENT_OTHER): Payer: Self-pay

## 2024-04-01 DIAGNOSIS — O115 Pre-existing hypertension with pre-eclampsia, complicating the puerperium: Secondary | ICD-10-CM | POA: Diagnosis not present

## 2024-04-01 MED ORDER — LABETALOL HCL 100 MG PO TABS
100.0000 mg | ORAL_TABLET | Freq: Three times a day (TID) | ORAL | 0 refills | Status: DC
  Filled 2024-04-01: qty 30, 10d supply, fill #0

## 2024-04-05 ENCOUNTER — Other Ambulatory Visit (HOSPITAL_BASED_OUTPATIENT_CLINIC_OR_DEPARTMENT_OTHER): Payer: Self-pay

## 2024-04-05 DIAGNOSIS — O115 Pre-existing hypertension with pre-eclampsia, complicating the puerperium: Secondary | ICD-10-CM | POA: Diagnosis not present

## 2024-04-05 MED ORDER — LABETALOL HCL 100 MG PO TABS
100.0000 mg | ORAL_TABLET | Freq: Three times a day (TID) | ORAL | 0 refills | Status: AC
  Filled 2024-04-05 – 2024-04-09 (×2): qty 270, 90d supply, fill #0

## 2024-04-09 ENCOUNTER — Other Ambulatory Visit (HOSPITAL_BASED_OUTPATIENT_CLINIC_OR_DEPARTMENT_OTHER): Payer: Self-pay

## 2024-04-11 DIAGNOSIS — R109 Unspecified abdominal pain: Secondary | ICD-10-CM | POA: Diagnosis not present

## 2024-04-11 DIAGNOSIS — R82998 Other abnormal findings in urine: Secondary | ICD-10-CM | POA: Diagnosis not present

## 2024-04-11 DIAGNOSIS — R10A1 Flank pain, right side: Secondary | ICD-10-CM | POA: Diagnosis not present

## 2024-04-12 ENCOUNTER — Other Ambulatory Visit (HOSPITAL_BASED_OUTPATIENT_CLINIC_OR_DEPARTMENT_OTHER): Payer: Self-pay

## 2024-04-13 ENCOUNTER — Telehealth (HOSPITAL_COMMUNITY): Payer: Self-pay | Admitting: *Deleted

## 2024-04-13 NOTE — Telephone Encounter (Signed)
 04/13/2024  Name: [name] MRN: [id number] DOB: 1985-12-20  Reason for Call:  Transition of Care Hospital Discharge Call  Contact Status: Patient Contact Status: Message  Language assistant needed:          Follow-Up Questions:    Van Postnatal Depression Scale:  In the Past 7 Days:    PHQ2-9 Depression Scale:     Discharge Follow-up:    Post-discharge interventions: NA  Mliss Sieve, RN 04/13/2024 10:14

## 2024-04-19 ENCOUNTER — Other Ambulatory Visit (HOSPITAL_BASED_OUTPATIENT_CLINIC_OR_DEPARTMENT_OTHER): Payer: Self-pay

## 2024-04-19 ENCOUNTER — Other Ambulatory Visit: Payer: Self-pay

## 2024-04-19 DIAGNOSIS — F909 Attention-deficit hyperactivity disorder, unspecified type: Secondary | ICD-10-CM | POA: Diagnosis not present

## 2024-04-19 DIAGNOSIS — F411 Generalized anxiety disorder: Secondary | ICD-10-CM | POA: Diagnosis not present

## 2024-04-19 DIAGNOSIS — F3342 Major depressive disorder, recurrent, in full remission: Secondary | ICD-10-CM | POA: Diagnosis not present

## 2024-04-19 MED ORDER — BUPROPION HCL ER (XL) 150 MG PO TB24
150.0000 mg | ORAL_TABLET | Freq: Every day | ORAL | 1 refills | Status: AC
  Filled 2024-04-19: qty 90, 90d supply, fill #0

## 2024-04-19 MED ORDER — SERTRALINE HCL 100 MG PO TABS
100.0000 mg | ORAL_TABLET | Freq: Two times a day (BID) | ORAL | 1 refills | Status: AC
  Filled 2024-04-19: qty 180, 90d supply, fill #0

## 2024-04-19 MED ORDER — LISDEXAMFETAMINE DIMESYLATE 30 MG PO CAPS
30.0000 mg | ORAL_CAPSULE | Freq: Every morning | ORAL | 0 refills | Status: DC
  Filled 2024-04-19: qty 30, 30d supply, fill #0

## 2024-05-04 ENCOUNTER — Other Ambulatory Visit (HOSPITAL_BASED_OUTPATIENT_CLINIC_OR_DEPARTMENT_OTHER): Payer: Self-pay

## 2024-05-04 DIAGNOSIS — Z1331 Encounter for screening for depression: Secondary | ICD-10-CM | POA: Diagnosis not present

## 2024-05-04 MED ORDER — HYDROCORTISONE ACETATE 25 MG RE SUPP
RECTAL | 0 refills | Status: AC
  Filled 2024-05-04: qty 28, 14d supply, fill #0

## 2024-05-17 ENCOUNTER — Other Ambulatory Visit: Payer: Self-pay

## 2024-05-17 ENCOUNTER — Emergency Department (HOSPITAL_BASED_OUTPATIENT_CLINIC_OR_DEPARTMENT_OTHER)
Admission: EM | Admit: 2024-05-17 | Discharge: 2024-05-17 | Disposition: A | Discharge status: Home / Self Care | Attending: Emergency Medicine | Admitting: Emergency Medicine

## 2024-05-17 ENCOUNTER — Encounter (HOSPITAL_BASED_OUTPATIENT_CLINIC_OR_DEPARTMENT_OTHER): Payer: Self-pay | Admitting: Emergency Medicine

## 2024-05-17 DIAGNOSIS — K649 Unspecified hemorrhoids: Secondary | ICD-10-CM

## 2024-05-17 DIAGNOSIS — K6289 Other specified diseases of anus and rectum: Secondary | ICD-10-CM | POA: Diagnosis not present

## 2024-05-17 LAB — CBC WITH DIFFERENTIAL/PLATELET
Abs Immature Granulocytes: 0.05 K/uL (ref 0.00–0.07)
Basophils Absolute: 0 K/uL (ref 0.0–0.1)
Basophils Relative: 0 %
Eosinophils Absolute: 0.2 K/uL (ref 0.0–0.5)
Eosinophils Relative: 2 %
HCT: 41 % (ref 36.0–46.0)
Hemoglobin: 13.5 g/dL (ref 12.0–15.0)
Immature Granulocytes: 1 %
Lymphocytes Relative: 24 %
Lymphs Abs: 2.5 K/uL (ref 0.7–4.0)
MCH: 27 pg (ref 26.0–34.0)
MCHC: 32.9 g/dL (ref 30.0–36.0)
MCV: 82 fL (ref 80.0–100.0)
Monocytes Absolute: 0.6 K/uL (ref 0.1–1.0)
Monocytes Relative: 6 %
Neutro Abs: 7 K/uL (ref 1.7–7.7)
Neutrophils Relative %: 67 %
Platelets: 339 K/uL (ref 150–400)
RBC: 5 MIL/uL (ref 3.87–5.11)
RDW: 15.5 % (ref 11.5–15.5)
WBC: 10.3 K/uL (ref 4.0–10.5)
nRBC: 0 % (ref 0.0–0.2)

## 2024-05-17 LAB — BASIC METABOLIC PANEL WITH GFR
Anion gap: 12 (ref 5–15)
BUN: 15 mg/dL (ref 6–20)
CO2: 25 mmol/L (ref 22–32)
Calcium: 10.3 mg/dL (ref 8.9–10.3)
Chloride: 100 mmol/L (ref 98–111)
Creatinine, Ser: 0.69 mg/dL (ref 0.44–1.00)
GFR, Estimated: >60 mL/min (ref >60)
Glucose, Bld: 94 mg/dL (ref 70–99)
Potassium: 4.4 mmol/L (ref 3.5–5.1)
Sodium: 137 mmol/L (ref 135–145)

## 2024-05-17 MED ORDER — PHENYLEPHRINE-MINERAL OIL-PET 0.25-14-74.9 % RE OINT
1.0000 | TOPICAL_OINTMENT | Freq: Two times a day (BID) | RECTAL | 0 refills | Status: AC | PRN
  Filled 2024-05-17: qty 28, fill #0
  Filled 2024-05-20: qty 28, 30d supply, fill #0

## 2024-05-17 MED ORDER — DIBUCAINE (PERIANAL) 1 % EX OINT
1.0000 | TOPICAL_OINTMENT | CUTANEOUS | 0 refills | Status: AC | PRN
  Filled 2024-05-17 – 2024-05-20 (×3): qty 28, 30d supply, fill #0

## 2024-05-17 NOTE — Discharge Instructions (Signed)
 You were seen for hemorrhoidal pain in the emergency department.   At home, please take the medications you have been prescribed.    Check your MyChart online for the results of any tests that had not resulted by the time you left the emergency department.   Follow-up with your primary doctor in 2-3 days regarding your visit.    Return immediately to the emergency department if you experience any of the following: worsening pain, fevers, or any other concerning symptoms.    Thank you for visiting our Emergency Department. It was a pleasure taking care of you today.

## 2024-05-17 NOTE — ED Triage Notes (Signed)
 7 weeks postpartum Rectal pain Treated for hemorrhoids still having pains Sharp  it feels like glass in my butt Worse when sitting Denies bleeding,  Normal BM with stool softeners   Was treated for UTI and feels like it started after uti

## 2024-05-17 NOTE — ED Provider Notes (Signed)
 [facility] EMERGENCY DEPARTMENT AT [facility] Provider Note   CSN: [id number] Arrival date & time: 05/17/24  8380     Patient presents with: Rectal Pain   [name] is a 38 y.o. female.  {Add pertinent medical, surgical, social history, OB history to HPI:5272} 38 year old female 7 weeks postpartum with a history of hemorrhoids presents with rectal pain.  Patient reports that she has been having hemorrhoidal pain for several weeks.  Feels like she has glass in her bottom.  Has been having bowel movements every day and is using stool softeners.  Has tried preparation H, lidocaine  spray, as well as Preparation H suppositories and is using suppositories.  Also is using hydrocortisone  suppositories.  Says that the swelling is gone down but the pain is persisted.       Prior to Admission medications  Medication Sig Start Date End Date Taking? Authorizing Provider  buPROPion  (WELLBUTRIN  XL) 150 MG 24 hr tablet Take 1 tablet (150 mg total) by mouth daily. 11/14/23     buPROPion  (WELLBUTRIN  XL) 150 MG 24 hr tablet Take 1 tablet (150 mg total) by mouth daily. 04/19/24     hydrocortisone  (ANUSOL -HC) 25 MG suppository Insert 1 suppository twice a day by rectal route for 14 days. 05/04/24     ibuprofen  (ADVIL ) 600 MG tablet Take 1 tablet (600 mg total) by mouth every 6 (six) hours as needed for moderate pain (pain score 4-6) or cramping. 03/28/24   Banga, Ted Morrison, DO  labetalol  (NORMODYNE ) 100 MG tablet Take 1 tablet (100 mg total) by mouth 3 (three) times daily. 04/05/24     labetalol  (NORMODYNE ) 300 MG tablet Take 1 tablet (300 mg total) by mouth 3 (three) times daily. 03/28/24   Banga, Ted Morrison, DO  lisdexamfetamine  (VYVANSE ) 30 MG capsule Take 1 capsule (30 mg total) by mouth in the morning. 04/19/24     sertraline  (ZOLOFT ) 100 MG tablet Take 2 tablets (200 mg total) by mouth daily. 02/23/24   Diedre Rosaline BRAVO, MD  sertraline  (ZOLOFT ) 100 MG tablet Take 1 tablet  (100 mg total) by mouth in the morning and at bedtime. 04/19/24       Allergies: Amoxicillin and Ceftin [cefuroxime]    Review of Systems  Updated Vital Signs BP (!) 121/50   Pulse 84   Temp 98.1 F (36.7 C)   Resp 20   LMP 05/03/2024 (Exact Date)   SpO2 98%   Breastfeeding No   Physical Exam Genitourinary:    Comments: Internal and external hemorrhoids on exam. No abscesses or masses palpated.  Chaperoned by NT caroline.     (all labs ordered are listed, but only abnormal results are displayed) Labs Reviewed  CBC WITH DIFFERENTIAL/PLATELET  BASIC METABOLIC PANEL WITH GFR    EKG: None  Radiology: No results found.  {Document cardiac monitor, telemetry assessment procedure when appropriate:32947} Procedures   Medications Ordered in the ED - No data to display    {Click here for ABCD2, HEART and other calculators REFRESH Note before signing:1}                              Medical Decision Making Amount and/or Complexity of Data Reviewed Labs: ordered.   ***  {Document critical care time when appropriate  Document review of labs and clinical decision tools ie CHADS2VASC2, etc  Document your independent review of radiology images and any outside records  Document your discussion with family  members, caretakers and with consultants  Document social determinants of health affecting pt's care  Document your decision making why or why not admission, treatments were needed:32947:::1}   Final diagnoses:  None    ED Discharge Orders     None

## 2024-05-18 ENCOUNTER — Other Ambulatory Visit (HOSPITAL_BASED_OUTPATIENT_CLINIC_OR_DEPARTMENT_OTHER): Payer: Self-pay

## 2024-05-19 ENCOUNTER — Encounter: Payer: Self-pay | Admitting: Gastroenterology

## 2024-05-19 ENCOUNTER — Ambulatory Visit: Admitting: Gastroenterology

## 2024-05-19 VITALS — BP 130/80 | HR 92 | Ht 66.0 in | Wt 250.0 lb

## 2024-05-19 DIAGNOSIS — K6289 Other specified diseases of anus and rectum: Secondary | ICD-10-CM

## 2024-05-19 DIAGNOSIS — K644 Residual hemorrhoidal skin tags: Secondary | ICD-10-CM | POA: Diagnosis not present

## 2024-05-19 DIAGNOSIS — K648 Other hemorrhoids: Secondary | ICD-10-CM

## 2024-05-19 DIAGNOSIS — K649 Unspecified hemorrhoids: Secondary | ICD-10-CM

## 2024-05-19 NOTE — Progress Notes (Signed)
 [name] [id number] 04-Apr-1986   Chief Complaint: Rectal pain  Referring Provider: Cleotilde Planas, MD Primary GI MD: Sampson  HPI: [name] is a 38 y.o. female with past medical history of anxiety/depression, Guillain-Barr syndrome 2009, HLD, pregnancy-induced hypertension, preeclampsia who presents today for a complaint of rectal pain.    Patient recently pregnant, delivery 03/26/2024.  Seen in the ED 05/17/2024 at which time she was 7 weeks postpartum, presented with history of hemorrhoids and rectal pain.  Was having bowel movements every day and using stool softeners.  Tried over-the-counter remedies without improvement in pain though swelling had reduced.  Internal and external hemorrhoids noted on exam. She was given a prescription for Dibucaine and Preparation H and advised to follow-up with PCP as well as GI for evaluation of potential banding.  Labs 05/17/2024: Normal CBC, normal BMP   Discussed the use of AI scribe software for clinical note transcription with the patient, who gave verbal consent to proceed.  History of Present Illness [name] is a 37 year old female who presents with postpartum rectal pain.  Postpartum rectal pain - Onset a few weeks postpartum - Pain described as 'glass-like' and internal, distinct from prior hemorrhoid pain - Worsens throughout the day, partially relieved by lying flat - Pain intensity rated 5-6/10, present daily but does not interrupt sleep - Pain prior to falling asleep managed with meditation and breathing techniques - No rectal tear during delivery; had second-degree perineal and urethral tears  Hemorrhoidal symptoms and management - History of hemorrhoids - Current use of hydrocortisone  suppositories since December 2nd, resulting in reduced hemorrhoidal swelling but persistent internal pain - Additional use of over-the-counter Tux pads - Has not picked up prescribed dibucaine or preparation H  yet  Bowel habits and constipation - Constipation during pregnancy, ongoing since [redacted] weeks gestation - Continued use of stool softener postpartum - No blood in stool - No significant pain during bowel movements, attributed to stool softener use  Irritable bowel syndrome (ibs) - Diagnosed with IBS in 2006 after colonoscopy and endoscopy, which revealed benign polyps with no follow up colonoscopy recommended - IBS symptoms managed through dietary modifications  Urinary tract infection (uti) postpartum - Developed UTI with significant back pain three weeks postpartum - Treated with Bactrim - Resolution of UTI symptoms; rectal pain became more prominent after UTI improvement   Previous GI Procedures/Imaging      Past Medical History:  Diagnosis Date   Depression with anxiety    Guillain-Barre 12/2007   Hyperlipemia    Migraine    Pre-eclampsia    Pregnancy induced hypertension    Vertigo     Past Surgical History:  Procedure Laterality Date   ANKLE SURGERY Left     Current Outpatient Medications  Medication Sig Dispense Refill   buPROPion  (WELLBUTRIN  XL) 150 MG 24 hr tablet Take 1 tablet (150 mg total) by mouth daily. 90 tablet 1   dibucaine (NUPERCAINAL) 1 % OINT Place 1 Application rectally as needed for hemorrhoids. (Patient taking differently: Place 1 Application rectally as needed for hemorrhoids. Pt  states she has no started this Rx) 28 g 0   hydrocortisone  (ANUSOL -HC) 25 MG suppository Insert 1 suppository twice a day by rectal route for 14 days. 28 suppository 0   hydrocortisone  cream 1 % Apply 1 Application topically 2 (two) times daily.     ibuprofen  (ADVIL ) 600 MG tablet Take 1 tablet (600 mg total) by mouth every 6 (six) hours as needed  for moderate pain (pain score 4-6) or cramping. 40 tablet 1   lisdexamfetamine  (VYVANSE ) 30 MG capsule Take 1 capsule (30 mg total) by mouth in the morning. 30 capsule 0   phenylephrine -shark liver oil-mineral oil-petrolatum   (PREPARATION H) 0.25-14-74.9 % rectal ointment Place 1 Application rectally 2 (two) times daily as needed for hemorrhoids. 28 g 0   sertraline  (ZOLOFT ) 100 MG tablet Take 2 tablets (200 mg total) by mouth daily. 40 tablet 1   buPROPion  (WELLBUTRIN  XL) 150 MG 24 hr tablet Take 1 tablet (150 mg total) by mouth daily. (Patient not taking: Reported on 05/19/2024) 90 tablet 1   labetalol  (NORMODYNE ) 100 MG tablet Take 1 tablet (100 mg total) by mouth 3 (three) times daily. (Patient not taking: Reported on 05/19/2024) 270 tablet 0   labetalol  (NORMODYNE ) 300 MG tablet Take 1 tablet (300 mg total) by mouth 3 (three) times daily. (Patient not taking: Reported on 05/19/2024) 90 tablet 1   sertraline  (ZOLOFT ) 100 MG tablet Take 1 tablet (100 mg total) by mouth in the morning and at bedtime. (Patient not taking: Reported on 05/19/2024) 180 tablet 1   No current facility-administered medications for this visit.    Allergies as of 05/19/2024 - Review Complete 05/19/2024  Allergen Reaction Noted   Amoxicillin Other (See Comments) 07/25/2016   Ceftin [cefuroxime] Other (See Comments) 07/22/2022    Family History  Problem Relation Age of Onset   Hyperlipidemia Mother    Breast cancer Mother    Hyperlipidemia Father    Melanoma Father    Depression Father    Anxiety disorder Father    Dementia Father    Diabetes Maternal Grandmother    Bipolar disorder Niece    ADD / ADHD Niece    Anxiety disorder Sister    Depression Sister    Anxiety disorder Half-Sister    Depression Half-Sister    ADD / ADHD Half-Sister     Social History[1]   Review of Systems:    Constitutional: No unintentional weight loss, fever, chills Cardiovascular: No chest pain Respiratory: No SOB Gastrointestinal: See HPI and otherwise negative   Physical Exam:  Vital signs: BP 130/80   Pulse 92   Ht 5' 6 (1.676 m)   Wt 250 lb (113.4 kg)   LMP 05/03/2024 (Exact Date)   BMI 40.35 kg/m   Constitutional: Pleasant,  obese female in NAD, alert and cooperative Head:  Normocephalic and atraumatic.  Respiratory: Respirations even and unlabored.  Cardiovascular:  No peripheral edema. Gastrointestinal:  Soft, nondistended, nontender.  Rectal:  Multiple nonbleeding, nonthrombosed external hemorrhoids/skin tags as well as internal hemorrhoids.  No visible bleeding.  No obvious anal fissures.  Sharp pain is reproducible on DRE.  Anoscopy not attempted.  Chaperone present for exam. Neurologic:  Alert and oriented x4;  grossly normal neurologically.  Skin:   Dry and intact without significant lesions or rashes. Psychiatric: Oriented to person, place and time. Demonstrates good judgement and reason without abnormal affect or behaviors.   RELEVANT LABS AND IMAGING: CBC    Component Value Date/Time   WBC 10.3 05/17/2024 1632   RBC 5.00 05/17/2024 1632   HGB 13.5 05/17/2024 1632   HGB 13.4 11/19/2019 1422   HCT 41.0 05/17/2024 1632   PLT 339 05/17/2024 1632   PLT 320 11/19/2019 1422   MCV 82.0 05/17/2024 1632   MCH 27.0 05/17/2024 1632   MCHC 32.9 05/17/2024 1632   RDW 15.5 05/17/2024 1632   LYMPHSABS 2.5 05/17/2024 1632   MONOABS 0.6  05/17/2024 1632   EOSABS 0.2 05/17/2024 1632   BASOSABS 0.0 05/17/2024 1632    CMP     Component Value Date/Time   NA 137 05/17/2024 1632   K 4.4 05/17/2024 1632   CL 100 05/17/2024 1632   CO2 25 05/17/2024 1632   GLUCOSE 94 05/17/2024 1632   BUN 15 05/17/2024 1632   CREATININE 0.69 05/17/2024 1632   CREATININE 0.82 11/19/2019 1422   CALCIUM  10.3 05/17/2024 1632   PROT 7.0 03/25/2024 1928   ALBUMIN 2.8 (L) 03/25/2024 1928   AST 23 03/25/2024 1928   AST 29 11/19/2019 1422   ALT 18 03/25/2024 1928   ALT 42 11/19/2019 1422   ALKPHOS 93 03/25/2024 1928   BILITOT 0.5 03/25/2024 1928   BILITOT 0.4 11/19/2019 1422   GFRNONAA >60 05/17/2024 1632   GFRNONAA >60 11/19/2019 1422   GFRAA >60 11/19/2019 1422     Assessment/Plan:   Assessment & Plan Suspected anal  fissure  Internal and external hemorrhoids Rectal pain Seven weeks postpartum with sharp anal pain, atypical for hemorrhoids which she has dealt with in the past, suggesting possible anal fissure. No rectal bleeding or painful bowel movements. Differential includes anal fissure versus hemorrhoidal pain.   On exam has multiple nonbleeding, nonthrombosed external hemorrhoids as well as internal hemorrhoids.  No visible bleeding.  No obvious anal fissures.  Sharp pain is reproducible on DRE.  Anoscopy not attempted. Has had improvement in swelling with use of suppositories but no improvement in pain. Symptoms are new since childbirth 7 weeks ago. No prior similar pain, but has had hemorrhoid issues before and this feels different. Was having some constipation previously and has since been using stool softeners.  - Will treat empirically for suspected anal fissure, send topical diltiazem/lidocaine  cream, apply 3 times daily for 6 to 8 weeks - Will have patient follow-up in 3-4 weeks - Consider flexible sigmoidoscopy if no improvement. - Consider referral to surgery -Advised sitz bath's, avoidance of prolonged sitting, continued use of stool softener, etc. - Attempt to obtain previous colonoscopy records from [facility] in [place] ([facility]), procedures done in 2006   Camie Furbish, PA-C [facility] 05/19/2024, 11:09 AM  Patient Care Team: Cleotilde Planas, MD as PCP - General (Family Medicine) Claire Rubie LABOR, MD as PCP - OBGYN (Obstetrics and Gynecology) Benay Kay, PA-C as Physician Assistant (Physician Assistant)       [1]  Social History Tobacco Use   Smoking status: Never   Smokeless tobacco: Never  Vaping Use   Vaping status: Never Used  Substance Use Topics   Alcohol use: Not Currently    Comment: 1-2 drinks per month   Drug use: No

## 2024-05-19 NOTE — Patient Instructions (Addendum)
 Diltiazem 2%/ Lidocaine  5% Apply pea size amount 1/2 to 1 inch inside rectum 3 times daily for 6-8 weeks   We have sent a prescription for Diltiazem/ Lidocaine  gel to [facility]. Apply a pea size amount 1/2 to 1 inch inside rectum 3 times daily for 6-8 weeks   [facility] information is below: Address: [address] [place], [place] [address]  [phone]  *Please DO NOT go directly from our office to pick up this medication! Give the pharmacy 1 day to process the prescription as this is compounded and takes time to make.  Follow-up on : 06/25/24 at 11:00 am with Camie Furbish, PA-C   _______________________________________________________  If your blood pressure at your visit was 140/90 or greater, please contact your primary care physician to follow up on this.  _______________________________________________________  If you are age 5 or older, your body mass index should be between 23-30. Your Body mass index is 40.35 kg/m. If this is out of the aforementioned range listed, please consider follow up with your Primary Care Provider.  If you are age 64 or younger, your body mass index should be between 19-25. Your Body mass index is 40.35 kg/m. If this is out of the aformentioned range listed, please consider follow up with your Primary Care Provider.   ________________________________________________________  The [place] [facility] providers would like to encourage you to use MYCHART to communicate with providers for non-urgent requests or questions.  Due to long hold times on the telephone, sending your provider a message by [facility] may be a faster and more efficient way to get a response.  Please allow 48 business hours for a response.  Please remember that this is for non-urgent requests.  _______________________________________________________  [facility] is using a team-based approach to care.  Your team is made up of your doctor and two to three  APPS. Our APPS (Nurse Practitioners and Physician Assistants) work with your physician to ensure care continuity for you. They are fully qualified to address your health concerns and develop a treatment plan. They communicate directly with your gastroenterologist to care for you. Seeing the Advanced Practice Practitioners on your physician's team can help you by facilitating care more promptly, often allowing for earlier appointments, access to diagnostic testing, procedures, and other specialty referrals.   Thank you for choosing me and [facility].  Camie Furbish, PA-C

## 2024-05-20 ENCOUNTER — Other Ambulatory Visit (HOSPITAL_BASED_OUTPATIENT_CLINIC_OR_DEPARTMENT_OTHER): Payer: Self-pay

## 2024-05-20 MED ORDER — LISDEXAMFETAMINE DIMESYLATE 30 MG PO CAPS
30.0000 mg | ORAL_CAPSULE | Freq: Every morning | ORAL | 0 refills | Status: DC
  Filled 2024-05-20: qty 30, 30d supply, fill #0

## 2024-05-21 ENCOUNTER — Other Ambulatory Visit (HOSPITAL_BASED_OUTPATIENT_CLINIC_OR_DEPARTMENT_OTHER): Payer: Self-pay

## 2024-05-24 DIAGNOSIS — F902 Attention-deficit hyperactivity disorder, combined type: Secondary | ICD-10-CM | POA: Diagnosis not present

## 2024-05-24 DIAGNOSIS — E782 Mixed hyperlipidemia: Secondary | ICD-10-CM | POA: Diagnosis not present

## 2024-05-24 DIAGNOSIS — R7303 Prediabetes: Secondary | ICD-10-CM | POA: Diagnosis not present

## 2024-05-24 DIAGNOSIS — O135 Gestational [pregnancy-induced] hypertension without significant proteinuria, complicating the puerperium: Secondary | ICD-10-CM | POA: Diagnosis not present

## 2024-05-31 ENCOUNTER — Other Ambulatory Visit (HOSPITAL_BASED_OUTPATIENT_CLINIC_OR_DEPARTMENT_OTHER): Payer: Self-pay

## 2024-05-31 ENCOUNTER — Other Ambulatory Visit: Payer: Self-pay

## 2024-05-31 DIAGNOSIS — F909 Attention-deficit hyperactivity disorder, unspecified type: Secondary | ICD-10-CM | POA: Diagnosis not present

## 2024-05-31 DIAGNOSIS — F411 Generalized anxiety disorder: Secondary | ICD-10-CM | POA: Diagnosis not present

## 2024-05-31 DIAGNOSIS — F3342 Major depressive disorder, recurrent, in full remission: Secondary | ICD-10-CM | POA: Diagnosis not present

## 2024-05-31 MED ORDER — AMPHETAMINE-DEXTROAMPHETAMINE 10 MG PO TABS: 5.0000 mg | ORAL_TABLET | Freq: Every day | ORAL | 0 refills | Status: AC | PRN

## 2024-05-31 MED ORDER — LISDEXAMFETAMINE DIMESYLATE 40 MG PO CAPS: 40.0000 mg | ORAL_CAPSULE | Freq: Every day | ORAL | 0 refills | Status: AC

## 2024-05-31 MED ORDER — LISDEXAMFETAMINE DIMESYLATE 40 MG PO CAPS
40.0000 mg | ORAL_CAPSULE | Freq: Every day | ORAL | 0 refills | Status: AC
  Filled 2024-05-31: qty 30, 30d supply, fill #0

## 2024-05-31 MED ORDER — SERTRALINE HCL 100 MG PO TABS
100.0000 mg | ORAL_TABLET | Freq: Two times a day (BID) | ORAL | 1 refills | Status: AC
  Filled 2024-05-31: qty 180, 90d supply, fill #0

## 2024-05-31 MED ORDER — LISDEXAMFETAMINE DIMESYLATE 40 MG PO CAPS
40.0000 mg | ORAL_CAPSULE | Freq: Every day | ORAL | 0 refills | Status: AC
  Filled 2024-07-02: qty 30, 30d supply, fill #0

## 2024-05-31 MED ORDER — AMPHETAMINE-DEXTROAMPHETAMINE 10 MG PO TABS
5.0000 mg | ORAL_TABLET | Freq: Every day | ORAL | 0 refills | Status: AC | PRN
  Filled 2024-05-31: qty 30, 30d supply, fill #0

## 2024-05-31 MED ORDER — BUPROPION HCL ER (XL) 150 MG PO TB24
150.0000 mg | ORAL_TABLET | Freq: Every day | ORAL | 1 refills | Status: AC
  Filled 2024-05-31: qty 90, 90d supply, fill #0

## 2024-06-14 ENCOUNTER — Other Ambulatory Visit (HOSPITAL_BASED_OUTPATIENT_CLINIC_OR_DEPARTMENT_OTHER): Payer: Self-pay

## 2024-06-14 MED ORDER — PROMETHAZINE HCL 12.5 MG PO TABS
12.5000 mg | ORAL_TABLET | Freq: Four times a day (QID) | ORAL | 3 refills | Status: AC | PRN
  Filled 2024-06-14: qty 30, 8d supply, fill #0

## 2024-06-14 MED ORDER — ZOLMITRIPTAN 5 MG PO TABS
5.0000 mg | ORAL_TABLET | Freq: Every day | ORAL | 3 refills | Status: AC | PRN
  Filled 2024-06-14: qty 10, 25d supply, fill #0

## 2024-06-14 MED ORDER — TIZANIDINE HCL 4 MG PO TABS
4.0000 mg | ORAL_TABLET | Freq: Three times a day (TID) | ORAL | 3 refills | Status: AC | PRN
  Filled 2024-06-14: qty 90, 30d supply, fill #0

## 2024-06-14 MED ORDER — NURTEC 75 MG PO TBDP
75.0000 mg | ORAL_TABLET | Freq: Every day | ORAL | 3 refills | Status: AC | PRN
  Filled 2024-06-14: qty 16, 30d supply, fill #0

## 2024-06-21 ENCOUNTER — Other Ambulatory Visit (HOSPITAL_BASED_OUTPATIENT_CLINIC_OR_DEPARTMENT_OTHER): Payer: Self-pay

## 2024-06-25 ENCOUNTER — Ambulatory Visit: Admitting: Gastroenterology

## 2024-06-28 ENCOUNTER — Ambulatory Visit: Admitting: Gastroenterology

## 2024-06-30 ENCOUNTER — Other Ambulatory Visit (HOSPITAL_BASED_OUTPATIENT_CLINIC_OR_DEPARTMENT_OTHER): Payer: Self-pay

## 2024-07-02 ENCOUNTER — Other Ambulatory Visit (HOSPITAL_BASED_OUTPATIENT_CLINIC_OR_DEPARTMENT_OTHER): Payer: Self-pay

## 2024-07-20 ENCOUNTER — Ambulatory Visit: Admitting: Gastroenterology
